# Patient Record
Sex: Male | Born: 1945 | Race: White | Hispanic: No | Marital: Married | Smoking: Former smoker
Health system: Southern US, Community
[De-identification: ages and names within clinical notes are randomized; demographics above are authoritative.]

## PROBLEM LIST (undated history)

## (undated) DIAGNOSIS — I219 Acute myocardial infarction, unspecified: Secondary | ICD-10-CM

## (undated) DIAGNOSIS — I5022 Chronic systolic (congestive) heart failure: Secondary | ICD-10-CM

## (undated) DIAGNOSIS — C8290 Follicular lymphoma, unspecified, unspecified site: Secondary | ICD-10-CM

## (undated) DIAGNOSIS — I2510 Atherosclerotic heart disease of native coronary artery without angina pectoris: Secondary | ICD-10-CM

## (undated) DIAGNOSIS — E785 Hyperlipidemia, unspecified: Secondary | ICD-10-CM

## (undated) DIAGNOSIS — I10 Essential (primary) hypertension: Secondary | ICD-10-CM

## (undated) DIAGNOSIS — I255 Ischemic cardiomyopathy: Secondary | ICD-10-CM

## (undated) HISTORY — PX: HERNIA REPAIR: SHX51

---
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
## 1990-12-07 DIAGNOSIS — I219 Acute myocardial infarction, unspecified: Secondary | ICD-10-CM

## 1990-12-07 HISTORY — DX: Acute myocardial infarction, unspecified: I21.9

## 2009-05-07 ENCOUNTER — Ambulatory Visit: Payer: Self-pay | Admitting: Oncology

## 2009-05-15 ENCOUNTER — Ambulatory Visit: Payer: Self-pay | Admitting: Oncology

## 2009-06-06 ENCOUNTER — Ambulatory Visit: Payer: Self-pay | Admitting: Oncology

## 2009-07-07 ENCOUNTER — Ambulatory Visit: Payer: Self-pay | Admitting: Oncology

## 2009-08-07 ENCOUNTER — Ambulatory Visit: Payer: Self-pay | Admitting: Oncology

## 2009-09-06 ENCOUNTER — Ambulatory Visit: Payer: Self-pay | Admitting: Oncology

## 2009-10-07 ENCOUNTER — Ambulatory Visit: Payer: Self-pay | Admitting: Oncology

## 2009-11-06 ENCOUNTER — Ambulatory Visit: Payer: Self-pay | Admitting: Oncology

## 2009-12-07 ENCOUNTER — Ambulatory Visit: Payer: Self-pay | Admitting: Oncology

## 2009-12-25 ENCOUNTER — Ambulatory Visit: Payer: Self-pay | Admitting: Oncology

## 2010-01-07 ENCOUNTER — Ambulatory Visit: Payer: Self-pay | Admitting: Oncology

## 2010-02-04 ENCOUNTER — Ambulatory Visit: Payer: Self-pay | Admitting: Oncology

## 2010-02-19 ENCOUNTER — Ambulatory Visit: Payer: Self-pay | Admitting: Oncology

## 2010-03-07 ENCOUNTER — Ambulatory Visit: Payer: Self-pay | Admitting: Oncology

## 2010-04-06 ENCOUNTER — Ambulatory Visit: Payer: Self-pay | Admitting: Oncology

## 2010-04-16 ENCOUNTER — Ambulatory Visit: Payer: Self-pay | Admitting: Oncology

## 2010-05-07 ENCOUNTER — Ambulatory Visit: Payer: Self-pay | Admitting: Oncology

## 2010-06-06 ENCOUNTER — Ambulatory Visit: Payer: Self-pay | Admitting: Oncology

## 2010-06-11 ENCOUNTER — Ambulatory Visit: Payer: Self-pay | Admitting: Oncology

## 2010-07-07 ENCOUNTER — Ambulatory Visit: Payer: Self-pay | Admitting: Oncology

## 2010-08-07 ENCOUNTER — Ambulatory Visit: Payer: Self-pay | Admitting: Oncology

## 2010-09-30 ENCOUNTER — Ambulatory Visit: Payer: Self-pay | Admitting: Oncology

## 2010-10-07 ENCOUNTER — Ambulatory Visit: Payer: Self-pay | Admitting: Oncology

## 2010-11-25 ENCOUNTER — Ambulatory Visit: Payer: Self-pay | Admitting: Oncology

## 2010-12-07 ENCOUNTER — Ambulatory Visit: Payer: Self-pay | Admitting: Oncology

## 2011-02-03 ENCOUNTER — Ambulatory Visit: Payer: Self-pay | Admitting: Oncology

## 2011-02-05 ENCOUNTER — Ambulatory Visit: Payer: Self-pay | Admitting: Oncology

## 2011-03-31 ENCOUNTER — Ambulatory Visit: Payer: Self-pay | Admitting: Oncology

## 2011-04-07 ENCOUNTER — Ambulatory Visit: Payer: Self-pay | Admitting: Oncology

## 2011-05-26 ENCOUNTER — Ambulatory Visit: Payer: Self-pay | Admitting: Oncology

## 2011-06-07 ENCOUNTER — Ambulatory Visit: Payer: Self-pay | Admitting: Oncology

## 2011-07-21 ENCOUNTER — Ambulatory Visit: Payer: Self-pay | Admitting: Oncology

## 2011-08-08 ENCOUNTER — Ambulatory Visit: Payer: Self-pay | Admitting: Oncology

## 2011-09-21 ENCOUNTER — Ambulatory Visit: Payer: Self-pay | Admitting: Oncology

## 2011-10-08 ENCOUNTER — Ambulatory Visit: Payer: Self-pay | Admitting: Oncology

## 2011-10-26 ENCOUNTER — Ambulatory Visit: Payer: Self-pay | Admitting: Oncology

## 2011-11-07 ENCOUNTER — Ambulatory Visit: Payer: Self-pay | Admitting: Oncology

## 2012-02-01 ENCOUNTER — Ambulatory Visit: Payer: Self-pay | Admitting: Oncology

## 2012-02-01 LAB — CBC CANCER CENTER
Basophil #: 0.1 x10 3/mm (ref 0.0–0.1)
Eosinophil #: 0.4 x10 3/mm (ref 0.0–0.7)
HCT: 46.7 % (ref 40.0–52.0)
Lymphocyte %: 21 %
MCH: 31.3 pg (ref 26.0–34.0)
Monocyte #: 0.9 x10 3/mm — ABNORMAL HIGH (ref 0.0–0.7)
Monocyte %: 10.4 %
Neutrophil #: 5.5 x10 3/mm (ref 1.4–6.5)
Neutrophil %: 63 %
Platelet: 206 x10 3/mm (ref 150–440)
RBC: 5.09 10*6/uL (ref 4.40–5.90)
RDW: 13.9 % (ref 11.5–14.5)

## 2012-02-01 LAB — COMPREHENSIVE METABOLIC PANEL
Albumin: 3.6 g/dL (ref 3.4–5.0)
Alkaline Phosphatase: 120 U/L (ref 50–136)
Anion Gap: 8 (ref 7–16)
Calcium, Total: 8.7 mg/dL (ref 8.5–10.1)
Co2: 30 mmol/L (ref 21–32)
EGFR (African American): >60
Glucose: 120 mg/dL — ABNORMAL HIGH (ref 65–99)
Osmolality: 286 (ref 275–301)
SGOT(AST): 14 U/L — ABNORMAL LOW (ref 15–37)
SGPT (ALT): 19 U/L

## 2012-02-01 LAB — SEDIMENTATION RATE: Erythrocyte Sed Rate: 5 mm/hr (ref 0–20)

## 2012-02-05 ENCOUNTER — Ambulatory Visit: Payer: Self-pay | Admitting: Oncology

## 2012-06-06 ENCOUNTER — Ambulatory Visit: Payer: Self-pay | Admitting: Oncology

## 2012-06-08 ENCOUNTER — Ambulatory Visit: Payer: Self-pay | Admitting: Oncology

## 2012-06-08 LAB — CBC CANCER CENTER
Basophil %: 1.2 %
Eosinophil %: 3.5 %
HGB: 15.4 g/dL (ref 13.0–18.0)
Lymphocyte %: 26.2 %
MCH: 30.5 pg (ref 26.0–34.0)
MCHC: 32.7 g/dL (ref 32.0–36.0)
Monocyte #: 1.1 x10 3/mm — ABNORMAL HIGH (ref 0.2–1.0)
Neutrophil %: 57.9 %
Platelet: 202 x10 3/mm (ref 150–440)
RDW: 14.1 % (ref 11.5–14.5)
WBC: 9.7 x10 3/mm (ref 3.8–10.6)

## 2012-06-08 LAB — COMPREHENSIVE METABOLIC PANEL
Albumin: 3.8 g/dL (ref 3.4–5.0)
Alkaline Phosphatase: 121 U/L (ref 50–136)
Anion Gap: 9 (ref 7–16)
Calcium, Total: 9.2 mg/dL (ref 8.5–10.1)
Glucose: 93 mg/dL (ref 65–99)
SGOT(AST): 14 U/L — ABNORMAL LOW (ref 15–37)
SGPT (ALT): 19 U/L
Sodium: 140 mmol/L (ref 136–145)
Total Protein: 7.1 g/dL (ref 6.4–8.2)

## 2012-06-08 LAB — LACTATE DEHYDROGENASE: LDH: 154 U/L (ref 87–241)

## 2012-07-07 ENCOUNTER — Ambulatory Visit: Payer: Self-pay | Admitting: Oncology

## 2012-10-10 ENCOUNTER — Ambulatory Visit: Payer: Self-pay | Admitting: Oncology

## 2012-10-10 LAB — COMPREHENSIVE METABOLIC PANEL
Alkaline Phosphatase: 125 U/L (ref 50–136)
BUN: 14 mg/dL (ref 7–18)
Bilirubin,Total: 0.5 mg/dL (ref 0.2–1.0)
Co2: 27 mmol/L (ref 21–32)
Creatinine: 1.2 mg/dL (ref 0.60–1.30)
EGFR (African American): >60
EGFR (Non-African Amer.): >60
Glucose: 96 mg/dL (ref 65–99)
Osmolality: 282 (ref 275–301)
Potassium: 4.4 mmol/L (ref 3.5–5.1)
SGPT (ALT): 18 U/L (ref 12–78)
Sodium: 141 mmol/L (ref 136–145)

## 2012-10-10 LAB — CBC CANCER CENTER
Basophil #: 0.1 x10 3/mm (ref 0.0–0.1)
Eosinophil #: 0.3 x10 3/mm (ref 0.0–0.7)
Lymphocyte #: 2.2 x10 3/mm (ref 1.0–3.6)
MCH: 31.1 pg (ref 26.0–34.0)
MCHC: 33.2 g/dL (ref 32.0–36.0)
MCV: 94 fL (ref 80–100)
Monocyte #: 1.2 x10 3/mm — ABNORMAL HIGH (ref 0.2–1.0)
Monocyte %: 13 %
Neutrophil #: 5.1 x10 3/mm (ref 1.4–6.5)
Neutrophil %: 57.4 %
Platelet: 194 x10 3/mm (ref 150–440)
RBC: 5.08 10*6/uL (ref 4.40–5.90)
RDW: 13.6 % (ref 11.5–14.5)
WBC: 9 x10 3/mm (ref 3.8–10.6)

## 2012-10-10 LAB — SEDIMENTATION RATE: Erythrocyte Sed Rate: 4 mm/hr (ref 0–20)

## 2012-11-06 ENCOUNTER — Ambulatory Visit: Payer: Self-pay | Admitting: Oncology

## 2013-04-03 ENCOUNTER — Ambulatory Visit: Payer: Self-pay | Admitting: Oncology

## 2013-04-06 ENCOUNTER — Ambulatory Visit: Payer: Self-pay | Admitting: Oncology

## 2013-04-10 LAB — CBC CANCER CENTER
Eosinophil #: 0.3 x10 3/mm (ref 0.0–0.7)
HCT: 46.8 % (ref 40.0–52.0)
HGB: 15.6 g/dL (ref 13.0–18.0)
MCHC: 33.3 g/dL (ref 32.0–36.0)
MCV: 90 fL (ref 80–100)
Monocyte #: 0.8 x10 3/mm (ref 0.2–1.0)
Monocyte %: 8.4 %
Neutrophil #: 5.7 x10 3/mm (ref 1.4–6.5)
Neutrophil %: 63.9 %
RBC: 5.19 10*6/uL (ref 4.40–5.90)
WBC: 8.9 x10 3/mm (ref 3.8–10.6)

## 2013-04-10 LAB — COMPREHENSIVE METABOLIC PANEL
Albumin: 3.8 g/dL (ref 3.4–5.0)
Alkaline Phosphatase: 111 U/L (ref 50–136)
Anion Gap: 12 (ref 7–16)
BUN: 13 mg/dL (ref 7–18)
Bilirubin,Total: 0.5 mg/dL (ref 0.2–1.0)
Calcium, Total: 9 mg/dL (ref 8.5–10.1)
Chloride: 102 mmol/L (ref 98–107)
Creatinine: 1.11 mg/dL (ref 0.60–1.30)
Glucose: 89 mg/dL (ref 65–99)
Potassium: 3.9 mmol/L (ref 3.5–5.1)
SGOT(AST): 11 U/L — ABNORMAL LOW (ref 15–37)
SGPT (ALT): 15 U/L (ref 12–78)
Total Protein: 6.7 g/dL (ref 6.4–8.2)

## 2013-04-10 LAB — SEDIMENTATION RATE: Erythrocyte Sed Rate: 4 mm/hr (ref 0–20)

## 2013-05-07 ENCOUNTER — Ambulatory Visit: Payer: Self-pay | Admitting: Oncology

## 2013-08-14 ENCOUNTER — Ambulatory Visit: Payer: Self-pay | Admitting: Oncology

## 2013-08-14 LAB — CBC CANCER CENTER
Basophil %: 1.1 %
Eosinophil #: 0.4 x10 3/mm (ref 0.0–0.7)
Eosinophil %: 4.3 %
HCT: 42.1 % (ref 40.0–52.0)
HGB: 14.5 g/dL (ref 13.0–18.0)
Lymphocyte #: 2.4 x10 3/mm (ref 1.0–3.6)
MCV: 91 fL (ref 80–100)
Monocyte #: 0.8 x10 3/mm (ref 0.2–1.0)
Monocyte %: 9.2 %
Neutrophil #: 5.3 x10 3/mm (ref 1.4–6.5)
Neutrophil %: 59.1 %
Platelet: 185 x10 3/mm (ref 150–440)
RDW: 14.2 % (ref 11.5–14.5)
WBC: 8.9 x10 3/mm (ref 3.8–10.6)

## 2013-08-14 LAB — COMPREHENSIVE METABOLIC PANEL
Albumin: 3.8 g/dL (ref 3.4–5.0)
BUN: 14 mg/dL (ref 7–18)
Chloride: 103 mmol/L (ref 98–107)
Glucose: 90 mg/dL (ref 65–99)
Potassium: 3.4 mmol/L — ABNORMAL LOW (ref 3.5–5.1)
SGOT(AST): 12 U/L — ABNORMAL LOW (ref 15–37)
SGPT (ALT): 14 U/L (ref 12–78)
Sodium: 141 mmol/L (ref 136–145)

## 2013-09-06 ENCOUNTER — Ambulatory Visit: Payer: Self-pay | Admitting: Oncology

## 2014-02-12 ENCOUNTER — Ambulatory Visit: Payer: Self-pay | Admitting: Oncology

## 2014-02-16 ENCOUNTER — Ambulatory Visit: Payer: Self-pay | Admitting: Oncology

## 2014-02-19 LAB — COMPREHENSIVE METABOLIC PANEL
ALK PHOS: 96 U/L
Albumin: 3.7 g/dL (ref 3.4–5.0)
Anion Gap: 3 — ABNORMAL LOW (ref 7–16)
BILIRUBIN TOTAL: 0.4 mg/dL (ref 0.2–1.0)
BUN: 12 mg/dL (ref 7–18)
CO2: 29 mmol/L (ref 21–32)
Calcium, Total: 8.9 mg/dL (ref 8.5–10.1)
Chloride: 105 mmol/L (ref 98–107)
Creatinine: 1.19 mg/dL (ref 0.60–1.30)
EGFR (African American): >60
Glucose: 94 mg/dL (ref 65–99)
OSMOLALITY: 273 (ref 275–301)
Potassium: 4.1 mmol/L (ref 3.5–5.1)
SGOT(AST): 11 U/L — ABNORMAL LOW (ref 15–37)
SGPT (ALT): 14 U/L (ref 12–78)
Sodium: 137 mmol/L (ref 136–145)
Total Protein: 6.9 g/dL (ref 6.4–8.2)

## 2014-02-19 LAB — CBC CANCER CENTER
BASOS ABS: 0.1 x10 3/mm (ref 0.0–0.1)
BASOS PCT: 1.1 %
EOS ABS: 0.4 x10 3/mm (ref 0.0–0.7)
Eosinophil %: 4.3 %
HCT: 44.3 % (ref 40.0–52.0)
HGB: 14.7 g/dL (ref 13.0–18.0)
LYMPHS PCT: 24.4 %
Lymphocyte #: 2.5 x10 3/mm (ref 1.0–3.6)
MCH: 29.4 pg (ref 26.0–34.0)
MCHC: 33.2 g/dL (ref 32.0–36.0)
MCV: 89 fL (ref 80–100)
Monocyte #: 1 x10 3/mm (ref 0.2–1.0)
Monocyte %: 9.3 %
NEUTROS ABS: 6.3 x10 3/mm (ref 1.4–6.5)
NEUTROS PCT: 60.9 %
Platelet: 201 x10 3/mm (ref 150–440)
RBC: 4.99 10*6/uL (ref 4.40–5.90)
RDW: 14.4 % (ref 11.5–14.5)
WBC: 10.3 x10 3/mm (ref 3.8–10.6)

## 2014-02-19 LAB — LACTATE DEHYDROGENASE: LDH: 165 U/L (ref 85–241)

## 2014-03-07 ENCOUNTER — Ambulatory Visit: Payer: Self-pay | Admitting: Oncology

## 2014-07-02 ENCOUNTER — Ambulatory Visit: Payer: Self-pay | Admitting: Oncology

## 2014-07-02 LAB — CBC CANCER CENTER
Basophil #: 0.2 x10 3/mm — ABNORMAL HIGH (ref 0.0–0.1)
Basophil %: 1.5 %
Eosinophil #: 0.3 x10 3/mm (ref 0.0–0.7)
Eosinophil %: 2.6 %
HCT: 41.5 % (ref 40.0–52.0)
HGB: 13.8 g/dL (ref 13.0–18.0)
LYMPHS ABS: 2.4 x10 3/mm (ref 1.0–3.6)
Lymphocyte %: 20.9 %
MCH: 28.8 pg (ref 26.0–34.0)
MCHC: 33.1 g/dL (ref 32.0–36.0)
MCV: 87 fL (ref 80–100)
Monocyte #: 1.2 x10 3/mm — ABNORMAL HIGH (ref 0.2–1.0)
Monocyte %: 10.2 %
Neutrophil #: 7.5 x10 3/mm — ABNORMAL HIGH (ref 1.4–6.5)
Neutrophil %: 64.8 %
Platelet: 219 x10 3/mm (ref 150–440)
RBC: 4.78 10*6/uL (ref 4.40–5.90)
RDW: 14.7 % — ABNORMAL HIGH (ref 11.5–14.5)
WBC: 11.6 x10 3/mm — ABNORMAL HIGH (ref 3.8–10.6)

## 2014-07-02 LAB — COMPREHENSIVE METABOLIC PANEL
ALBUMIN: 3.3 g/dL — AB (ref 3.4–5.0)
ALK PHOS: 99 U/L
ANION GAP: 11 (ref 7–16)
BILIRUBIN TOTAL: 0.5 mg/dL (ref 0.2–1.0)
BUN: 14 mg/dL (ref 7–18)
CO2: 27 mmol/L (ref 21–32)
Calcium, Total: 8.8 mg/dL (ref 8.5–10.1)
Chloride: 102 mmol/L (ref 98–107)
Creatinine: 1.08 mg/dL (ref 0.60–1.30)
EGFR (Non-African Amer.): >60
GLUCOSE: 94 mg/dL (ref 65–99)
Osmolality: 280 (ref 275–301)
Potassium: 3.7 mmol/L (ref 3.5–5.1)
SGOT(AST): 9 U/L — ABNORMAL LOW (ref 15–37)
SGPT (ALT): 14 U/L
Sodium: 140 mmol/L (ref 136–145)
Total Protein: 6.6 g/dL (ref 6.4–8.2)

## 2014-07-02 LAB — LACTATE DEHYDROGENASE: LDH: 126 U/L (ref 85–241)

## 2014-07-07 ENCOUNTER — Ambulatory Visit: Payer: Self-pay | Admitting: Oncology

## 2014-09-21 ENCOUNTER — Inpatient Hospital Stay: Payer: Self-pay | Admitting: Internal Medicine

## 2014-09-21 LAB — COMPREHENSIVE METABOLIC PANEL
AST: 19 U/L (ref 15–37)
Albumin: 3.2 g/dL — ABNORMAL LOW (ref 3.4–5.0)
Alkaline Phosphatase: 101 U/L
Anion Gap: 15 (ref 7–16)
BUN: 14 mg/dL (ref 7–18)
Bilirubin,Total: 0.5 mg/dL (ref 0.2–1.0)
Calcium, Total: 7.9 mg/dL — ABNORMAL LOW (ref 8.5–10.1)
Chloride: 107 mmol/L (ref 98–107)
Co2: 19 mmol/L — ABNORMAL LOW (ref 21–32)
Creatinine: 1.31 mg/dL — ABNORMAL HIGH (ref 0.60–1.30)
EGFR (Non-African Amer.): 58 — ABNORMAL LOW
GLUCOSE: 268 mg/dL — AB (ref 65–99)
Osmolality: 291 (ref 275–301)
Potassium: 3.1 mmol/L — ABNORMAL LOW (ref 3.5–5.1)
SGPT (ALT): 14 U/L
Sodium: 141 mmol/L (ref 136–145)
Total Protein: 6.7 g/dL (ref 6.4–8.2)

## 2014-09-21 LAB — TROPONIN I: Troponin-I: 0.02 ng/mL

## 2014-09-21 LAB — CBC
HCT: 43.7 % (ref 40.0–52.0)
HGB: 13.9 g/dL (ref 13.0–18.0)
MCH: 27.5 pg (ref 26.0–34.0)
MCHC: 31.8 g/dL — ABNORMAL LOW (ref 32.0–36.0)
MCV: 87 fL (ref 80–100)
PLATELETS: 294 10*3/uL (ref 150–440)
RBC: 5.04 10*6/uL (ref 4.40–5.90)
RDW: 16.3 % — AB (ref 11.5–14.5)
WBC: 18.2 10*3/uL — ABNORMAL HIGH (ref 3.8–10.6)

## 2014-09-21 LAB — CK TOTAL AND CKMB (NOT AT ARMC)
CK, Total: 61 U/L
CK-MB: 2.1 ng/mL (ref 0.5–3.6)

## 2014-09-22 LAB — URINALYSIS, COMPLETE
Bacteria: NONE SEEN
Bilirubin,UR: NEGATIVE
Blood: NEGATIVE
Glucose,UR: 50 mg/dL (ref 0–75)
Hyaline Cast: 8
KETONE: NEGATIVE
Leukocyte Esterase: NEGATIVE
Nitrite: NEGATIVE
PH: 5 (ref 4.5–8.0)
RBC,UR: 1 /HPF (ref 0–5)
Specific Gravity: 1.017 (ref 1.003–1.030)
Squamous Epithelial: NONE SEEN
WBC UR: 3 /HPF (ref 0–5)

## 2014-09-22 LAB — CBC WITH DIFFERENTIAL/PLATELET
BASOS PCT: 0.4 %
Basophil #: 0.1 10*3/uL (ref 0.0–0.1)
EOS PCT: 0.1 %
Eosinophil #: 0 10*3/uL (ref 0.0–0.7)
HCT: 43.6 % (ref 40.0–52.0)
HGB: 14 g/dL (ref 13.0–18.0)
LYMPHS PCT: 4.2 %
Lymphocyte #: 0.6 10*3/uL — ABNORMAL LOW (ref 1.0–3.6)
MCH: 27.4 pg (ref 26.0–34.0)
MCHC: 32 g/dL (ref 32.0–36.0)
MCV: 86 fL (ref 80–100)
MONO ABS: 0.3 x10 3/mm (ref 0.2–1.0)
MONOS PCT: 1.9 %
NEUTROS ABS: 14.1 10*3/uL — AB (ref 1.4–6.5)
NEUTROS PCT: 93.4 %
Platelet: 206 10*3/uL (ref 150–440)
RBC: 5.09 10*6/uL (ref 4.40–5.90)
RDW: 16.3 % — ABNORMAL HIGH (ref 11.5–14.5)
WBC: 15.1 10*3/uL — AB (ref 3.8–10.6)

## 2014-09-22 LAB — HEPARIN LEVEL (UNFRACTIONATED): Anti-Xa(Unfractionated): <0.1 IU/mL — ABNORMAL LOW (ref 0.30–0.70)

## 2014-09-22 LAB — PRO B NATRIURETIC PEPTIDE: B-TYPE NATIURETIC PEPTID: 5392 pg/mL — AB (ref 0–125)

## 2014-09-22 LAB — BASIC METABOLIC PANEL
ANION GAP: 12 (ref 7–16)
BUN: 15 mg/dL (ref 7–18)
CREATININE: 1.08 mg/dL (ref 0.60–1.30)
Calcium, Total: 8.2 mg/dL — ABNORMAL LOW (ref 8.5–10.1)
Chloride: 110 mmol/L — ABNORMAL HIGH (ref 98–107)
Co2: 21 mmol/L (ref 21–32)
EGFR (African American): >60
GLUCOSE: 130 mg/dL — AB (ref 65–99)
Osmolality: 288 (ref 275–301)
Potassium: 3.4 mmol/L — ABNORMAL LOW (ref 3.5–5.1)
Sodium: 143 mmol/L (ref 136–145)

## 2014-09-22 LAB — CK-MB
CK-MB: 2.4 ng/mL (ref 0.5–3.6)
CK-MB: 4.9 ng/mL — ABNORMAL HIGH (ref 0.5–3.6)
CK-MB: 8.7 ng/mL — ABNORMAL HIGH (ref 0.5–3.6)
CK-MB: 7.7 ng/mL — ABNORMAL HIGH (ref 0.5–3.6)
CK-MB: 7.1 ng/mL — AB (ref 0.5–3.6)

## 2014-09-22 LAB — TROPONIN I
TROPONIN-I: 0.43 ng/mL — AB
Troponin-I: 0.12 ng/mL — ABNORMAL HIGH

## 2014-09-22 LAB — PROTIME-INR
INR: 1.2
Prothrombin Time: 14.7 secs (ref 11.5–14.7)

## 2014-09-22 LAB — APTT: Activated PTT: 34.6 secs (ref 23.6–35.9)

## 2014-09-22 LAB — MAGNESIUM: MAGNESIUM: 2 mg/dL

## 2014-09-23 LAB — BASIC METABOLIC PANEL
Anion Gap: 9 (ref 7–16)
BUN: 21 mg/dL — AB (ref 7–18)
CHLORIDE: 106 mmol/L (ref 98–107)
CREATININE: 1.36 mg/dL — AB (ref 0.60–1.30)
Calcium, Total: 8.8 mg/dL (ref 8.5–10.1)
Co2: 27 mmol/L (ref 21–32)
EGFR (African American): >60
GFR CALC NON AF AMER: 56 — AB
Glucose: 92 mg/dL (ref 65–99)
Osmolality: 286 (ref 275–301)
POTASSIUM: 4.6 mmol/L (ref 3.5–5.1)
Sodium: 142 mmol/L (ref 136–145)

## 2014-09-23 LAB — CBC WITH DIFFERENTIAL/PLATELET
BASOS ABS: 0.2 10*3/uL — AB (ref 0.0–0.1)
Basophil %: 0.8 %
EOS PCT: 0.1 %
Eosinophil #: 0 10*3/uL (ref 0.0–0.7)
HCT: 43 % (ref 40.0–52.0)
HGB: 13.4 g/dL (ref 13.0–18.0)
LYMPHS ABS: 3.4 10*3/uL (ref 1.0–3.6)
Lymphocyte %: 16.7 %
MCH: 26.8 pg (ref 26.0–34.0)
MCHC: 31.1 g/dL — ABNORMAL LOW (ref 32.0–36.0)
MCV: 86 fL (ref 80–100)
MONO ABS: 1.7 x10 3/mm — AB (ref 0.2–1.0)
MONOS PCT: 8.3 %
NEUTROS PCT: 74.1 %
Neutrophil #: 14.9 10*3/uL — ABNORMAL HIGH (ref 1.4–6.5)
Platelet: 239 10*3/uL (ref 150–440)
RBC: 4.99 10*6/uL (ref 4.40–5.90)
RDW: 16.2 % — ABNORMAL HIGH (ref 11.5–14.5)
WBC: 20.2 10*3/uL — AB (ref 3.8–10.6)

## 2014-09-26 LAB — CULTURE, BLOOD (SINGLE)

## 2014-10-15 ENCOUNTER — Observation Stay: Payer: Self-pay | Admitting: Specialist

## 2014-10-15 LAB — CK TOTAL AND CKMB (NOT AT ARMC)
CK, TOTAL: 25 U/L — AB
CK-MB: 0.6 ng/mL (ref 0.5–3.6)

## 2014-10-15 LAB — COMPREHENSIVE METABOLIC PANEL
ALBUMIN: 3.3 g/dL — AB (ref 3.4–5.0)
ALK PHOS: 88 U/L
ANION GAP: 6 — AB (ref 7–16)
BILIRUBIN TOTAL: 0.4 mg/dL (ref 0.2–1.0)
BUN: 20 mg/dL — ABNORMAL HIGH (ref 7–18)
CALCIUM: 9 mg/dL (ref 8.5–10.1)
CHLORIDE: 104 mmol/L (ref 98–107)
CREATININE: 1.34 mg/dL — AB (ref 0.60–1.30)
Co2: 27 mmol/L (ref 21–32)
EGFR (African American): >60
EGFR (Non-African Amer.): 57 — ABNORMAL LOW
Glucose: 90 mg/dL (ref 65–99)
Osmolality: 276 (ref 275–301)
Potassium: 5.5 mmol/L — ABNORMAL HIGH (ref 3.5–5.1)
SGOT(AST): 8 U/L — ABNORMAL LOW (ref 15–37)
SGPT (ALT): 19 U/L
Sodium: 137 mmol/L (ref 136–145)
Total Protein: 6.4 g/dL (ref 6.4–8.2)

## 2014-10-15 LAB — CBC
HCT: 41.1 % (ref 40.0–52.0)
HGB: 13.6 g/dL (ref 13.0–18.0)
MCH: 28.3 pg (ref 26.0–34.0)
MCHC: 33.1 g/dL (ref 32.0–36.0)
MCV: 86 fL (ref 80–100)
PLATELETS: 204 10*3/uL (ref 150–440)
RBC: 4.8 10*6/uL (ref 4.40–5.90)
RDW: 16.1 % — ABNORMAL HIGH (ref 11.5–14.5)
WBC: 11.8 10*3/uL — ABNORMAL HIGH (ref 3.8–10.6)

## 2014-10-15 LAB — APTT: Activated PTT: 34.2 secs (ref 23.6–35.9)

## 2014-10-15 LAB — TROPONIN I: Troponin-I: <0.02 ng/mL

## 2014-10-15 LAB — PRO B NATRIURETIC PEPTIDE: B-Type Natriuretic Peptide: 702 pg/mL — ABNORMAL HIGH (ref 0–125)

## 2014-10-15 LAB — PROTIME-INR
INR: 1.1
Prothrombin Time: 13.8 secs (ref 11.5–14.7)

## 2014-10-16 ENCOUNTER — Inpatient Hospital Stay (HOSPITAL_COMMUNITY)
Admission: AD | Admit: 2014-10-16 | Discharge: 2014-10-24 | DRG: 236 | Disposition: A | Discharge status: Home / Self Care | Payer: Medicare HMO | Source: Other Acute Inpatient Hospital | Attending: Cardiothoracic Surgery | Admitting: Cardiothoracic Surgery

## 2014-10-16 ENCOUNTER — Encounter (HOSPITAL_COMMUNITY): Payer: Medicare HMO

## 2014-10-16 ENCOUNTER — Inpatient Hospital Stay (HOSPITAL_COMMUNITY): Payer: Medicare HMO

## 2014-10-16 ENCOUNTER — Other Ambulatory Visit: Payer: Medicare HMO | Admitting: *Deleted

## 2014-10-16 ENCOUNTER — Encounter (HOSPITAL_COMMUNITY): Payer: Self-pay | Admitting: Anesthesiology

## 2014-10-16 DIAGNOSIS — Z87891 Personal history of nicotine dependence: Secondary | ICD-10-CM

## 2014-10-16 DIAGNOSIS — I10 Essential (primary) hypertension: Secondary | ICD-10-CM | POA: Diagnosis present

## 2014-10-16 DIAGNOSIS — Z79899 Other long term (current) drug therapy: Secondary | ICD-10-CM

## 2014-10-16 DIAGNOSIS — I714 Abdominal aortic aneurysm, without rupture: Secondary | ICD-10-CM | POA: Diagnosis present

## 2014-10-16 DIAGNOSIS — R001 Bradycardia, unspecified: Secondary | ICD-10-CM | POA: Diagnosis present

## 2014-10-16 DIAGNOSIS — Z7982 Long term (current) use of aspirin: Secondary | ICD-10-CM

## 2014-10-16 DIAGNOSIS — E785 Hyperlipidemia, unspecified: Secondary | ICD-10-CM | POA: Diagnosis present

## 2014-10-16 DIAGNOSIS — I5022 Chronic systolic (congestive) heart failure: Secondary | ICD-10-CM | POA: Diagnosis present

## 2014-10-16 DIAGNOSIS — Z951 Presence of aortocoronary bypass graft: Secondary | ICD-10-CM

## 2014-10-16 DIAGNOSIS — K5900 Constipation, unspecified: Secondary | ICD-10-CM | POA: Diagnosis not present

## 2014-10-16 DIAGNOSIS — Z8249 Family history of ischemic heart disease and other diseases of the circulatory system: Secondary | ICD-10-CM | POA: Diagnosis not present

## 2014-10-16 DIAGNOSIS — R0989 Other specified symptoms and signs involving the circulatory and respiratory systems: Secondary | ICD-10-CM | POA: Diagnosis present

## 2014-10-16 DIAGNOSIS — I2510 Atherosclerotic heart disease of native coronary artery without angina pectoris: Secondary | ICD-10-CM | POA: Diagnosis present

## 2014-10-16 DIAGNOSIS — I6529 Occlusion and stenosis of unspecified carotid artery: Secondary | ICD-10-CM | POA: Diagnosis not present

## 2014-10-16 DIAGNOSIS — I517 Cardiomegaly: Secondary | ICD-10-CM

## 2014-10-16 DIAGNOSIS — J449 Chronic obstructive pulmonary disease, unspecified: Secondary | ICD-10-CM | POA: Diagnosis present

## 2014-10-16 DIAGNOSIS — R55 Syncope and collapse: Secondary | ICD-10-CM

## 2014-10-16 DIAGNOSIS — R0601 Orthopnea: Secondary | ICD-10-CM | POA: Diagnosis present

## 2014-10-16 DIAGNOSIS — I2582 Chronic total occlusion of coronary artery: Secondary | ICD-10-CM | POA: Diagnosis present

## 2014-10-16 DIAGNOSIS — I252 Old myocardial infarction: Secondary | ICD-10-CM | POA: Diagnosis not present

## 2014-10-16 DIAGNOSIS — J9811 Atelectasis: Secondary | ICD-10-CM

## 2014-10-16 DIAGNOSIS — I255 Ischemic cardiomyopathy: Secondary | ICD-10-CM | POA: Diagnosis present

## 2014-10-16 DIAGNOSIS — E119 Type 2 diabetes mellitus without complications: Secondary | ICD-10-CM | POA: Diagnosis present

## 2014-10-16 DIAGNOSIS — D696 Thrombocytopenia, unspecified: Secondary | ICD-10-CM | POA: Diagnosis not present

## 2014-10-16 DIAGNOSIS — I739 Peripheral vascular disease, unspecified: Secondary | ICD-10-CM | POA: Diagnosis present

## 2014-10-16 DIAGNOSIS — D62 Acute posthemorrhagic anemia: Secondary | ICD-10-CM | POA: Diagnosis not present

## 2014-10-16 DIAGNOSIS — I25111 Atherosclerotic heart disease of native coronary artery with angina pectoris with documented spasm: Secondary | ICD-10-CM

## 2014-10-16 HISTORY — DX: Essential (primary) hypertension: I10

## 2014-10-16 HISTORY — DX: Chronic systolic (congestive) heart failure: I50.22

## 2014-10-16 HISTORY — DX: Hyperlipidemia, unspecified: E78.5

## 2014-10-16 HISTORY — DX: Acute myocardial infarction, unspecified: I21.9

## 2014-10-16 HISTORY — DX: Ischemic cardiomyopathy: I25.5

## 2014-10-16 HISTORY — DX: Atherosclerotic heart disease of native coronary artery without angina pectoris: I25.10

## 2014-10-16 HISTORY — DX: Follicular lymphoma, unspecified, unspecified site: C82.90

## 2014-10-16 LAB — CBC WITH DIFFERENTIAL/PLATELET
Basophil #: 0.1 10*3/uL (ref 0.0–0.1)
Basophil %: 0.8 %
EOS PCT: 3.5 %
Eosinophil #: 0.4 10*3/uL (ref 0.0–0.7)
HCT: 36.4 % — AB (ref 40.0–52.0)
HGB: 12.1 g/dL — ABNORMAL LOW (ref 13.0–18.0)
Lymphocyte #: 2.7 10*3/uL (ref 1.0–3.6)
Lymphocyte %: 25.4 %
MCH: 28.4 pg (ref 26.0–34.0)
MCHC: 33.2 g/dL (ref 32.0–36.0)
MCV: 85 fL (ref 80–100)
Monocyte #: 1.1 x10 3/mm — ABNORMAL HIGH (ref 0.2–1.0)
Monocyte %: 10.2 %
NEUTROS ABS: 6.3 10*3/uL (ref 1.4–6.5)
Neutrophil %: 60.1 %
PLATELETS: 186 10*3/uL (ref 150–440)
RBC: 4.26 10*6/uL — ABNORMAL LOW (ref 4.40–5.90)
RDW: 15.9 % — ABNORMAL HIGH (ref 11.5–14.5)
WBC: 10.4 10*3/uL (ref 3.8–10.6)

## 2014-10-16 LAB — CBC
HCT: 40.7 % (ref 39.0–52.0)
Hemoglobin: 13.6 g/dL (ref 13.0–17.0)
MCH: 28.5 pg (ref 26.0–34.0)
MCHC: 33.4 g/dL (ref 30.0–36.0)
MCV: 85.1 fL (ref 78.0–100.0)
Platelets: 197 10*3/uL (ref 150–400)
RBC: 4.78 MIL/uL (ref 4.22–5.81)
RDW: 15.1 % (ref 11.5–15.5)
WBC: 9.1 10*3/uL (ref 4.0–10.5)

## 2014-10-16 LAB — BASIC METABOLIC PANEL
ANION GAP: 8 (ref 7–16)
BUN: 22 mg/dL — AB (ref 7–18)
CHLORIDE: 105 mmol/L (ref 98–107)
Calcium, Total: 8.4 mg/dL — ABNORMAL LOW (ref 8.5–10.1)
Co2: 26 mmol/L (ref 21–32)
Creatinine: 1.32 mg/dL — ABNORMAL HIGH (ref 0.60–1.30)
GFR CALC NON AF AMER: 58 — AB
Glucose: 87 mg/dL (ref 65–99)
OSMOLALITY: 280 (ref 275–301)
Potassium: 4.4 mmol/L (ref 3.5–5.1)
SODIUM: 139 mmol/L (ref 136–145)

## 2014-10-16 LAB — COMPREHENSIVE METABOLIC PANEL
ALT: 11 U/L (ref 0–53)
AST: 13 U/L (ref 0–37)
Albumin: 3.6 g/dL (ref 3.5–5.2)
Alkaline Phosphatase: 100 U/L (ref 39–117)
Anion gap: 17 — ABNORMAL HIGH (ref 5–15)
BUN: 22 mg/dL (ref 6–23)
CO2: 24 mEq/L (ref 19–32)
Calcium: 9.3 mg/dL (ref 8.4–10.5)
Chloride: 99 mEq/L (ref 96–112)
Creatinine, Ser: 1.19 mg/dL (ref 0.50–1.35)
GFR calc Af Amer: 71 mL/min — ABNORMAL LOW (ref >90)
GFR calc non Af Amer: 61 mL/min — ABNORMAL LOW (ref >90)
Glucose, Bld: 142 mg/dL — ABNORMAL HIGH (ref 70–99)
Potassium: 4.4 mEq/L (ref 3.7–5.3)
Sodium: 140 mEq/L (ref 137–147)
Total Bilirubin: 0.5 mg/dL (ref 0.3–1.2)
Total Protein: 7 g/dL (ref 6.0–8.3)

## 2014-10-16 LAB — SURGICAL PCR SCREEN
MRSA, PCR: NEGATIVE
Staphylococcus aureus: NEGATIVE

## 2014-10-16 LAB — TSH: TSH: 2.71 u[IU]/mL (ref 0.350–4.500)

## 2014-10-16 LAB — APTT: aPTT: 35 seconds (ref 24–37)

## 2014-10-16 LAB — MAGNESIUM: Magnesium: 2.2 mg/dL (ref 1.5–2.5)

## 2014-10-16 LAB — PROTIME-INR
INR: 1.09 (ref 0.00–1.49)
Prothrombin Time: 14.3 seconds (ref 11.6–15.2)

## 2014-10-16 MED ORDER — DOCUSATE SODIUM 100 MG PO CAPS
100.0000 mg | ORAL_CAPSULE | Freq: Two times a day (BID) | ORAL | Status: DC
  Administered 2014-10-16 – 2014-10-18 (×4): 100 mg via ORAL
  Filled 2014-10-16 (×7): qty 1

## 2014-10-16 MED ORDER — MAGNESIUM CITRATE PO SOLN
1.0000 | Freq: Once | ORAL | Status: AC | PRN
  Filled 2014-10-16: qty 296

## 2014-10-16 MED ORDER — BUDESONIDE-FORMOTEROL FUMARATE 160-4.5 MCG/ACT IN AERO
2.0000 | INHALATION_SPRAY | Freq: Two times a day (BID) | RESPIRATORY_TRACT | Status: DC
  Administered 2014-10-16 – 2014-10-18 (×3): 2 via RESPIRATORY_TRACT
  Filled 2014-10-16: qty 6

## 2014-10-16 MED ORDER — SPIRONOLACTONE 25 MG PO TABS
25.0000 mg | ORAL_TABLET | Freq: Every day | ORAL | Status: DC
  Administered 2014-10-17 – 2014-10-18 (×2): 25 mg via ORAL
  Filled 2014-10-16 (×4): qty 1

## 2014-10-16 MED ORDER — SODIUM CHLORIDE 0.9 % IJ SOLN
3.0000 mL | Freq: Two times a day (BID) | INTRAMUSCULAR | Status: DC
  Administered 2014-10-16 – 2014-10-18 (×3): 3 mL via INTRAVENOUS

## 2014-10-16 MED ORDER — ATORVASTATIN CALCIUM 80 MG PO TABS
80.0000 mg | ORAL_TABLET | Freq: Every day | ORAL | Status: DC
Start: 2014-10-16 — End: 2014-10-21
  Administered 2014-10-17 – 2014-10-20 (×3): 80 mg via ORAL
  Filled 2014-10-16 (×6): qty 1

## 2014-10-16 MED ORDER — ACETAMINOPHEN 650 MG RE SUPP: 650.0000 mg | Freq: Four times a day (QID) | RECTAL | Status: DC | PRN

## 2014-10-16 MED ORDER — HYDROCODONE-ACETAMINOPHEN 5-325 MG PO TABS: 1.0000 | ORAL_TABLET | ORAL | Status: DC | PRN

## 2014-10-16 MED ORDER — ADULT MULTIVITAMIN W/MINERALS CH
1.0000 | ORAL_TABLET | Freq: Every day | ORAL | Status: DC
  Administered 2014-10-17 – 2014-10-24 (×7): 1 via ORAL
  Filled 2014-10-16 (×9): qty 1

## 2014-10-16 MED ORDER — SODIUM CHLORIDE 0.9 % IJ SOLN
3.0000 mL | Freq: Two times a day (BID) | INTRAMUSCULAR | Status: DC
  Administered 2014-10-17 – 2014-10-18 (×4): 3 mL via INTRAVENOUS

## 2014-10-16 MED ORDER — ALUM & MAG HYDROXIDE-SIMETH 200-200-20 MG/5ML PO SUSP: 30.0000 mL | Freq: Four times a day (QID) | ORAL | Status: DC | PRN

## 2014-10-16 MED ORDER — GUAIFENESIN-DM 100-10 MG/5ML PO SYRP: 5.0000 mL | ORAL_SOLUTION | ORAL | Status: DC | PRN

## 2014-10-16 MED ORDER — ACETAMINOPHEN 325 MG PO TABS: 650.0000 mg | ORAL_TABLET | Freq: Four times a day (QID) | ORAL | Status: DC | PRN

## 2014-10-16 MED ORDER — ASPIRIN EC 81 MG PO TBEC
81.0000 mg | DELAYED_RELEASE_TABLET | Freq: Every day | ORAL | Status: DC
  Administered 2014-10-17 – 2014-10-18 (×2): 81 mg via ORAL
  Filled 2014-10-16 (×4): qty 1

## 2014-10-16 MED ORDER — ATORVASTATIN CALCIUM 40 MG PO TABS
40.0000 mg | ORAL_TABLET | Freq: Every day | ORAL | Status: DC
  Filled 2014-10-16: qty 1

## 2014-10-16 MED ORDER — SODIUM CHLORIDE 0.9 % IV SOLN
250.0000 mL | INTRAVENOUS | Status: DC | PRN
  Administered 2014-10-19: 12:00:00 via INTRAVENOUS

## 2014-10-16 MED ORDER — ZOLPIDEM TARTRATE 5 MG PO TABS: 5.0000 mg | ORAL_TABLET | Freq: Every evening | ORAL | Status: DC | PRN

## 2014-10-16 MED ORDER — LISINOPRIL 5 MG PO TABS
5.0000 mg | ORAL_TABLET | Freq: Every day | ORAL | Status: DC
  Administered 2014-10-17 – 2014-10-18 (×2): 5 mg via ORAL
  Filled 2014-10-16 (×3): qty 1

## 2014-10-16 MED ORDER — ENOXAPARIN SODIUM 40 MG/0.4ML ~~LOC~~ SOLN
40.0000 mg | SUBCUTANEOUS | Status: DC
  Administered 2014-10-17 – 2014-10-18 (×2): 40 mg via SUBCUTANEOUS
  Filled 2014-10-16 (×4): qty 0.4

## 2014-10-16 MED ORDER — IOHEXOL 300 MG/ML  SOLN
80.0000 mL | Freq: Once | INTRAMUSCULAR | Status: AC | PRN
  Administered 2014-10-16: 80 mL via INTRAVENOUS

## 2014-10-16 MED ORDER — CARVEDILOL 6.25 MG PO TABS
6.2500 mg | ORAL_TABLET | Freq: Two times a day (BID) | ORAL | Status: DC
  Filled 2014-10-16 (×4): qty 1

## 2014-10-16 MED ORDER — BISACODYL 5 MG PO TBEC: 5.0000 mg | DELAYED_RELEASE_TABLET | Freq: Every day | ORAL | Status: DC | PRN

## 2014-10-16 MED ORDER — SODIUM CHLORIDE 0.9 % IJ SOLN: 3.0000 mL | INTRAMUSCULAR | Status: DC | PRN

## 2014-10-16 NOTE — Consult Note (Signed)
[name] Medical Record #[id number] Date of Birth: 24-Jun-1946  Referring: No ref. provider found Primary Care: Craig Common, MD  Chief Complaint:   No chief complaint on file.    Patient examined, coronary angiogram and outside medical records reviewed. 68 year old Caucasian male with ischemic cardiomyopathy and recent presyncopal episode  History of Present Illness:    The patient's cardiac history began in 1992 when he was treated for MI at [facility] records available but patient states he was given a "clot buster" injection at the time of catheterization.He subsequently received outpatient follow-up for few years from [facility] and was on aspirin lisinopril atenolol and a lipid agent.He then transitioned his care to internal medicine at [place].    The patient is an active smoker-50-pack-year history- with chronic heart failure managed medically recently was hospitalized 3 weeks ago with orthopnea PND and was treated for acute on chronic heart failure. Cardiac enzymes were negative and he had no chest pain. He subsequently underwent cardiology evaluation at [facility] including cardiac CT, myocardial perfusion test and was scheduled for cardiac catheterization. While leaving the cardiology office he had a presyncopal episode with generalized loss of strength, loss of speech and no recall of the event. He was bradycardic at the time from taking Coreg and Entresto. He was admitted to the hospital and underwent cardiac catheterization yesterday. This demonstrated 80% left main stenosis, 90-95 percent LAD diagonal stenosis with graftable vessels and chronic occlusion of the RCA and circumflex with poor targets for surgical revascularization. LVEDP is not available. Ejection fraction 25%, global dysfunction.  Echocardiogram was performed but results are not  available and the study is pending here. Patient is currently clinically stable and was transferred for evaluation of surgical coronary revascularization-ischemic cardiomyopathy with EF 25% and suboptimal coronary targets for grafting Current Activity/ Functional Status: Patient still works part-time driving auto parts for an [employer] Patient lives with his wife Patient stopped smoking 3 weeks ago after he was hospitalized for heart failure but has a 50-pack-year history. Patient has never been a heavy drinker   Zubrod Score: At the time of surgery this patient's most appropriate activity status/level should be described as: []     0    Normal activity, no symptoms []     1    Restricted in physical strenuous activity but ambulatory, able to do out light work [x]     2    Ambulatory and capable of self care, unable to do work activities, up and about                 more than 50%  Of the time                            []     3    Only limited self care, in bed greater than 50% of waking hours []     4    Completely disabled, no self care, confined to bed or chair []     5    Moribund  Past medical history  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  History  Smoking status  . Not on file  Smokeless tobacco  . Not on file    History  Alcohol Use: Not on file    History   Social History  . Marital Status: Married    Spouse Name: N/A    Number of Children: N/A  . Years of Education: N/A   Occupational History  . Not on file.   Social History Main Topics  . Smoking status: Not on file  . Smokeless tobacco: Not on file  . Alcohol Use: Not on file  . Drug Use: Not on file  . Sexual Activity: Not on file    Other Topics Concern  . Not on file   Social History Narrative  . No narrative on file  heavy smoking history minimal alcohol history  Allergies not on fileno known drug allergies  Current Facility-Administered Medications  Medication Dose Route Frequency Provider Last Rate Last Dose  . 0.9 %  sodium chloride infusion  250 mL Intravenous PRN Ivin Poot, MD      . acetaminophen (TYLENOL) tablet 650 mg  650 mg Oral Q6H PRN Ivin Poot, MD       Or  . acetaminophen (TYLENOL) suppository 650 mg  650 mg Rectal Q6H PRN Ivin Poot, MD      . alum & mag hydroxide-simeth (MAALOX/MYLANTA) 200-200-20 MG/5ML suspension 30 mL  30 mL Oral Q6H PRN Ivin Poot, MD      . aspirin EC tablet 81 mg  81 mg Oral Daily Ivin Poot, MD      . atorvastatin (LIPITOR) tablet 40 mg  40 mg Oral q1800 Ivin Poot, MD      . bisacodyl (DULCOLAX) EC tablet 5 mg  5 mg Oral Daily PRN Ivin Poot, MD      . budesonide-formoterol [facility]) 160-4.5 MCG/ACT inhaler 2 puff  2 puff Inhalation BID Ivin Poot, MD      . carvedilol (COREG) tablet 6.25 mg  6.25 mg Oral BID WC Ivin Poot, MD      . docusate sodium (COLACE) capsule 100 mg  100 mg Oral BID Ivin Poot, MD      . enoxaparin (LOVENOX) injection 40 mg  40 mg Subcutaneous Q24H Ivin Poot, MD      . guaiFENesin-dextromethorphan [facility]) 100-10 MG/5ML syrup 5 mL  5 mL Oral Q4H PRN Ivin Poot, MD      . HYDROcodone-acetaminophen (NORCO/VICODIN) 5-325 MG per tablet 1-2 tablet  1-2 tablet Oral Q4H PRN Ivin Poot, MD      . Derrill Memo ON 10/17/2014] lisinopril (PRINIVIL,ZESTRIL) tablet 5 mg  5 mg Oral Daily Ivin Poot, MD      . magnesium citrate solution 1 Bottle  1 Bottle Oral Once PRN Ivin Poot, MD      . multivitamin with minerals tablet 1 tablet  1 tablet Oral Daily Ivin Poot, MD      . sodium chloride 0.9 % injection 3 mL  3 mL Intravenous Q12H Ivin Poot, MD      . sodium chloride  0.9 % injection 3 mL  3 mL Intravenous Q12H Ivin Poot, MD      . sodium chloride 0.9 % injection 3 mL  3 mL Intravenous PRN Ivin Poot, MD      .  spironolactone (ALDACTONE) tablet 25 mg  25 mg Oral Daily Ivin Poot, MD      . zolpidem [facility]) tablet 5 mg  5 mg Oral QHS PRN Ivin Poot, MD        No prescriptions prior to admission    No family history on file.mother had CABG, sibling had cerebral aneurysm, father died from hepatic cirrhosis   Review of Systems:   No history of thoracic trauma, rib fracture or pneumothorax Positive history of right calf claudication without rest pain Negative history of DVT or varicose veins The patient is right-hand dominant No previous major surgical procedures or general anesthesia Patient was hospitalized for pneumonia 10 days at [facility] approximately 10 years ago-the patient was not on a ventilator Patient was treated with chemotherapy for the follicular lymphoma-no radiation    Cardiac Review of Systems: Y or N  Chest Pain [    ]  Resting SOB [  noyes ] Exertional SOB  [  yes]  Orthopnea [ yes ]   Pedal Edema [no   ]    Palpitations [no  ] Syncope  [no  ]   Presyncope  yes[   ]  General Review of Systems: [Y] = yes [  ]=no Constitional: recent weight change [  ]; anorexia [  ]; fatigue [place].Blacker ]; nausea [  ]; night sweats [  ]; fever [  ]; or chills [  ]                                                               Dental: poor dentition[no  ]; Last Dentist visit: full dental plates  Eye : blurred vision [  ]; diplopia [   ]; vision changes [  ];  Amaurosis fugax[  ]; Resp: cough [  ];  wheezing[  ];  hemoptysis[  ]; shortness of breath[  ]; paroxysmal nocturnal dyspnea[  ]; dyspnea on exertion[  ]; or orthopnea[  ];  GI:  gallstones[  ], vomiting[  ];  dysphagia[  ]; melena[  ];  hematochezia [  ]; heartburn[  ];   Hx of  Colonoscopy[  ]; GU: kidney stones [  ]; hematuria[  ];   dysuria [  ];  nocturia[  ];  history of      obstruction [  ]; urinary frequency [  ]             Skin: rash, swelling[  ];, hair loss[  ];  peripheral edema[  ];  or itching[  ]; Musculosketetal: myalgias[  ];  joint swelling[  ];  joint erythema[  ];  joint pain[  ];  back pain[  ];  Heme/Lymph: bruising[  ];  bleeding[  ];  anemia[  ];  Neuro: TIA[  ];  headaches[  ];  stroke[  ];  vertigo[  ];  seizures[  ];   paresthesias[  ];  difficulty walking[  ];  Psych:depression[  ]; anxiety[  ];  Endocrine: diabetesno[  ];  thyroid dysfunction[  ];  Immunizations: Flu [  ]; Pneumococcal[  ];  Other:  Physical Exam: BP 108/62 mmHg  Pulse 55  Temp(Src) 97.8 F (36.6 C) (Oral)  Resp 18  SpO2 98%  Physical exam Patient is normal-sized BSA  2.0 alert and comfortable HEENT normocephalic pupils equal full upper and lower dental plates Neck without JVD  or mass, positive left carotid bruit Thorax without tenderness or deformity, breath sounds distant but clear Cardiac with regular rhythm soft right upper sternal systolic murmur Abdomen nondistended without mass, organomegaly, no pulsatile mass Extremities without cyanosis, mild clubbing present Vascular-palpable radial and femoral pulses, nonpalpable pedal pulses bilaterally Neurologic-alert and oriented, no focal motor deficit  Diagnostic Studies & Laboratory data:   Coronary angiograms reviewed demonstrating EF 25% with high-grade left main stenosis high-grade LAD-diagonal stenosis and chronic occlusion of circumflex and right coronary which are both atretic   Recent Radiology Findings:   PET-CT scan of abdomen and pelvis performed earlier this year shows no evidence recurrent lymphoma. The aortic arch has calcification. No pulmonary masses. There is a left renal cyst. Abdominal aorta measures 3 cm    Recent Lab Findings: No results found for: WBC, HGB, HCT, PLT, GLUCOSE, CHOL, TRIG, HDL, LDLDIRECT, LDLCALC, ALT, AST, NA, K, CL, CREATININE, BUN, CO2, TSH, INR, GLUF,  HGBA1C    Assessment / Plan:     1 ischemic cardiomyopathy with severe three-vessel CAD, EF 25%, LAD system with graftable targets but circumflex and RCA systems with poor targets  2  Recent syncopal episode with left carotid bruit, also documented heart rate less than 40-head CT scan pending, carotid Dopplers pending  3 COPD with previous hospitalization for pneumonia not requiring ventilator-PFTs pending  4  Peripheral ascular disease with absent pedal pulses and claudication symptoms of right leg-pre-CABG Dopplers pending  We'll proceed with pre-CABG evaluation. Will need echocardiogram to assess LV function and possibly right heart catheterization pending cardiology input-LVEDP not in available records. .     @ME1 @ 10/16/2014 2:06 PM

## 2014-10-16 NOTE — Consult Note (Signed)
Advanced Heart Failure Team History and Physical Note   Primary Physician: Dr. Luan Pulling  Primary Cardiologist: Dr. Humphrey Rolls   Reason for Admission: CAD   HPI:    Craig Page is a 68 yo male with a history of ICM (MI 1992 treated with thrombolytics), HTN, HLD, PAD, 3V CAD and newly diagnosed HF.   Admitted to [facility] about 3 weeks ago after having SOB that was acute. Denied any CP. ECHO showed EF 25-30%. He was diuresed and then sent home. Had follow up with stress testing that showed ischemia and underwent cardiac CT with significant CAD. 1 week later he had a syncopal episode with dizziness and underwent LHC showing 3V CAD.   LHC [facility]: EF 25%, R dominant, Lmain: 90%, LCx: 90% prox and 90% mid, RCA: completely occluded.  Denies CP, orthopnea or SOB. Complaints of leg pain with walking R>>L. (claudication at < 0.25 miles)   Review of Systems: [y] = yes, [ ]  = no   General: Weight gain [ ] ; Weight loss [ N]; Anorexia [ ] ; Fatigue Aqua.Slicker ]; Fever [ ] ; Chills [ ] ; Weakness [ ]   Cardiac: Chest pain/pressure [ ] ; Resting SOB [ N]; Exertional SOB [ N]; Orthopnea [ N]; Pedal Edema [ N]; Palpitations [ ] ; Syncope [ ] ; Presyncope [ ] ; Paroxysmal nocturnal dyspnea[ ]   Pulmonary: Cough Jazmín.Cullens ]; Wheezing[ ] ; Hemoptysis[ ] ; Sputum [ ] ; Snoring [ ]   GI: Vomiting[ ] ; Dysphagia[ ] ; Melena[ ] ; Hematochezia [ ] ; Heartburn[ ] ; Abdominal pain [ ] ; Constipation [ ] ; Diarrhea [ ] ; BRBPR [ ]   GU: Hematuria[ ] ; Dysuria [ ] ; Nocturia[ ]   Vascular: Pain in legs with walking [ Y]; Pain in feet with lying flat [ ] ; Non-healing sores [ ] ; Stroke [ ] ; TIA [ ] ; Slurred speech [ ] ;  Neuro: Headaches[ ] ; Vertigo[ ] ; Seizures[ ] ; Paresthesias[ ] ;Blurred vision [ ] ; Diplopia [ ] ; Vision changes [ ]   Ortho/Skin: Arthritis [ ] ; Joint pain [ Y]; Muscle pain [ ] ; Joint swelling [ ] ; Back Pain [ ] ; Rash [ ]   Psych: Depression[ N]; Anxiety[ ]   Heme: Bleeding problems [ ] ; Clotting disorders [ ] ; Anemia [ ]   Endocrine: Diabetes [ ] ; Thyroid  dysfunction[ ]   Home Medications Prior to Admission medications   Not on File    Past Medical History: Past Medical History  Diagnosis Date  . CAD (coronary artery disease)     3v  . Ischemic cardiomyopathy   . Chronic systolic heart failure   . HTN (hypertension)   . HLD (hyperlipidemia)     Past Surgical History: No past surgical history on file.  Family History: Family History  Problem Relation Age of Onset  . Heart failure Mother     deceased  . Cirrhosis Father     deceased    Social History: History   Social History  . Marital Status: Married    Spouse Name: N/A    Number of Children: N/A  . Years of Education: N/A   Social History Main Topics  . Smoking status: Former Smoker -- 1.00 packs/day for 50 years    Types: Cigarettes  . Smokeless tobacco: Former Systems developer    Quit date: 09/24/2014  . Alcohol Use: No  . Drug Use: No  . Sexual Activity: Not on file   Other Topics Concern  . Not on file   Social History Narrative  . No narrative on file    Allergies:  Allergies not on file  Objective:  Vital Signs:   Temp:  [97.8 F (36.6 C)] 97.8 F (36.6 C) (11/10 1211) Pulse Rate:  [55] 55 (11/10 1211) Resp:  [18] 18 (11/10 1211) BP: (108)/(62) 108/62 mmHg (11/10 1211) SpO2:  [98 %] 98 % (11/10 1211)   There were no vitals filed for this visit.  Physical Exam: General:  Well appearing. No resp difficulty, sitting up in chair HEENT: normal Neck: supple. JVP flat. Carotids 2+ bilat; + bilateral bruits. No lymphadenopathy or thryomegaly appreciated. Cor: PMI nondisplaced. Distant HS. Loletha Grayer. Regular.  2/6 SEM RSB Lungs: clear long exp phase Abdomen: soft, nontender, nondistended. No hepatosplenomegaly. No bruits or masses. Good bowel sounds. Extremities: no cyanosis, clubbing, rash, edema DPs nonpalpable bilaterally. No sores.  Neuro: alert & orientedx3, cranial nerves grossly intact. moves all 4 extremities w/o difficulty. Affect  pleasant  Telemetry: SB 50s  Labs: Basic Metabolic Panel: No results for input(s): NA, K, CL, CO2, GLUCOSE, BUN, CREATININE, CALCIUM, MG, PHOS in the last 168 hours.  Liver Function Tests: No results for input(s): AST, ALT, ALKPHOS, BILITOT, PROT, ALBUMIN in the last 168 hours. No results for input(s): LIPASE, AMYLASE in the last 168 hours. No results for input(s): AMMONIA in the last 168 hours.  CBC: No results for input(s): WBC, NEUTROABS, HGB, HCT, MCV, PLT in the last 168 hours.  Cardiac Enzymes: No results for input(s): CKTOTAL, CKMB, CKMBINDEX, TROPONINI in the last 168 hours.  BNP: BNP (last 3 results) No results for input(s): PROBNP in the last 8760 hours.  CBG: No results for input(s): GLUCAP in the last 168 hours.  Coagulation Studies: No results for input(s): LABPROT, INR in the last 72 hours.  Other results: EKG: pending  Imaging: No results found.      Assessment:   1) 3V CAD 2) Chronic systolic HF - EF 16-38% 3) Tobacco abuse - quit 4) HTN 5) PAD with intermittent claudication 6) Bilateral carotid bruits  Plan/Discussion:    Craig Page is a pleasant 68 yo male who we were asked to consult on for HF. He recently was admitted to [facility] with SOB and was found to have HF with an EF 25-30%. He was set up for stress testing on the OP side which showed ischemia and underwent a cardiac CT showing significant CAD. LHC showed 3V CAD and Dr. Darcey Nora was asked to assess for CABG.  Will get abdominal US, carotid dopplers and ABIs with history of tobacco abuse.  May need to set up [place] to assess hemodynamics.  Thanks for consult.    Length of Stay: 0 [name] NP-C 10/16/2014, 2:21 PM  Advanced Heart Failure Team Pager [phone] (M-F; 7a - 4p)  Please contact [facility] for night-coverage after hours (4p -7a ) and weekends on [email or web address]   Patient seen and examined with Junie Bame, NP. We discussed all aspects of the encounter. I agree with  the assessment and plan as stated above.   Craig Page has diffuse vascular disease. On cath has severe 3v CAD including 80% LM disease with EF 25-30%. He is currently stable from HF standpoint. Volume status looks good. Ideally would be best served by CABG. Will need;  -Echo -Ab u/s to exclude AAA -Pre-op carotids and ABIs -PFTs -Currently I don't think he will need RHC but that may change. Will d/w Dr. Prescott Gum.  -Continue HF meds -We will follow  Benay Spice 2:33 PM

## 2014-10-16 NOTE — Progress Notes (Signed)
  Echocardiogram 2D Echocardiogram has been performed.  Darlina Sicilian M 10/16/2014, 4:32 PM

## 2014-10-16 NOTE — Plan of Care (Signed)
Problem: Consults Goal: Diabetes Guidelines if Diabetic/Glucose > 140 If diabetic or lab glucose is > 140 mg/dl - Initiate Diabetes/Hyperglycemia Guidelines & Document Interventions  Outcome: Not Applicable Date Met:  10/16/14     

## 2014-10-17 ENCOUNTER — Encounter (HOSPITAL_COMMUNITY): Payer: Self-pay | Admitting: *Deleted

## 2014-10-17 ENCOUNTER — Inpatient Hospital Stay (HOSPITAL_COMMUNITY): Payer: Medicare HMO

## 2014-10-17 DIAGNOSIS — R001 Bradycardia, unspecified: Secondary | ICD-10-CM

## 2014-10-17 DIAGNOSIS — Z01810 Encounter for preprocedural cardiovascular examination: Secondary | ICD-10-CM

## 2014-10-17 LAB — PLATELET INHIBITION P2Y12: Platelet Function  P2Y12: 271 [PRU] (ref 194–418)

## 2014-10-17 LAB — PULMONARY FUNCTION TEST
DL/VA % pred: 48 %
DL/VA: 2.26 ml/min/mmHg/L
DLCO cor % pred: 39 %
DLCO cor: 13.19 ml/min/mmHg
DLCO unc % pred: 38 %
DLCO unc: 12.8 ml/min/mmHg
FEF 25-75 Post: 2.82 L/sec
FEF 25-75 Pre: 3.01 L/sec
FEF2575-%Change-Post: -6 %
FEF2575-%Pred-Post: 104 %
FEF2575-%Pred-Pre: 111 %
FEV1-%Change-Post: 0 %
FEV1-%Pred-Post: 98 %
FEV1-%Pred-Pre: 99 %
FEV1-Post: 3.44 L
FEV1-Pre: 3.47 L
FEV1FVC-%Change-Post: 2 %
FEV1FVC-%Pred-Pre: 101 %
FEV6-%Change-Post: -2 %
FEV6-%Pred-Post: 99 %
FEV6-%Pred-Pre: 102 %
FEV6-Post: 4.44 L
FEV6-Pre: 4.54 L
FEV6FVC-%Change-Post: 0 %
FEV6FVC-%Pred-Post: 105 %
FEV6FVC-%Pred-Pre: 104 %
FVC-%Change-Post: -3 %
FVC-%Pred-Post: 94 %
FVC-%Pred-Pre: 98 %
FVC-Post: 4.44 L
FVC-Pre: 4.6 L
Post FEV1/FVC ratio: 78 %
Post FEV6/FVC ratio: 100 %
Pre FEV1/FVC ratio: 75 %
Pre FEV6/FVC Ratio: 100 %
RV % pred: 107 %
RV: 2.65 L
TLC % pred: 97 %
TLC: 7.02 L

## 2014-10-17 LAB — BLOOD GAS, ARTERIAL
Acid-base deficit: 1 mmol/L (ref 0.0–2.0)
Bicarbonate: 22.4 mEq/L (ref 20.0–24.0)
Drawn by: 35135
FIO2: 0.21 %
O2 Saturation: 96.2 %
Patient temperature: 98.2
TCO2: 23.4 mmol/L (ref 0–100)
pCO2 arterial: 31.8 mmHg — ABNORMAL LOW (ref 35.0–45.0)
pH, Arterial: 7.461 — ABNORMAL HIGH (ref 7.350–7.450)
pO2, Arterial: 81.5 mmHg (ref 80.0–100.0)

## 2014-10-17 MED ORDER — CARVEDILOL 3.125 MG PO TABS
3.1250 mg | ORAL_TABLET | Freq: Two times a day (BID) | ORAL | Status: DC
  Administered 2014-10-18: 3.125 mg via ORAL
  Filled 2014-10-17 (×4): qty 1

## 2014-10-17 MED ORDER — ALBUTEROL SULFATE (2.5 MG/3ML) 0.083% IN NEBU
2.5000 mg | INHALATION_SOLUTION | Freq: Once | RESPIRATORY_TRACT | Status: AC
  Administered 2014-10-17: 2.5 mg via RESPIRATORY_TRACT

## 2014-10-17 MED ORDER — IOHEXOL 300 MG/ML  SOLN
75.0000 mL | Freq: Once | INTRAMUSCULAR | Status: AC | PRN
  Administered 2014-10-17: 75 mL via INTRAVENOUS

## 2014-10-17 NOTE — Progress Notes (Signed)
Advanced Heart Failure Rounding Note   Subjective:    No complaints. No SOB or CP.   Ab u/s with small AAA. Pre-CABG dopplers and PFTs pending    Objective:   Weight Range:  Vital Signs:   Temp:  [97.8 F (36.6 C)-98.2 F (36.8 C)] 97.9 F (36.6 C) (11/11 0405) Pulse Rate:  [47-56] 47 (11/11 0405) Resp:  [18-19] 19 (11/11 0405) BP: (107-113)/(46-62) 107/61 mmHg (11/11 0405) SpO2:  [97 %-100 %] 97 % (11/11 0405) Weight:  [78.971 kg (174 lb 1.6 oz)] 78.971 kg (174 lb 1.6 oz) (11/11 0405) Last BM Date: 10/15/14  Weight change: Filed Weights   10/17/14 0405  Weight: 78.971 kg (174 lb 1.6 oz)    Intake/Output:  No intake or output data in the 24 hours ending 10/17/14 0727   Physical Exam:   Physical Exam: General: Well appearing. No resp difficulty, sitting up in chair HEENT: normal Neck: supple. JVP flat. Carotids 2+ bilat; + bilateral bruits. No lymphadenopathy or thryomegaly appreciated. Cor: PMI nondisplaced. Distant HS. Loletha Grayer. Regular. 2/6 SEM RSB Lungs: clear long exp phase Abdomen: soft, nontender, nondistended. No hepatosplenomegaly. No bruits or masses. Good bowel sounds. Extremities: no cyanosis, clubbing, rash, edema DPs nonpalpable bilaterally. No sores.  Neuro: alert & orientedx3, cranial nerves grossly intact. moves all 4 extremities w/o difficulty. Affect pleasant  Telemetry: SB 40-50s  Labs: Basic Metabolic Panel:  Recent Labs Lab 10/16/14 1425  NA 140  K 4.4  CL 99  CO2 24  GLUCOSE 142*  BUN 22  CREATININE 1.19  CALCIUM 9.3  MG 2.2    Liver Function Tests:  Recent Labs Lab 10/16/14 1425  AST 13  ALT 11  ALKPHOS 100  BILITOT 0.5  PROT 7.0  ALBUMIN 3.6   No results for input(s): LIPASE, AMYLASE in the last 168 hours. No results for input(s): AMMONIA in the last 168 hours.  CBC:  Recent Labs Lab 10/16/14 1425  WBC 9.1  HGB 13.6  HCT 40.7  MCV 85.1  PLT 197    Cardiac Enzymes: No results for input(s): CKTOTAL,  CKMB, CKMBINDEX, TROPONINI in the last 168 hours.  BNP: BNP (last 3 results) No results for input(s): PROBNP in the last 8760 hours.   Other results:    Imaging: Ct Head W Wo Contrast  10/16/2014   CLINICAL DATA:  Near syncopal episode 10/15/2014. Personal history of lymphoma undergoing active treatment.  EXAM: CT HEAD WITHOUT AND WITH CONTRAST  TECHNIQUE: Contiguous axial images were obtained from the base of the skull through the vertex without and with intravenous contrast  CONTRAST:  25mL OMNIPAQUE IOHEXOL 300 MG/ML  SOLN  COMPARISON:  None.  FINDINGS: The brain shows generalized atrophy. There is an old right frontal cortical and subcortical infarction. There chronic small-vessel changes of the deep white matter. No sign of mass lesion, hemorrhage, hydrocephalus or extra-axial collection. After contrast administration, no abnormal enhancement occurs. Sinuses, middle ears and mastoids are clear. No calvarial abnormality.  IMPRESSION: No acute finding. No evidence of lymphoma involvement. Brain atrophy. Old right frontal infarction.   Electronically Signed   By: Nelson Chimes M.D.   On: 10/16/2014 18:57   US Aorta  10/16/2014   CLINICAL DATA:  Abdominal bruit.  EXAM: ULTRASOUND OF ABDOMINAL AORTA  TECHNIQUE: Ultrasound examination of the abdominal aorta was performed to evaluate for abdominal aortic aneurysm.  COMPARISON:  CT 02/12/2014 and 07/14/2010.  FINDINGS: Abdominal Aorta  Distal abdominal aortic aneurysm identified which is not significantly changed in  AP diameter and slightly larger in transverse diameter compared to the recent CT scan from 02/12/2014. Mild to moderate atherosclerotic plaque is present.  Maximum AP  Diameter:  3.7 cm  Maximum TRV  Diameter: 4.2 cm  Mild prominence of the common iliac arteries measuring 2 cm on the right and 1.8 cm on the left in transverse dimension.  IMPRESSION: Mild to moderate atherosclerosis of the abdominal aorta with evidence of patient's known  distal abdominal aortic aneurysm measuring 3.7 x 4.2 cm in AP and transverse dimensions. Recommend follow-up ultrasound 1 year. This recommendation follows ACR consensus guidelines: White Paper of the ACR Incidental Findings Committee II on Vascular Findings. J Am Coll Radiol [phone].   Electronically Signed   By: Marin Olp M.D.   On: 10/16/2014 19:44      Medications:     Scheduled Medications: . aspirin EC  81 mg Oral Daily  . atorvastatin  80 mg Oral q1800  . budesonide-formoterol  2 puff Inhalation BID  . carvedilol  6.25 mg Oral BID WC  . docusate sodium  100 mg Oral BID  . enoxaparin (LOVENOX) injection  40 mg Subcutaneous Q24H  . lisinopril  5 mg Oral Daily  . multivitamin with minerals  1 tablet Oral Daily  . sodium chloride  3 mL Intravenous Q12H  . sodium chloride  3 mL Intravenous Q12H  . spironolactone  25 mg Oral Daily     Infusions:     PRN Medications:  sodium chloride, acetaminophen **OR** acetaminophen, alum & mag hydroxide-simeth, bisacodyl, guaiFENesin-dextromethorphan, HYDROcodone-acetaminophen, sodium chloride, zolpidem   Assessment:   1) 3V CAD 2) Chronic systolic HF - EF 25-05% 3) Tobacco abuse - quit 4) HTN 5) PAD with intermittent claudication    -small AAA on u/s 6) Bilateral carotid bruits  Plan/Discussion:     Doing well. Await remainder of pre-CABG testing. Suspect he will be ok to proceed to CABG. Decrease carvedilol due to bradycardia.    Length of Stay: 1   Glori Bickers MD 10/17/2014, 7:27 AM  Advanced Heart Failure Team Pager [phone] (M-F; [place])  Please contact [facility] for night-coverage after hours (4p -7a ) and weekends on [email or web address]

## 2014-10-17 NOTE — Progress Notes (Signed)
VASCULAR LAB PRELIMINARY  PRELIMINARY  PRELIMINARY  PRELIMINARY  Bilateral lower extremity venous duplex completed.    Preliminary report:  Bilateral:  No evidence of DVT, superficial thrombosis, or Baker's Cyst.   Fawne Hughley, RVS 10/17/2014, 4:53 PM

## 2014-10-17 NOTE — Progress Notes (Addendum)
      [address]       [place] [address]             [phone]          Procedure(s) (LRB): CORONARY ARTERY BYPASS GRAFTING (CABG) (N/A) INTRAOPERATIVE TRANSESOPHAGEAL ECHOCARDIOGRAM (N/A)  Subjective: Patient without complaints this am.  Objective: Vital signs in last 24 hours: Temp:  [97.8 F (36.6 C)-98.2 F (36.8 C)] 97.9 F (36.6 C) (11/11 0405) Pulse Rate:  [47-56] 47 (11/11 0405) Cardiac Rhythm:  [-] Sinus bradycardia (11/10 2000) Resp:  [18-19] 19 (11/11 0405) BP: (107-113)/(46-62) 107/61 mmHg (11/11 0405) SpO2:  [97 %-100 %] 97 % (11/11 0405) Weight:  [174 lb 1.6 oz (78.971 kg)] 174 lb 1.6 oz (78.971 kg) (11/11 0405)  Current Weight  10/17/14 174 lb 1.6 oz (78.971 kg)         Physical Exam:  Cardiovascular: Bradycardia. Bruit left carotid Pulmonary: Clear to auscultation bilaterally; no rales, wheezes, or rhonchi. Abdomen: Soft, non tender, bowel sounds present. Extremities: No lower extremity edema. Compression stockings in place   Lab Results: CBC: Recent Labs  10/16/14 1425  WBC 9.1  HGB 13.6  HCT 40.7  PLT 197   BMET:  Recent Labs  10/16/14 1425  NA 140  K 4.4  CL 99  CO2 24  GLUCOSE 142*  BUN 22  CREATININE 1.19  CALCIUM 9.3    PT/INR:  Lab Results  Component Value Date   INR 1.09 10/16/2014   ABG:  INR: Will add last result for INR, ABG once components are confirmed Will add last 4 CBG results once components are confirmed  Assessment/Plan:  1. CV - Ischemic cardiomyopathy.SB in the 40's this am. 2 D echo showed LVEF 30%, akinesis of the inferior and inferior lateral walls., calcification of AV leaflets, no MR or stenosis, and no TR or PR. Will need eventual CABG. On Coreg 6.25 bid. Will decrease to 3.125 bid with parameters. If continues with bradycardia, will stop BB. 2.  Pulmonary - History of COPD.Await PFTs. CXR appears to show no pneumothorax. 3. Left carotid bruit-await doppler carotid US 4.  PVD-await venous duplex study   ZIMMERMAN,DONIELLE MPA-C 10/17/2014,7:33 AM  No DVT on [place] Severe reduction  in diffusion capacity on PFTs- 38% predicted- will get CT chest No R ICA identified on duplex- will get VVS consult for probable total occlusion CABG tentatively for fri patient examined and medical record reviewed,agree with above note. VAN TRIGT III,Donnelle Olmeda 10/17/2014

## 2014-10-17 NOTE — Plan of Care (Signed)
Problem: Consults Goal: Cardiac Surgery Patient Education ( See Patient Education module for education specifics.) Outcome: Progressing  Problem: Phase I - Pre-Op Goal: Point person for discharge identified Outcome: Completed/Met Date Met:  10/17/14 Goal: Pain controlled with appropriate interventions Outcome: Completed/Met Date Met:  10/17/14 Goal: Pre-Op Consults completed as appropriate Outcome: Progressing Goal: Pre-Op Education completed Outcome: Progressing

## 2014-10-17 NOTE — Progress Notes (Signed)
VASCULAR LAB PRELIMINARY  PRELIMINARY  PRELIMINARY  PRELIMINARY  Pre-op Cardiac Surgery  Carotid Findings:  Right - ICA appears to be non identifiable. There is what appears to be the ECA and a branch which both respond appropriately to temporal tap and flow pattern. Left carotid indicates a 40% to 50% ICA stenosis by velocity. Partial increase may be due to contralateral non indentifiable ICA. Vertebral artery flow is antegrade bilaterally.  Upper Extremity Right Left  Brachial Pressures 119 Triphasic 116 Triphasic  Radial Waveforms Triphasic Triphasic  Ulnar Waveforms Triphasic Triphasic  Palmar Arch (Allen's Test) Normal Abnormal   Findings:  Doppler waveforms remained normal on the right with both radial and ulnar compressions. Left Doppler waveforms obliterated with radial compression and remained normal with ulnar compression.    Lower  Extremity Right Left  Dorsalis Pedis 58 Dampened Monophasic 73 Monophasic  Posterior Tibial 65 Dampened Monophasic 67 Monophasic  Ankle/Brachial Indices 0.55 0.61    Findings:  Doppler waveforms are abnormal bilaterally at rest. Right ABI indicates a moderate to severe reduction in arterial flow at rest. Left ABI indicates a moderate reduction in arterial flow at rest.   Craig Page, RVS 10/17/2014, 4:54 PM

## 2014-10-18 DIAGNOSIS — I6521 Occlusion and stenosis of right carotid artery: Secondary | ICD-10-CM

## 2014-10-18 DIAGNOSIS — I6523 Occlusion and stenosis of bilateral carotid arteries: Secondary | ICD-10-CM

## 2014-10-18 LAB — PREPARE RBC (CROSSMATCH)

## 2014-10-18 LAB — ABO/RH: ABO/RH(D): O POS

## 2014-10-18 MED ORDER — INSULIN REGULAR HUMAN 100 UNIT/ML IJ SOLN
INTRAMUSCULAR | Status: AC
  Administered 2014-10-19: .9 [IU]/h via INTRAVENOUS
  Filled 2014-10-18: qty 2.5

## 2014-10-18 MED ORDER — CHLORHEXIDINE GLUCONATE 4 % EX LIQD
60.0000 mL | Freq: Once | CUTANEOUS | Status: AC
  Administered 2014-10-19: 4 via TOPICAL
  Filled 2014-10-18: qty 60

## 2014-10-18 MED ORDER — DOPAMINE-DEXTROSE 3.2-5 MG/ML-% IV SOLN
0.0000 ug/kg/min | INTRAVENOUS | Status: AC
  Administered 2014-10-19: 3 ug/kg/min via INTRAVENOUS
  Filled 2014-10-18: qty 250

## 2014-10-18 MED ORDER — TEMAZEPAM 15 MG PO CAPS: 15.0000 mg | ORAL_CAPSULE | Freq: Once | ORAL | Status: AC | PRN

## 2014-10-18 MED ORDER — CHLORHEXIDINE GLUCONATE 4 % EX LIQD
60.0000 mL | Freq: Once | CUTANEOUS | Status: AC
  Administered 2014-10-18: 4 via TOPICAL
  Filled 2014-10-18 (×2): qty 60

## 2014-10-18 MED ORDER — VANCOMYCIN HCL 10 G IV SOLR
1250.0000 mg | INTRAVENOUS | Status: AC
  Administered 2014-10-19: 1250 mg via INTRAVENOUS
  Filled 2014-10-18 (×2): qty 1250

## 2014-10-18 MED ORDER — DEXTROSE 5 % IV SOLN
30.0000 ug/min | INTRAVENOUS | Status: DC
  Administered 2014-10-19: 60 ug/min via INTRAVENOUS
  Filled 2014-10-18: qty 2

## 2014-10-18 MED ORDER — CEFUROXIME SODIUM 750 MG IJ SOLR
750.0000 mg | INTRAMUSCULAR | Status: DC
  Filled 2014-10-18: qty 750

## 2014-10-18 MED ORDER — AMINOCAPROIC ACID 250 MG/ML IV SOLN
INTRAVENOUS | Status: AC
  Administered 2014-10-19: 70 mL/h via INTRAVENOUS
  Filled 2014-10-18: qty 40

## 2014-10-18 MED ORDER — DEXTROSE 5 % IV SOLN
1.5000 g | INTRAVENOUS | Status: AC
  Administered 2014-10-19: 750 g via INTRAVENOUS
  Administered 2014-10-19: 1.5 g via INTRAVENOUS
  Filled 2014-10-18: qty 1.5

## 2014-10-18 MED ORDER — MAGNESIUM SULFATE 50 % IJ SOLN
40.0000 meq | INTRAMUSCULAR | Status: DC
  Filled 2014-10-18: qty 10

## 2014-10-18 MED ORDER — DEXMEDETOMIDINE HCL IN NACL 400 MCG/100ML IV SOLN
0.1000 ug/kg/h | INTRAVENOUS | Status: AC
  Administered 2014-10-19: 0.2 ug/kg/h via INTRAVENOUS
  Filled 2014-10-18: qty 100

## 2014-10-18 MED ORDER — POTASSIUM CHLORIDE 2 MEQ/ML IV SOLN
80.0000 meq | INTRAVENOUS | Status: DC
  Filled 2014-10-18: qty 40

## 2014-10-18 MED ORDER — SODIUM CHLORIDE 0.9 % IV SOLN
INTRAVENOUS | Status: DC
  Filled 2014-10-18: qty 30

## 2014-10-18 MED ORDER — DIAZEPAM 5 MG PO TABS
5.0000 mg | ORAL_TABLET | Freq: Once | ORAL | Status: AC
  Administered 2014-10-19: 5 mg via ORAL
  Filled 2014-10-18: qty 1

## 2014-10-18 MED ORDER — EPINEPHRINE HCL 1 MG/ML IJ SOLN
0.0000 ug/min | INTRAVENOUS | Status: DC
  Filled 2014-10-18: qty 4

## 2014-10-18 MED ORDER — BISACODYL 5 MG PO TBEC
5.0000 mg | DELAYED_RELEASE_TABLET | Freq: Once | ORAL | Status: DC
  Filled 2014-10-18: qty 1

## 2014-10-18 MED ORDER — NITROGLYCERIN IN D5W 200-5 MCG/ML-% IV SOLN
2.0000 ug/min | INTRAVENOUS | Status: AC
  Administered 2014-10-19: 5 ug/min via INTRAVENOUS
  Filled 2014-10-18: qty 250

## 2014-10-18 MED ORDER — METOPROLOL TARTRATE 12.5 MG HALF TABLET
12.5000 mg | ORAL_TABLET | Freq: Once | ORAL | Status: DC
  Filled 2014-10-18: qty 1

## 2014-10-18 MED ORDER — PLASMA-LYTE 148 IV SOLN
INTRAVENOUS | Status: AC
  Administered 2014-10-19: 500 mL
  Filled 2014-10-18: qty 2.5

## 2014-10-18 NOTE — Care Management Note (Signed)
    Page 1 of 1   10/24/2014     2:35:57 PM CARE MANAGEMENT NOTE 10/24/2014  Patient:  ZENITH, LAMPHIER   Account Number:  [id number]  Date Initiated:  10/18/2014  Documentation initiated by:  AMERSON,JULIE  Subjective/Objective Assessment:   Pt adm on 10/16/14 with CAD for CABG on 10/19/14.  PTA, pt independent, lives with spouse.     Action/Plan:   Will follow post op for dc needs as pt progresses.   Anticipated DC Date:  10/24/2014   Anticipated DC Plan:  [place]  CM consult      Choice offered to / List presented to:     DME arranged  [facility]      DME agency  [facility]        Status of service:  Completed, signed off Medicare Important Message given?  YES (If response is "NO", the following Medicare IM given date fields will be blank) Date Medicare IM given:  10/22/2014 Medicare IM given by:  Marvetta Gibbons Date Additional Medicare IM given:   Additional Medicare IM given by:    Discharge Disposition:  HOME/SELF CARE  Per UR Regulation:  Reviewed for med. necessity/level of care/duration of stay  If discussed at [place] of Stay Meetings, dates discussed:   10/23/2014    Comments:  10/24/14- 1330- Marvetta Gibbons RN, BSN [phone] Pt for d/c home today, rollator order in chart- as pt has [employer]- order faxed to [place]- pt would like DME delivered to home- [employer] aware of request-  [facility] to follow pt at discharge.

## 2014-10-18 NOTE — Progress Notes (Signed)
      [address]       [place] [address]             [phone]          Procedure(s) (LRB): CORONARY ARTERY BYPASS GRAFTING (CABG) (N/A) INTRAOPERATIVE TRANSESOPHAGEAL ECHOCARDIOGRAM (N/A)  Subjective: Patient eating breakfast this am. Has NOT had chest pain overnight.  Objective: Vital signs in last 24 hours: Temp:  [97.3 F (36.3 C)-98 F (36.7 C)] 97.6 F (36.4 C) (11/12 0501) Pulse Rate:  [49-56] 52 (11/12 0501) Cardiac Rhythm:  [-] Sinus bradycardia (11/12 0501) Resp:  [18] 18 (11/12 0501) BP: (103-113)/(53-58) 103/58 mmHg (11/12 0501) SpO2:  [98 %-100 %] 98 % (11/12 0501) Weight:  [177 lb 9.6 oz (80.559 kg)] 177 lb 9.6 oz (80.559 kg) (11/12 0503)  Current Weight  10/18/14 177 lb 9.6 oz (80.559 kg)      11/11 0701 - 11/12 0700 In: 840 [P.O.:840] Out: -   Physical Exam:  Cardiovascular: Bradycardia. Bruit left carotid Pulmonary: Clear to auscultation bilaterally; no rales, wheezes, or rhonchi. Abdomen: Soft, non tender, bowel sounds present. Extremities: No lower extremity edema. Compression stockings in place   Lab Results: CBC:  Recent Labs  10/16/14 1425  WBC 9.1  HGB 13.6  HCT 40.7  PLT 197   BMET:   Recent Labs  10/16/14 1425  NA 140  K 4.4  CL 99  CO2 24  GLUCOSE 142*  BUN 22  CREATININE 1.19  CALCIUM 9.3    PT/INR:  Lab Results  Component Value Date   INR 1.09 10/16/2014   ABG:  INR: Will add last result for INR, ABG once components are confirmed Will add last 4 CBG results once components are confirmed  Assessment/Plan:  1. CV - Ischemic cardiomyopathy.SB in the 40's this am.  Will need eventual CABG. On Coreg 3.125 bid. Has not been given secondary to bradycardia. Will stop. LIkely  CABG in am. 2.  Pulmonary - History of COPD. PFTs showed DLCO predicted 38%. CT chest showed mild emphysematous disease and atherosclerotic coronary artery disease. 3. Left carotid bruit-await doppler carotid US.  Right internal carotid likely totally occluded. Likely obtain VVS consult. 4. PVD- Venous duplex study showed no DVT bilaterally   Marchetta Navratil MPA-C 10/18/2014,7:34 AM

## 2014-10-18 NOTE — Progress Notes (Signed)
Pre-op education completed with pt and family. They have been reading [facility] book. Pt agrees to sternal precautions, mobility post op and IS. He is inspiring 2500 mL on IS.  [id number] Yves Dill CES, ACSM 12:15 PM 10/18/2014

## 2014-10-18 NOTE — Progress Notes (Signed)
Advanced Heart Failure Rounding Note   Subjective:    No complaints. No SOB or CP. Carvedilol cut back yesterday to 3.125 bid due to HR in 40s. Was held this am.   Echo: 30% RV normal RVSP 33  Carotids: R total occlusion L moderate disease ABIs: 0.5-0.6 range bilaterally PFTs: FEV1 3.5L, FVC 4.6L DLCO 39%  Chest CT: mild COPD ABG: 7.46/32/82/96% (RA)   Objective:   Weight Range:  Vital Signs:   Temp:  [97.3 F (36.3 C)-98 F (36.7 C)] 97.6 F (36.4 C) (11/12 0501) Pulse Rate:  [49-56] 52 (11/12 0501) Resp:  [18] 18 (11/12 0501) BP: (103-113)/(53-58) 103/58 mmHg (11/12 0501) SpO2:  [98 %-100 %] 98 % (11/12 0501) Weight:  [177 lb 9.6 oz (80.559 kg)] 177 lb 9.6 oz (80.559 kg) (11/12 0503) Last BM Date: 10/17/14  Weight change: Filed Weights   10/17/14 0405 10/18/14 0503  Weight: 174 lb 1.6 oz (78.971 kg) 177 lb 9.6 oz (80.559 kg)    Intake/Output:   Intake/Output Summary (Last 24 hours) at 10/18/14 1002 Last data filed at 10/17/14 1700  Gross per 24 hour  Intake    480 ml  Output      0 ml  Net    480 ml      Physical Exam: General: Well appearing. No resp difficulty, sitting up in chair HEENT: normal Neck: supple. JVP flat. Carotids 2+ bilat; + bilateral bruits. No lymphadenopathy or thryomegaly appreciated. Cor: PMI nondisplaced. Distant HS. Loletha Grayer. Regular. 2/6 SEM RSB Lungs: clear long exp phase Abdomen: soft, nontender, nondistended. No hepatosplenomegaly. No bruits or masses. Good bowel sounds. Extremities: no cyanosis, clubbing, rash, edema DPs nonpalpable bilaterally. No sores.  Neuro: alert & orientedx3, cranial nerves grossly intact. moves all 4 extremities w/o difficulty. Affect pleasant  Telemetry: SB 40-50s  Labs: Basic Metabolic Panel:  Recent Labs Lab 10/16/14 1425  NA 140  K 4.4  CL 99  CO2 24  GLUCOSE 142*  BUN 22  CREATININE 1.19  CALCIUM 9.3  MG 2.2    Liver Function Tests:  Recent Labs Lab 10/16/14 1425  AST 13  ALT  11  ALKPHOS 100  BILITOT 0.5  PROT 7.0  ALBUMIN 3.6   No results for input(s): LIPASE, AMYLASE in the last 168 hours. No results for input(s): AMMONIA in the last 168 hours.  CBC:  Recent Labs Lab 10/16/14 1425  WBC 9.1  HGB 13.6  HCT 40.7  MCV 85.1  PLT 197    Cardiac Enzymes: No results for input(s): CKTOTAL, CKMB, CKMBINDEX, TROPONINI in the last 168 hours.  BNP: BNP (last 3 results) No results for input(s): PROBNP in the last 8760 hours.   Other results:    Imaging: X-ray Chest Pa And Lateral  10/17/2014   CLINICAL DATA:  Preop for CAD  EXAM: CHEST  2 VIEW  COMPARISON:  10/15/2014  FINDINGS: Cardiomediastinal silhouette is stable. No acute infiltrate or pleural effusion. No pulmonary edema. Mild degenerative changes thoracic spine.  IMPRESSION: No active cardiopulmonary disease. Degenerative changes thoracic spine.   Electronically Signed   By: Lahoma Crocker M.D.   On: 10/17/2014 08:05   Ct Head W Wo Contrast  10/16/2014   CLINICAL DATA:  Near syncopal episode 10/15/2014. Personal history of lymphoma undergoing active treatment.  EXAM: CT HEAD WITHOUT AND WITH CONTRAST  TECHNIQUE: Contiguous axial images were obtained from the base of the skull through the vertex without and with intravenous contrast  CONTRAST:  59mL OMNIPAQUE IOHEXOL 300 MG/ML  SOLN  COMPARISON:  None.  FINDINGS: The brain shows generalized atrophy. There is an old right frontal cortical and subcortical infarction. There chronic small-vessel changes of the deep white matter. No sign of mass lesion, hemorrhage, hydrocephalus or extra-axial collection. After contrast administration, no abnormal enhancement occurs. Sinuses, middle ears and mastoids are clear. No calvarial abnormality.  IMPRESSION: No acute finding. No evidence of lymphoma involvement. Brain atrophy. Old right frontal infarction.   Electronically Signed   By: Nelson Chimes M.D.   On: 10/16/2014 18:57   Ct Chest W Contrast  10/17/2014    CLINICAL DATA:  Preop CABG.  History of lymphoma.  EXAM: CT CHEST WITH CONTRAST  TECHNIQUE: Multidetector CT imaging of the chest was performed during intravenous contrast administration.  CONTRAST:  67mL OMNIPAQUE IOHEXOL 300 MG/ML  SOLN  COMPARISON:  02/12/2014 PET-CT as well as chest CT 08/21/2009.  FINDINGS: Lungs are adequately inflated with mild emphysematous disease. Small calcified granuloma over the right middle lobe. Heart is normal in size. Left main and 3 vessel atherosclerotic coronary artery disease. There is moderate calcified plaque over the thoracic aorta. Pulmonary arterial system is within normal. There is no evidence of hilar, mediastinal or axillary adenopathy.  Images through the upper abdomen demonstrates a for calcified splenic granulomas. Moderate calcified plaque over the visualized abdominal aorta with mild mural thrombus. Mild to moderate spondylosis throughout the thoracic spine.  IMPRESSION: No acute cardiopulmonary disease.  Mild emphysematous disease.  Left main and 3 vessel atherosclerotic coronary artery disease.   Electronically Signed   By: Marin Olp M.D.   On: 10/17/2014 21:54   US Aorta  10/16/2014   CLINICAL DATA:  Abdominal bruit.  EXAM: ULTRASOUND OF ABDOMINAL AORTA  TECHNIQUE: Ultrasound examination of the abdominal aorta was performed to evaluate for abdominal aortic aneurysm.  COMPARISON:  CT 02/12/2014 and 07/14/2010.  FINDINGS: Abdominal Aorta  Distal abdominal aortic aneurysm identified which is not significantly changed in AP diameter and slightly larger in transverse diameter compared to the recent CT scan from 02/12/2014. Mild to moderate atherosclerotic plaque is present.  Maximum AP  Diameter:  3.7 cm  Maximum TRV  Diameter: 4.2 cm  Mild prominence of the common iliac arteries measuring 2 cm on the right and 1.8 cm on the left in transverse dimension.  IMPRESSION: Mild to moderate atherosclerosis of the abdominal aorta with evidence of patient's known  distal abdominal aortic aneurysm measuring 3.7 x 4.2 cm in AP and transverse dimensions. Recommend follow-up ultrasound 1 year. This recommendation follows ACR consensus guidelines: White Paper of the ACR Incidental Findings Committee II on Vascular Findings. J Am Coll Radiol [phone].   Electronically Signed   By: Marin Olp M.D.   On: 10/16/2014 19:44     Medications:     Scheduled Medications: . aspirin EC  81 mg Oral Daily  . atorvastatin  80 mg Oral q1800  . budesonide-formoterol  2 puff Inhalation BID  . docusate sodium  100 mg Oral BID  . enoxaparin (LOVENOX) injection  40 mg Subcutaneous Q24H  . lisinopril  5 mg Oral Daily  . multivitamin with minerals  1 tablet Oral Daily  . sodium chloride  3 mL Intravenous Q12H  . sodium chloride  3 mL Intravenous Q12H  . spironolactone  25 mg Oral Daily    Infusions:    PRN Medications: sodium chloride, acetaminophen **OR** acetaminophen, alum & mag hydroxide-simeth, bisacodyl, guaiFENesin-dextromethorphan, HYDROcodone-acetaminophen, sodium chloride, zolpidem   Assessment:   1) 3V CAD 2)  Chronic systolic HF - EF 02-72% 3) Tobacco abuse - quit 4) HTN 5) PAD with intermittent claudication    -small AAA on u/s 6) Carotid artery stenosis    - R 100%. L moderate 7) Mild COPD with severely reduced DLCO  Plan/Discussion:    Stable from cardiac perspective though he does seem to have some sinus node dysfunction with prominent bradycardia. Pre-CABG testing shows severe, diffuse vascular diease and I discussed this with him. VVS to see today.   Spirometry better than I expected but DLCO quite low suggestive of pulmonary edema or PAH pattern. However echo without signs of PAH. Chest CT not overly remarkable. I think it is acceptable from our standpoint to proceed with CABG but I would be happy to do RHC this afternoon if Dr. Prescott Gum feels it would be helpful in further evaluating operative risk.   Length of Stay:  2  Glori Bickers MD 10/18/2014, 10:02 AM  Advanced Heart Failure Team Pager [phone] (M-F; [place])  Please contact [facility] for night-coverage after hours (4p -7a ) and weekends on [email or web address]

## 2014-10-18 NOTE — Progress Notes (Signed)
Nutrition Brief Note  Patient identified on the Malnutrition Screening Tool (MST) Report.  Wt Readings from Last 15 Encounters:  10/18/14 177 lb 9.6 oz (80.559 kg)    Body mass index is 24.78 kg/(m^2). Patient meets criteria for Normal based on current BMI.   Current diet order is Heart Healthy, patient is consuming approximately 100% of meals at this time. Labs and medications reviewed.   No nutrition interventions warranted at this time. If nutrition issues arise, please consult RD.   Arthur Holms, RD, LDN Pager #: [phone] After-Hours Pager #: [phone]

## 2014-10-18 NOTE — Consult Note (Signed)
[facility] [place]  Reason for Consult:  Right ICA occlusion Referring Physician:  TCTS MRN #:  [id number]  History of Present Illness: This is a 68 y.o. male who we've consulted for regarding right ICA occlusion on pre-operative evaluation for CABG scheduled for tomorrow 10/19/14. Left ICA stenosis 40-50%. Three weeks ago he was admitted to [facility] for acute shortness of breath. His ECHO showed EF 25-30%. One week later he had an presyncopal episode where he felt weak and "didn't want to talk." He underwent a left heart catheterization that same day that revealed significant 3V CAD.   He denies any amaurosis fugax or temporary monocular blindness. He denies any hemiplegia, receptive or expressive aphasia. He denies any facial drooping. He reports having an MI back in 1992 (no records available) and possible TIA symptoms at that time. He is unsure of this but reports that any symptoms he experienced had quickly resolved.   On ROS, he currently denies any chest pain, shortness of breath and orthopnea. He denies any swelling of his lower extremities. He does not intermittent claudication of his right leg that resolves with rest. He denies any rest pain or non-healing wounds of the lower extremity. He describes some intermittent numbness of his feet "like they fall asleep" that quickly resolves. Denies any weakness of the extremities. All other systems negative.  He has a past medical history of ischemic cardiomyopathy, hypertension medically managed with an ACEI, chronic systolic HF, and hyperlipidemia managed on a statin. He takes a daily aspirin. He is not diabetic. He is a former smoker, quitting one month ago.   Past Medical History  Diagnosis Date  . CAD (coronary artery disease)     3v  . Ischemic cardiomyopathy   . Chronic systolic heart failure   . HTN (hypertension)   . HLD (hyperlipidemia)   . Follicular lymphoma   . Myocardial infarction 1992    treated  with thrombolytics/notes 10/16/2014   Past Surgical History  Procedure Laterality Date  . Hernia repair    . Umbilical hernia repair  1964    No Known Allergies  Prior to Admission medications   Medication Sig Start Date End Date Taking? Authorizing Provider  aspirin EC 81 MG tablet Take 81 mg by mouth daily.   Yes Historical Provider, MD  atorvastatin (LIPITOR) 40 MG tablet Take 40 mg by mouth daily. 09/23/14  Yes Historical Provider, MD  carvedilol (COREG) 12.5 MG tablet Take 12.5 mg by mouth 2 (two) times daily. 10/16/14  Yes Historical Provider, MD  ENTRESTO 24-26 MG TABS Take 1 tablet by mouth 2 (two) times daily.  10/03/14  Yes Historical Provider, MD  furosemide (LASIX) 40 MG tablet Take 20 mg by mouth daily. 09/23/14  Yes Historical Provider, MD  lisinopril (PRINIVIL,ZESTRIL) 10 MG tablet Take 10 mg by mouth daily. 09/23/14  Yes Historical Provider, MD  potassium chloride SA (K-DUR,KLOR-CON) 20 MEQ tablet Take 20 mEq by mouth daily. 09/23/14  Yes Historical Provider, MD  spironolactone (ALDACTONE) 25 MG tablet Take 25 mg by mouth daily. 09/23/14  Yes Historical Provider, MD    History   Social History  . Marital Status: Married    Spouse Name: N/A    Number of Children: N/A  . Years of Education: N/A   Occupational History  . Not on file.   Social History Main Topics  . Smoking status: Former Smoker -- 1.00 packs/day for 50 years    Types: Cigarettes    Quit date:  09/24/2014  . Smokeless tobacco: Never Used  . Alcohol Use: No  . Drug Use: No  . Sexual Activity: Not Currently   Other Topics Concern  . Not on file   Social History Narrative    Family History  Problem Relation Age of Onset  . Heart failure Mother     deceased  . Cirrhosis Father     deceased    Physical Examination  Filed Vitals:   10/18/14 0501  BP: 103/58  Pulse: 52  Temp: 97.6 F (36.4 C)  Resp: 18   Body mass index is 24.78 kg/(m^2).  General:  WDWN in NAD Gait: Not  observed HENT: WNL, normocephalic Eyes: Pupils equal Pulmonary: normal non-labored breathing, without rales, rhonchi,  wheezing Cardiac: regular, distant heart sounds, with soft RSB systolic murmur, no rubs or gallops; with left carotid bruit Abdomen: soft, NT/ND, no masses, no organomegaly, no AAA Skin: without rashes, without ulcers  Vascular Exam/Pulses:  Right Left  Radial 2+ (normal) 2+ (normal)  Femoral 2+ (normal) 2+ (normal)  Popliteal Unable to palpate Unable to palpate  DP Unable to palpate Unable to palpate  PT Unable to palpate Unable to palpate   Extremities: without ischemic changes, without Gangrene , without cellulitis; without open wounds;  Musculoskeletal: no muscle wasting or atrophy  Neurologic: A&O X 3; Appropriate Affect ; SENSATION: normal; MOTOR FUNCTION:  moving all extremities equally. Speech is fluent/normal Psychiatric:  Judgment intact, Mood & affect appropriate for pt's clinical situation Lymph: no palpable cervical LAD  CBC    Component Value Date/Time   WBC 9.1 10/16/2014 1425   RBC 4.78 10/16/2014 1425   HGB 13.6 10/16/2014 1425   HCT 40.7 10/16/2014 1425   PLT 197 10/16/2014 1425   MCV 85.1 10/16/2014 1425   MCH 28.5 10/16/2014 1425   MCHC 33.4 10/16/2014 1425   RDW 15.1 10/16/2014 1425    BMET    Component Value Date/Time   NA 140 10/16/2014 1425   K 4.4 10/16/2014 1425   CL 99 10/16/2014 1425   CO2 24 10/16/2014 1425   GLUCOSE 142* 10/16/2014 1425   BUN 22 10/16/2014 1425   CREATININE 1.19 10/16/2014 1425   CALCIUM 9.3 10/16/2014 1425   GFRNONAA 61* 10/16/2014 1425   GFRAA 71* 10/16/2014 1425    COAGS: Lab Results  Component Value Date   INR 1.09 10/16/2014     Non-Invasive Vascular Imaging:    Pre-op Cardiac Surgery (10/18/14)  Carotid Findings: Right - ICA appears to be non identifiable. There is what appears to be the ECA and a branch which both respond appropriately to temporal tap and flow pattern. Left carotid  indicates a 40% to 50% ICA stenosis by velocity. Partial increase may be due to contralateral non indentifiable ICA. Vertebral artery flow is antegrade bilaterally.  ASSESSMENT: This is a 68 y.o. male with asymptomatic right ICA occlusion and left ICA stenosis 40-50%.   Other active problems: CAD, systolic HF, ischemic cardiomyopathy, hypertension, hyperlipidemia.   PLAN: With total ICA occlusion, the patient is not a candidate for any carotid interventions. He has not exhibited any stroke or TIA symptoms related to his low grade left ICA stenosis. Will recommend routine surveillance with carotid duplex. No indications for concomitant carotid endarterectomy and CABG. Okay to proceed with surgery tomorrow.  Dr. Bridgett Larsson to evaluate patient.    Virgina Jock, PA-C [facility] Office: [phone] Pager: [phone]   Addendum  I have independently interviewed and examined the patient, and I  agree with the physician assistant's findings.  In general, carotid occlusion are not intervened upon due to risk of reperfusion bleeding after such except in the case of recent acute occlusions.  There is no way to time this occlusion, so I would not recommend any thrombectomy/R CEA.  The patient is asx at this point, so there is no indication for CABG/L CEA. The patient can follow up in the office with routine carotid surveillance in 6 months.    Adele Barthel, MD [facility] [place] Office: [phone] Pager: [phone]  10/18/2014, 9:14 PM

## 2014-10-19 ENCOUNTER — Inpatient Hospital Stay (HOSPITAL_COMMUNITY): Payer: Medicare HMO | Admitting: Anesthesiology

## 2014-10-19 ENCOUNTER — Inpatient Hospital Stay (HOSPITAL_COMMUNITY): Payer: Medicare HMO

## 2014-10-19 ENCOUNTER — Encounter (HOSPITAL_COMMUNITY)
Admission: AD | Disposition: A | Discharge status: Home / Self Care | Payer: Medicare HMO | Source: Other Acute Inpatient Hospital | Attending: Cardiothoracic Surgery

## 2014-10-19 DIAGNOSIS — I2510 Atherosclerotic heart disease of native coronary artery without angina pectoris: Secondary | ICD-10-CM | POA: Diagnosis present

## 2014-10-19 HISTORY — PX: INTRAOPERATIVE TRANSESOPHAGEAL ECHOCARDIOGRAM: SHX5062

## 2014-10-19 HISTORY — PX: CORONARY ARTERY BYPASS GRAFT: SHX141

## 2014-10-19 LAB — POCT I-STAT, CHEM 8
BUN: 12 mg/dL (ref 6–23)
BUN: 10 mg/dL (ref 6–23)
BUN: 11 mg/dL (ref 6–23)
BUN: 11 mg/dL (ref 6–23)
BUN: 10 mg/dL (ref 6–23)
BUN: 11 mg/dL (ref 6–23)
Calcium, Ion: 1.21 mmol/L (ref 1.13–1.30)
Calcium, Ion: 1 mmol/L — ABNORMAL LOW (ref 1.13–1.30)
Calcium, Ion: 1.17 mmol/L (ref 1.13–1.30)
Calcium, Ion: 1.02 mmol/L — ABNORMAL LOW (ref 1.13–1.30)
Calcium, Ion: 1.29 mmol/L (ref 1.13–1.30)
Calcium, Ion: 1.33 mmol/L — ABNORMAL HIGH (ref 1.13–1.30)
Chloride: 106 mEq/L (ref 96–112)
Chloride: 102 mEq/L (ref 96–112)
Chloride: 107 mEq/L (ref 96–112)
Chloride: 101 mEq/L (ref 96–112)
Chloride: 101 mEq/L (ref 96–112)
Chloride: 109 mEq/L (ref 96–112)
Creatinine, Ser: 0.8 mg/dL (ref 0.50–1.35)
Creatinine, Ser: 0.7 mg/dL (ref 0.50–1.35)
Creatinine, Ser: 0.8 mg/dL (ref 0.50–1.35)
Creatinine, Ser: 0.8 mg/dL (ref 0.50–1.35)
Creatinine, Ser: 0.8 mg/dL (ref 0.50–1.35)
Creatinine, Ser: 1 mg/dL (ref 0.50–1.35)
Glucose, Bld: 91 mg/dL (ref 70–99)
Glucose, Bld: 105 mg/dL — ABNORMAL HIGH (ref 70–99)
Glucose, Bld: 94 mg/dL (ref 70–99)
Glucose, Bld: 140 mg/dL — ABNORMAL HIGH (ref 70–99)
Glucose, Bld: 178 mg/dL — ABNORMAL HIGH (ref 70–99)
Glucose, Bld: 123 mg/dL — ABNORMAL HIGH (ref 70–99)
HCT: 32 % — ABNORMAL LOW (ref 39.0–52.0)
HCT: 24 % — ABNORMAL LOW (ref 39.0–52.0)
HCT: 31 % — ABNORMAL LOW (ref 39.0–52.0)
HCT: 23 % — ABNORMAL LOW (ref 39.0–52.0)
HCT: 26 % — ABNORMAL LOW (ref 39.0–52.0)
HCT: 22 % — ABNORMAL LOW (ref 39.0–52.0)
Hemoglobin: 10.9 g/dL — ABNORMAL LOW (ref 13.0–17.0)
Hemoglobin: 10.5 g/dL — ABNORMAL LOW (ref 13.0–17.0)
Hemoglobin: 8.2 g/dL — ABNORMAL LOW (ref 13.0–17.0)
Hemoglobin: 7.8 g/dL — ABNORMAL LOW (ref 13.0–17.0)
Hemoglobin: 8.8 g/dL — ABNORMAL LOW (ref 13.0–17.0)
Hemoglobin: 7.5 g/dL — ABNORMAL LOW (ref 13.0–17.0)
Potassium: 3.9 mEq/L (ref 3.7–5.3)
Potassium: 4 mEq/L (ref 3.7–5.3)
Potassium: 4.1 mEq/L (ref 3.7–5.3)
Potassium: 4.5 mEq/L (ref 3.7–5.3)
Potassium: 4 mEq/L (ref 3.7–5.3)
Potassium: 4.6 mEq/L (ref 3.7–5.3)
Sodium: 139 mEq/L (ref 137–147)
Sodium: 137 mEq/L (ref 137–147)
Sodium: 137 mEq/L (ref 137–147)
Sodium: 130 mEq/L — ABNORMAL LOW (ref 137–147)
Sodium: 135 mEq/L — ABNORMAL LOW (ref 137–147)
Sodium: 136 mEq/L — ABNORMAL LOW (ref 137–147)
TCO2: 24 mmol/L (ref 0–100)
TCO2: 22 mmol/L (ref 0–100)
TCO2: 23 mmol/L (ref 0–100)
TCO2: 21 mmol/L (ref 0–100)
TCO2: 18 mmol/L (ref 0–100)
TCO2: 21 mmol/L (ref 0–100)

## 2014-10-19 LAB — CBC
HCT: 35.1 % — ABNORMAL LOW (ref 39.0–52.0)
HCT: 28 % — ABNORMAL LOW (ref 39.0–52.0)
HCT: 24.3 % — ABNORMAL LOW (ref 39.0–52.0)
Hemoglobin: 11.6 g/dL — ABNORMAL LOW (ref 13.0–17.0)
Hemoglobin: 9.6 g/dL — ABNORMAL LOW (ref 13.0–17.0)
Hemoglobin: 8.4 g/dL — ABNORMAL LOW (ref 13.0–17.0)
MCH: 27.9 pg (ref 26.0–34.0)
MCH: 28.6 pg (ref 26.0–34.0)
MCH: 28.6 pg (ref 26.0–34.0)
MCHC: 33 g/dL (ref 30.0–36.0)
MCHC: 34.3 g/dL (ref 30.0–36.0)
MCHC: 34.6 g/dL (ref 30.0–36.0)
MCV: 84.4 fL (ref 78.0–100.0)
MCV: 83.3 fL (ref 78.0–100.0)
MCV: 82.7 fL (ref 78.0–100.0)
PLATELETS: 143 10*3/uL — AB (ref 150–400)
Platelets: 181 10*3/uL (ref 150–400)
Platelets: 107 10*3/uL — ABNORMAL LOW (ref 150–400)
RBC: 4.16 MIL/uL — ABNORMAL LOW (ref 4.22–5.81)
RBC: 3.36 MIL/uL — ABNORMAL LOW (ref 4.22–5.81)
RBC: 2.94 MIL/uL — ABNORMAL LOW (ref 4.22–5.81)
RDW: 15.2 % (ref 11.5–15.5)
RDW: 15 % (ref 11.5–15.5)
RDW: 15 % (ref 11.5–15.5)
WBC: 9 10*3/uL (ref 4.0–10.5)
WBC: 19.5 10*3/uL — AB (ref 4.0–10.5)
WBC: 13.9 10*3/uL — ABNORMAL HIGH (ref 4.0–10.5)

## 2014-10-19 LAB — POCT I-STAT 3, ART BLOOD GAS (G3+)
Acid-Base Excess: 1 mmol/L (ref 0.0–2.0)
Acid-base deficit: 6 mmol/L — ABNORMAL HIGH (ref 0.0–2.0)
Acid-base deficit: 5 mmol/L — ABNORMAL HIGH (ref 0.0–2.0)
Acid-base deficit: 2 mmol/L (ref 0.0–2.0)
Acid-base deficit: 3 mmol/L — ABNORMAL HIGH (ref 0.0–2.0)
Bicarbonate: 25.2 mEq/L — ABNORMAL HIGH (ref 20.0–24.0)
Bicarbonate: 19.8 mEq/L — ABNORMAL LOW (ref 20.0–24.0)
Bicarbonate: 21.6 mEq/L (ref 20.0–24.0)
Bicarbonate: 23.1 mEq/L (ref 20.0–24.0)
Bicarbonate: 22.2 mEq/L (ref 20.0–24.0)
O2 Saturation: 100 %
O2 Saturation: 96 %
O2 Saturation: 94 %
O2 Saturation: 99 %
O2 Saturation: 96 %
Patient temperature: 35.8
Patient temperature: 36.1
Patient temperature: 36.3
TCO2: 26 mmol/L (ref 0–100)
TCO2: 21 mmol/L (ref 0–100)
TCO2: 23 mmol/L (ref 0–100)
TCO2: 24 mmol/L (ref 0–100)
TCO2: 23 mmol/L (ref 0–100)
pCO2 arterial: 39.6 mmHg (ref 35.0–45.0)
pCO2 arterial: 38 mmHg (ref 35.0–45.0)
pCO2 arterial: 43 mmHg (ref 35.0–45.0)
pCO2 arterial: 38.2 mmHg (ref 35.0–45.0)
pCO2 arterial: 36.7 mmHg (ref 35.0–45.0)
pH, Arterial: 7.412 (ref 7.350–7.450)
pH, Arterial: 7.324 — ABNORMAL LOW (ref 7.350–7.450)
pH, Arterial: 7.304 — ABNORMAL LOW (ref 7.350–7.450)
pH, Arterial: 7.387 (ref 7.350–7.450)
pH, Arterial: 7.387 (ref 7.350–7.450)
pO2, Arterial: 396 mmHg — ABNORMAL HIGH (ref 80.0–100.0)
pO2, Arterial: 87 mmHg (ref 80.0–100.0)
pO2, Arterial: 72 mmHg — ABNORMAL LOW (ref 80.0–100.0)
pO2, Arterial: 113 mmHg — ABNORMAL HIGH (ref 80.0–100.0)
pO2, Arterial: 80 mmHg (ref 80.0–100.0)

## 2014-10-19 LAB — URINALYSIS, ROUTINE W REFLEX MICROSCOPIC
Bilirubin Urine: NEGATIVE
Glucose, UA: NEGATIVE mg/dL
Hgb urine dipstick: NEGATIVE
Ketones, ur: NEGATIVE mg/dL
Leukocytes, UA: NEGATIVE
Nitrite: NEGATIVE
Protein, ur: NEGATIVE mg/dL
Specific Gravity, Urine: 1.011 (ref 1.005–1.030)
Urobilinogen, UA: 0.2 mg/dL (ref 0.0–1.0)
pH: 6.5 (ref 5.0–8.0)

## 2014-10-19 LAB — GLUCOSE, CAPILLARY
Glucose-Capillary: 121 mg/dL — ABNORMAL HIGH (ref 70–99)
Glucose-Capillary: 99 mg/dL (ref 70–99)
Glucose-Capillary: 95 mg/dL (ref 70–99)
Glucose-Capillary: 114 mg/dL — ABNORMAL HIGH (ref 70–99)
Glucose-Capillary: 123 mg/dL — ABNORMAL HIGH (ref 70–99)
Glucose-Capillary: 134 mg/dL — ABNORMAL HIGH (ref 70–99)
Glucose-Capillary: 170 mg/dL — ABNORMAL HIGH (ref 70–99)
Glucose-Capillary: 140 mg/dL — ABNORMAL HIGH (ref 70–99)

## 2014-10-19 LAB — PROTIME-INR
INR: 1.52 — ABNORMAL HIGH (ref 0.00–1.49)
PROTHROMBIN TIME: 18.4 s — AB (ref 11.6–15.2)

## 2014-10-19 LAB — POCT I-STAT 4, (NA,K, GLUC, HGB,HCT)
Glucose, Bld: 146 mg/dL — ABNORMAL HIGH (ref 70–99)
HCT: 29 % — ABNORMAL LOW (ref 39.0–52.0)
Hemoglobin: 9.9 g/dL — ABNORMAL LOW (ref 13.0–17.0)
Potassium: 3.7 mEq/L (ref 3.7–5.3)
Sodium: 137 mEq/L (ref 137–147)

## 2014-10-19 LAB — HEMOGLOBIN A1C
Hgb A1c MFr Bld: 5.7 % — ABNORMAL HIGH (ref <5.7)
Mean Plasma Glucose: 117 mg/dL — ABNORMAL HIGH (ref <117)

## 2014-10-19 LAB — HEMOGLOBIN AND HEMATOCRIT, BLOOD
HCT: 25.3 % — ABNORMAL LOW (ref 39.0–52.0)
Hemoglobin: 8.7 g/dL — ABNORMAL LOW (ref 13.0–17.0)

## 2014-10-19 LAB — MAGNESIUM: Magnesium: 3.1 mg/dL — ABNORMAL HIGH (ref 1.5–2.5)

## 2014-10-19 LAB — BASIC METABOLIC PANEL
Anion gap: 14 (ref 5–15)
BUN: 16 mg/dL (ref 6–23)
CO2: 23 mEq/L (ref 19–32)
Calcium: 8.7 mg/dL (ref 8.4–10.5)
Chloride: 106 mEq/L (ref 96–112)
Creatinine, Ser: 1.05 mg/dL (ref 0.50–1.35)
GFR calc Af Amer: 83 mL/min — ABNORMAL LOW (ref >90)
GFR calc non Af Amer: 71 mL/min — ABNORMAL LOW (ref >90)
Glucose, Bld: 85 mg/dL (ref 70–99)
Potassium: 4.6 mEq/L (ref 3.7–5.3)
Sodium: 143 mEq/L (ref 137–147)

## 2014-10-19 LAB — CREATININE, SERUM
Creatinine, Ser: 0.99 mg/dL (ref 0.50–1.35)
GFR calc Af Amer: >90 mL/min (ref >90)
GFR calc non Af Amer: 83 mL/min — ABNORMAL LOW (ref >90)

## 2014-10-19 LAB — PLATELET COUNT: Platelets: 115 10*3/uL — ABNORMAL LOW (ref 150–400)

## 2014-10-19 LAB — APTT: aPTT: 41 seconds — ABNORMAL HIGH (ref 24–37)

## 2014-10-19 SURGERY — CORONARY ARTERY BYPASS GRAFTING (CABG)
Anesthesia: General | Site: Groin

## 2014-10-19 MED ORDER — INSULIN REGULAR BOLUS VIA INFUSION
0.0000 [IU] | Freq: Three times a day (TID) | INTRAVENOUS | Status: DC
  Filled 2014-10-19: qty 10

## 2014-10-19 MED ORDER — DOCUSATE SODIUM 100 MG PO CAPS
200.0000 mg | ORAL_CAPSULE | Freq: Every day | ORAL | Status: DC
  Administered 2014-10-20 – 2014-10-23 (×4): 200 mg via ORAL
  Filled 2014-10-19 (×5): qty 2

## 2014-10-19 MED ORDER — DEXMEDETOMIDINE HCL IN NACL 200 MCG/50ML IV SOLN
0.0000 ug/kg/h | INTRAVENOUS | Status: DC
  Administered 2014-10-19: 0.6 ug/kg/h via INTRAVENOUS
  Filled 2014-10-19: qty 50

## 2014-10-19 MED ORDER — FENTANYL CITRATE 0.05 MG/ML IJ SOLN
INTRAMUSCULAR | Status: AC
  Filled 2014-10-19: qty 5

## 2014-10-19 MED ORDER — AMIODARONE HCL IN DEXTROSE 360-4.14 MG/200ML-% IV SOLN
60.0000 mg/h | INTRAVENOUS | Status: DC
  Administered 2014-10-19: 33.33 mL/h via INTRAVENOUS
  Filled 2014-10-19: qty 200

## 2014-10-19 MED ORDER — METOPROLOL TARTRATE 25 MG/10 ML ORAL SUSPENSION
12.5000 mg | Freq: Two times a day (BID) | ORAL | Status: DC
  Filled 2014-10-19 (×3): qty 5

## 2014-10-19 MED ORDER — ACETAMINOPHEN 160 MG/5ML PO SOLN
1000.0000 mg | Freq: Four times a day (QID) | ORAL | Status: DC
Start: 2014-10-20 — End: 2014-10-20

## 2014-10-19 MED ORDER — SODIUM CHLORIDE 0.9 % IJ SOLN: 3.0000 mL | INTRAMUSCULAR | Status: DC | PRN

## 2014-10-19 MED ORDER — POTASSIUM CHLORIDE 10 MEQ/50ML IV SOLN
10.0000 meq | Freq: Once | INTRAVENOUS | Status: AC
  Administered 2014-10-19: 10 meq via INTRAVENOUS

## 2014-10-19 MED ORDER — MIDAZOLAM HCL 5 MG/5ML IJ SOLN
INTRAMUSCULAR | Status: DC | PRN
  Administered 2014-10-19 (×3): 2 mg via INTRAVENOUS
  Administered 2014-10-19: 4 mg via INTRAVENOUS
  Administered 2014-10-19: 2 mg via INTRAVENOUS

## 2014-10-19 MED ORDER — CALCIUM CHLORIDE 10 % IV SOLN
1.0000 g | Freq: Once | INTRAVENOUS | Status: AC
  Administered 2014-10-19: 1 g via INTRAVENOUS
  Filled 2014-10-19: qty 10

## 2014-10-19 MED ORDER — MIDAZOLAM HCL 2 MG/2ML IJ SOLN: 2.0000 mg | INTRAMUSCULAR | Status: DC | PRN

## 2014-10-19 MED ORDER — VANCOMYCIN HCL IN DEXTROSE 1-5 GM/200ML-% IV SOLN
1000.0000 mg | Freq: Once | INTRAVENOUS | Status: AC
  Administered 2014-10-19: 1000 mg via INTRAVENOUS
  Filled 2014-10-19: qty 200

## 2014-10-19 MED ORDER — PROPOFOL 10 MG/ML IV BOLUS
INTRAVENOUS | Status: AC
  Filled 2014-10-19: qty 20

## 2014-10-19 MED ORDER — METOCLOPRAMIDE HCL 5 MG/ML IJ SOLN
10.0000 mg | Freq: Four times a day (QID) | INTRAMUSCULAR | Status: DC | PRN
  Filled 2014-10-19: qty 2

## 2014-10-19 MED ORDER — VECURONIUM BROMIDE 10 MG IV SOLR
INTRAVENOUS | Status: AC
  Filled 2014-10-19: qty 10

## 2014-10-19 MED ORDER — ACETAMINOPHEN 500 MG PO TABS
1000.0000 mg | ORAL_TABLET | Freq: Four times a day (QID) | ORAL | Status: DC
  Administered 2014-10-20 – 2014-10-23 (×15): 1000 mg via ORAL
  Filled 2014-10-19 (×21): qty 2

## 2014-10-19 MED ORDER — ALBUMIN HUMAN 5 % IV SOLN
INTRAVENOUS | Status: DC | PRN
  Administered 2014-10-19 (×2): via INTRAVENOUS

## 2014-10-19 MED ORDER — ROCURONIUM BROMIDE 50 MG/5ML IV SOLN
INTRAVENOUS | Status: AC
  Filled 2014-10-19: qty 1

## 2014-10-19 MED ORDER — NITROGLYCERIN IN D5W 200-5 MCG/ML-% IV SOLN
0.0000 ug/min | INTRAVENOUS | Status: DC
Start: 2014-10-19 — End: 2014-10-20
  Administered 2014-10-19: 1 ug/min via INTRAVENOUS

## 2014-10-19 MED ORDER — TRAMADOL HCL 50 MG PO TABS
50.0000 mg | ORAL_TABLET | ORAL | Status: DC | PRN
  Administered 2014-10-20: 100 mg via ORAL
  Administered 2014-10-21 (×2): 50 mg via ORAL
  Filled 2014-10-19: qty 2
  Filled 2014-10-19 (×2): qty 1

## 2014-10-19 MED ORDER — SUCCINYLCHOLINE CHLORIDE 20 MG/ML IJ SOLN
INTRAMUSCULAR | Status: AC
  Filled 2014-10-19: qty 1

## 2014-10-19 MED ORDER — BISACODYL 5 MG PO TBEC
10.0000 mg | DELAYED_RELEASE_TABLET | Freq: Every day | ORAL | Status: DC
  Administered 2014-10-20 – 2014-10-23 (×4): 10 mg via ORAL
  Filled 2014-10-19 (×5): qty 2

## 2014-10-19 MED ORDER — PROTAMINE SULFATE 10 MG/ML IV SOLN
INTRAVENOUS | Status: DC | PRN
  Administered 2014-10-19 (×2): 20 mg via INTRAVENOUS
  Administered 2014-10-19: 50 mg via INTRAVENOUS
  Administered 2014-10-19: 90 mg via INTRAVENOUS
  Administered 2014-10-19: 20 mg via INTRAVENOUS
  Administered 2014-10-19: 25 mg via INTRAVENOUS

## 2014-10-19 MED ORDER — MILRINONE IN DEXTROSE 20 MG/100ML IV SOLN
0.1250 ug/kg/min | INTRAVENOUS | Status: AC
  Administered 2014-10-19: .3 ug/kg/min via INTRAVENOUS
  Filled 2014-10-19: qty 100

## 2014-10-19 MED ORDER — ROCURONIUM BROMIDE 50 MG/5ML IV SOLN
INTRAVENOUS | Status: AC
  Filled 2014-10-19: qty 2

## 2014-10-19 MED ORDER — SODIUM CHLORIDE 0.9 % IV SOLN
INTRAVENOUS | Status: DC
  Administered 2014-10-19: 13:00:00 via INTRAVENOUS

## 2014-10-19 MED ORDER — MILRINONE IN DEXTROSE 20 MG/100ML IV SOLN
0.3000 ug/kg/min | INTRAVENOUS | Status: DC
  Administered 2014-10-19 – 2014-10-20 (×3): 0.3 ug/kg/min via INTRAVENOUS
  Filled 2014-10-19 (×2): qty 100

## 2014-10-19 MED ORDER — SODIUM CHLORIDE 0.9 % IV SOLN
20.0000 ug | Freq: Once | INTRAVENOUS | Status: DC
  Filled 2014-10-19: qty 5

## 2014-10-19 MED ORDER — PROPOFOL 10 MG/ML IV BOLUS
INTRAVENOUS | Status: DC | PRN
  Administered 2014-10-19: 100 mg via INTRAVENOUS

## 2014-10-19 MED ORDER — POTASSIUM CHLORIDE 10 MEQ/50ML IV SOLN
10.0000 meq | INTRAVENOUS | Status: AC
  Administered 2014-10-19 (×3): 10 meq via INTRAVENOUS

## 2014-10-19 MED ORDER — FENTANYL CITRATE 0.05 MG/ML IJ SOLN
INTRAMUSCULAR | Status: AC
Start: 2014-10-19 — End: 2014-10-19
  Filled 2014-10-19: qty 5

## 2014-10-19 MED ORDER — EPHEDRINE SULFATE 50 MG/ML IJ SOLN
INTRAMUSCULAR | Status: AC
Start: 2014-10-19 — End: 2014-10-19
  Filled 2014-10-19: qty 1

## 2014-10-19 MED ORDER — MIDAZOLAM HCL 2 MG/2ML IJ SOLN
INTRAMUSCULAR | Status: AC
  Filled 2014-10-19: qty 2

## 2014-10-19 MED ORDER — AMIODARONE HCL IN DEXTROSE 360-4.14 MG/200ML-% IV SOLN
INTRAVENOUS | Status: DC | PRN
  Administered 2014-10-19: 30 mg/h via INTRAVENOUS

## 2014-10-19 MED ORDER — EPHEDRINE SULFATE 50 MG/ML IJ SOLN
INTRAMUSCULAR | Status: AC
  Filled 2014-10-19: qty 1

## 2014-10-19 MED ORDER — OXYCODONE HCL 5 MG PO TABS
5.0000 mg | ORAL_TABLET | ORAL | Status: DC | PRN
  Administered 2014-10-20: 10 mg via ORAL
  Administered 2014-10-20: 5 mg via ORAL
  Administered 2014-10-21: 10 mg via ORAL
  Administered 2014-10-22: 5 mg via ORAL
  Filled 2014-10-19 (×2): qty 2
  Filled 2014-10-19: qty 1
  Filled 2014-10-19: qty 2

## 2014-10-19 MED ORDER — ARTIFICIAL TEARS OP OINT
TOPICAL_OINTMENT | OPHTHALMIC | Status: DC | PRN
  Administered 2014-10-19: 1 via OPHTHALMIC

## 2014-10-19 MED ORDER — MORPHINE SULFATE 2 MG/ML IJ SOLN: 1.0000 mg | INTRAMUSCULAR | Status: DC | PRN

## 2014-10-19 MED ORDER — ONDANSETRON HCL 4 MG/2ML IJ SOLN
INTRAMUSCULAR | Status: AC
  Filled 2014-10-19: qty 2

## 2014-10-19 MED ORDER — SODIUM CHLORIDE 0.9 % IJ SOLN
INTRAMUSCULAR | Status: AC
  Filled 2014-10-19: qty 10

## 2014-10-19 MED ORDER — LACTATED RINGERS IV SOLN
INTRAVENOUS | Status: DC | PRN
  Administered 2014-10-19: 07:00:00 via INTRAVENOUS

## 2014-10-19 MED ORDER — ACETAMINOPHEN 160 MG/5ML PO SOLN: 650.0000 mg | Freq: Once | ORAL | Status: AC

## 2014-10-19 MED ORDER — LEVALBUTEROL HCL 1.25 MG/0.5ML IN NEBU
1.2500 mg | INHALATION_SOLUTION | Freq: Four times a day (QID) | RESPIRATORY_TRACT | Status: DC
Start: 2014-10-19 — End: 2014-10-24
  Administered 2014-10-19 – 2014-10-24 (×16): 1.25 mg via RESPIRATORY_TRACT
  Filled 2014-10-19 (×24): qty 0.5

## 2014-10-19 MED ORDER — SODIUM CHLORIDE 0.9 % IV SOLN
20.0000 ug | INTRAVENOUS | Status: DC | PRN
  Administered 2014-10-19: 20 ug via INTRAVENOUS

## 2014-10-19 MED ORDER — ACETAMINOPHEN 650 MG RE SUPP
650.0000 mg | Freq: Once | RECTAL | Status: AC
  Administered 2014-10-19: 650 mg via RECTAL

## 2014-10-19 MED ORDER — AMIODARONE HCL IN DEXTROSE 360-4.14 MG/200ML-% IV SOLN
30.0000 mg/h | INTRAVENOUS | Status: DC
Start: 2014-10-19 — End: 2014-10-21
  Administered 2014-10-19 – 2014-10-21 (×6): 30 mg/h via INTRAVENOUS
  Filled 2014-10-19 (×8): qty 200

## 2014-10-19 MED ORDER — DOPAMINE-DEXTROSE 3.2-5 MG/ML-% IV SOLN
0.0000 ug/kg/min | INTRAVENOUS | Status: DC
  Administered 2014-10-19: 5 ug/kg/min via INTRAVENOUS

## 2014-10-19 MED ORDER — LIDOCAINE HCL (CARDIAC) 20 MG/ML IV SOLN
INTRAVENOUS | Status: AC
  Filled 2014-10-19: qty 5

## 2014-10-19 MED ORDER — CHLORHEXIDINE GLUCONATE 0.12 % MT SOLN
15.0000 mL | Freq: Two times a day (BID) | OROMUCOSAL | Status: DC
  Administered 2014-10-19 – 2014-10-21 (×3): 15 mL via OROMUCOSAL
  Filled 2014-10-19 (×3): qty 15

## 2014-10-19 MED ORDER — CETYLPYRIDINIUM CHLORIDE 0.05 % MT LIQD
7.0000 mL | Freq: Four times a day (QID) | OROMUCOSAL | Status: DC
  Administered 2014-10-19 – 2014-10-21 (×6): 7 mL via OROMUCOSAL

## 2014-10-19 MED ORDER — ASPIRIN EC 325 MG PO TBEC
325.0000 mg | DELAYED_RELEASE_TABLET | Freq: Every day | ORAL | Status: DC
  Administered 2014-10-20 – 2014-10-21 (×2): 325 mg via ORAL
  Filled 2014-10-19 (×2): qty 1

## 2014-10-19 MED ORDER — HEMOSTATIC AGENTS (NO CHARGE) OPTIME
TOPICAL | Status: DC | PRN
  Administered 2014-10-19: 1 via TOPICAL

## 2014-10-19 MED ORDER — LACTATED RINGERS IV SOLN
INTRAVENOUS | Status: DC
  Administered 2014-10-19: 20 mL/h via INTRAVENOUS

## 2014-10-19 MED ORDER — FAMOTIDINE IN NACL 20-0.9 MG/50ML-% IV SOLN
20.0000 mg | Freq: Two times a day (BID) | INTRAVENOUS | Status: DC
Start: 2014-10-19 — End: 2014-10-19
  Administered 2014-10-19: 20 mg via INTRAVENOUS

## 2014-10-19 MED ORDER — LACTATED RINGERS IV SOLN: 500.0000 mL | Freq: Once | INTRAVENOUS | Status: AC | PRN

## 2014-10-19 MED ORDER — ONDANSETRON HCL 4 MG/2ML IJ SOLN: 4.0000 mg | Freq: Four times a day (QID) | INTRAMUSCULAR | Status: DC | PRN

## 2014-10-19 MED ORDER — MORPHINE SULFATE 2 MG/ML IJ SOLN: 2.0000 mg | INTRAMUSCULAR | Status: DC | PRN

## 2014-10-19 MED ORDER — ARTIFICIAL TEARS OP OINT
TOPICAL_OINTMENT | OPHTHALMIC | Status: AC
  Filled 2014-10-19: qty 3.5

## 2014-10-19 MED ORDER — BUDESONIDE-FORMOTEROL FUMARATE 160-4.5 MCG/ACT IN AERO
2.0000 | INHALATION_SPRAY | Freq: Two times a day (BID) | RESPIRATORY_TRACT | Status: DC
  Administered 2014-10-19 – 2014-10-23 (×9): 2 via RESPIRATORY_TRACT
  Filled 2014-10-19: qty 6

## 2014-10-19 MED ORDER — SODIUM CHLORIDE 0.9 % IJ SOLN
INTRAMUSCULAR | Status: AC
  Filled 2014-10-19: qty 20

## 2014-10-19 MED ORDER — PROTAMINE SULFATE 10 MG/ML IV SOLN
INTRAVENOUS | Status: AC
  Filled 2014-10-19: qty 25

## 2014-10-19 MED ORDER — ASPIRIN 81 MG PO CHEW: 324.0000 mg | CHEWABLE_TABLET | Freq: Every day | ORAL | Status: DC

## 2014-10-19 MED ORDER — PROTAMINE SULFATE 10 MG/ML IV SOLN
INTRAVENOUS | Status: AC
  Filled 2014-10-19: qty 5

## 2014-10-19 MED ORDER — PANTOPRAZOLE SODIUM 40 MG PO TBEC
40.0000 mg | DELAYED_RELEASE_TABLET | Freq: Every day | ORAL | Status: DC
  Administered 2014-10-21 – 2014-10-24 (×4): 40 mg via ORAL
  Filled 2014-10-19 (×4): qty 1

## 2014-10-19 MED ORDER — METOPROLOL TARTRATE 12.5 MG HALF TABLET
12.5000 mg | ORAL_TABLET | Freq: Two times a day (BID) | ORAL | Status: DC
  Administered 2014-10-20: 12.5 mg via ORAL
  Filled 2014-10-19 (×5): qty 1

## 2014-10-19 MED ORDER — SODIUM CHLORIDE 0.9 % IV SOLN
INTRAVENOUS | Status: DC
  Administered 2014-10-19 (×2): via INTRAVENOUS
  Filled 2014-10-19 (×2): qty 2.5

## 2014-10-19 MED ORDER — ROCURONIUM BROMIDE 100 MG/10ML IV SOLN
INTRAVENOUS | Status: DC | PRN
  Administered 2014-10-19: 100 mg via INTRAVENOUS

## 2014-10-19 MED ORDER — HEPARIN SODIUM (PORCINE) 1000 UNIT/ML IJ SOLN
INTRAMUSCULAR | Status: DC | PRN
  Administered 2014-10-19: 18000 [IU] via INTRAVENOUS
  Administered 2014-10-19: 2000 [IU] via INTRAVENOUS
  Administered 2014-10-19: 4000 [IU] via INTRAVENOUS

## 2014-10-19 MED ORDER — PHENYLEPHRINE HCL 10 MG/ML IJ SOLN
0.0000 ug/min | INTRAVENOUS | Status: DC
  Administered 2014-10-19: 60 ug/min via INTRAVENOUS
  Administered 2014-10-19: 30 ug/min via INTRAVENOUS
  Filled 2014-10-19 (×2): qty 2

## 2014-10-19 MED ORDER — SODIUM BICARBONATE 8.4 % IV SOLN
50.0000 meq | Freq: Once | INTRAVENOUS | Status: AC
  Administered 2014-10-19: 50 meq via INTRAVENOUS
  Filled 2014-10-19: qty 50

## 2014-10-19 MED ORDER — MAGNESIUM SULFATE 4 GM/100ML IV SOLN
4.0000 g | Freq: Once | INTRAVENOUS | Status: AC
  Administered 2014-10-19: 4 g via INTRAVENOUS
  Filled 2014-10-19: qty 100

## 2014-10-19 MED ORDER — METOPROLOL TARTRATE 1 MG/ML IV SOLN
2.5000 mg | INTRAVENOUS | Status: DC | PRN
  Filled 2014-10-19: qty 5

## 2014-10-19 MED ORDER — BISACODYL 10 MG RE SUPP
10.0000 mg | Freq: Every day | RECTAL | Status: DC
Start: 2014-10-20 — End: 2014-10-24

## 2014-10-19 MED ORDER — SODIUM CHLORIDE 0.9 % IJ SOLN
3.0000 mL | Freq: Two times a day (BID) | INTRAMUSCULAR | Status: DC
  Administered 2014-10-20 – 2014-10-24 (×5): 3 mL via INTRAVENOUS

## 2014-10-19 MED ORDER — VECURONIUM BROMIDE 10 MG IV SOLR
INTRAVENOUS | Status: DC | PRN
  Administered 2014-10-19 (×3): 5 mg via INTRAVENOUS

## 2014-10-19 MED ORDER — MIDAZOLAM HCL 10 MG/2ML IJ SOLN
INTRAMUSCULAR | Status: AC
  Filled 2014-10-19: qty 2

## 2014-10-19 MED ORDER — SODIUM CHLORIDE 0.9 % IJ SOLN
OROMUCOSAL | Status: DC | PRN
  Administered 2014-10-19 (×2): via TOPICAL

## 2014-10-19 MED ORDER — LEVALBUTEROL HCL 0.63 MG/3ML IN NEBU: 0.6300 mg | INHALATION_SOLUTION | Freq: Three times a day (TID) | RESPIRATORY_TRACT | Status: DC

## 2014-10-19 MED ORDER — ALBUMIN HUMAN 5 % IV SOLN
12.5000 g | Freq: Once | INTRAVENOUS | Status: AC
  Administered 2014-10-19: 12.5 g via INTRAVENOUS

## 2014-10-19 MED ORDER — SODIUM CHLORIDE 0.9 % IV SOLN: 250.0000 mL | INTRAVENOUS | Status: DC

## 2014-10-19 MED ORDER — FENTANYL CITRATE 0.05 MG/ML IJ SOLN
INTRAMUSCULAR | Status: DC | PRN
  Administered 2014-10-19: 250 ug via INTRAVENOUS
  Administered 2014-10-19: 150 ug via INTRAVENOUS
  Administered 2014-10-19: 50 ug via INTRAVENOUS
  Administered 2014-10-19: 200 ug via INTRAVENOUS
  Administered 2014-10-19 (×2): 100 ug via INTRAVENOUS
  Administered 2014-10-19 (×2): 250 ug via INTRAVENOUS

## 2014-10-19 MED ORDER — STERILE WATER FOR INJECTION IJ SOLN
INTRAMUSCULAR | Status: AC
  Filled 2014-10-19: qty 10

## 2014-10-19 MED ORDER — 0.9 % SODIUM CHLORIDE (POUR BTL) OPTIME
TOPICAL | Status: DC | PRN
  Administered 2014-10-19 (×2): 1000 mL

## 2014-10-19 MED ORDER — STERILE WATER FOR INJECTION IJ SOLN
INTRAMUSCULAR | Status: AC
Start: 2014-10-19 — End: 2014-10-19
  Filled 2014-10-19: qty 10

## 2014-10-19 MED ORDER — ALBUMIN HUMAN 5 % IV SOLN
250.0000 mL | INTRAVENOUS | Status: AC | PRN
  Administered 2014-10-19: 250 mL via INTRAVENOUS
  Filled 2014-10-19: qty 250

## 2014-10-19 MED ORDER — DEXTROSE 5 % IV SOLN
1.5000 g | Freq: Two times a day (BID) | INTRAVENOUS | Status: AC
  Administered 2014-10-19 – 2014-10-21 (×4): 1.5 g via INTRAVENOUS
  Filled 2014-10-19 (×4): qty 1.5

## 2014-10-19 MED ORDER — SODIUM CHLORIDE 0.45 % IV SOLN
INTRAVENOUS | Status: DC
  Administered 2014-10-19: 13:00:00 via INTRAVENOUS

## 2014-10-19 MED ORDER — SODIUM CHLORIDE 0.9 % IV SOLN
10.0000 mg | INTRAVENOUS | Status: DC | PRN
  Administered 2014-10-19: 20 ug/min via INTRAVENOUS

## 2014-10-19 SURGICAL SUPPLY — 110 items
ADAPTER CARDIO PERF ANTE/RETRO (ADAPTER) ×5 IMPLANT
ATTRACTOMAT 16X20 MAGNETIC DRP (DRAPES) ×5 IMPLANT
BAG DECANTER FOR FLEXI CONT (MISCELLANEOUS) ×5 IMPLANT
BANDAGE ELASTIC 4 VELCRO ST LF (GAUZE/BANDAGES/DRESSINGS) ×5 IMPLANT
BANDAGE ELASTIC 6 VELCRO ST LF (GAUZE/BANDAGES/DRESSINGS) ×5 IMPLANT
BASKET HEART  (ORDER IN 25'S) (MISCELLANEOUS) ×1
BASKET HEART (ORDER IN 25'S) (MISCELLANEOUS) ×1
BASKET HEART (ORDER IN 25S) (MISCELLANEOUS) ×3 IMPLANT
BLADE STERNUM SYSTEM 6 (BLADE) ×5 IMPLANT
BLADE SURG 11 STRL SS (BLADE) ×5 IMPLANT
BLADE SURG 12 STRL SS (BLADE) ×5 IMPLANT
BLADE SURG ROTATE 9660 (MISCELLANEOUS) IMPLANT
BNDG GAUZE ELAST 4 BULKY (GAUZE/BANDAGES/DRESSINGS) ×5 IMPLANT
CANISTER SUCTION 2500CC (MISCELLANEOUS) ×5 IMPLANT
CANNULA GUNDRY RCSP 15FR (MISCELLANEOUS) ×5 IMPLANT
CATH CPB KIT VANTRIGT (MISCELLANEOUS) ×5 IMPLANT
CATH ROBINSON RED A/P 18FR (CATHETERS) ×15 IMPLANT
CATH THORACIC 36FR RT ANG (CATHETERS) ×5 IMPLANT
CLIP TI WIDE RED SMALL 24 (CLIP) ×10 IMPLANT
COUNTER NEEDLE 20 DBL MAG RED (NEEDLE) ×5 IMPLANT
COVER SURGICAL LIGHT HANDLE (MISCELLANEOUS) ×5 IMPLANT
CRADLE DONUT ADULT HEAD (MISCELLANEOUS) ×5 IMPLANT
DERMABOND ADVANCED (GAUZE/BANDAGES/DRESSINGS) ×2
DERMABOND ADVANCED .7 DNX12 (GAUZE/BANDAGES/DRESSINGS) ×3 IMPLANT
DRAIN CHANNEL 32F RND 10.7 FF (WOUND CARE) ×5 IMPLANT
DRAPE CARDIOVASCULAR INCISE (DRAPES) ×2
DRAPE SLUSH/WARMER DISC (DRAPES) ×5 IMPLANT
DRAPE SRG 135X102X78XABS (DRAPES) ×3 IMPLANT
DRSG AQUACEL AG ADV 3.5X14 (GAUZE/BANDAGES/DRESSINGS) ×5 IMPLANT
ELECT BLADE 4.0 EZ CLEAN MEGAD (MISCELLANEOUS) ×5
ELECT BLADE 6.5 EXT (BLADE) ×5 IMPLANT
ELECT CAUTERY BLADE 6.4 (BLADE) ×5 IMPLANT
ELECT REM PT RETURN 9FT ADLT (ELECTROSURGICAL) ×10
ELECTRODE BLDE 4.0 EZ CLN MEGD (MISCELLANEOUS) ×3 IMPLANT
ELECTRODE REM PT RTRN 9FT ADLT (ELECTROSURGICAL) ×6 IMPLANT
GAUZE SPONGE 4X4 12PLY STRL (GAUZE/BANDAGES/DRESSINGS) ×10 IMPLANT
GLOVE BIO SURGEON STRL SZ7.5 (GLOVE) ×15 IMPLANT
GLOVE BIOGEL M 6.5 STRL (GLOVE) ×20 IMPLANT
GLOVE BIOGEL M STER SZ 6 (GLOVE) ×15 IMPLANT
GLOVE BIOGEL PI IND STRL 6 (GLOVE) ×3 IMPLANT
GLOVE BIOGEL PI IND STRL 6.5 (GLOVE) ×6 IMPLANT
GLOVE BIOGEL PI IND STRL 7.0 (GLOVE) ×12 IMPLANT
GLOVE BIOGEL PI INDICATOR 6 (GLOVE) ×2
GLOVE BIOGEL PI INDICATOR 6.5 (GLOVE) ×4
GLOVE BIOGEL PI INDICATOR 7.0 (GLOVE) ×8
GOWN STRL REUS W/ TWL LRG LVL3 (GOWN DISPOSABLE) ×24 IMPLANT
GOWN STRL REUS W/TWL LRG LVL3 (GOWN DISPOSABLE) ×16
HEMOSTAT POWDER SURGIFOAM 1G (HEMOSTASIS) ×15 IMPLANT
HEMOSTAT SURGICEL 2X14 (HEMOSTASIS) ×5 IMPLANT
INSERT FOGARTY XLG (MISCELLANEOUS) IMPLANT
KIT BASIN OR (CUSTOM PROCEDURE TRAY) ×5 IMPLANT
KIT ROOM TURNOVER OR (KITS) ×5 IMPLANT
KIT SUCTION CATH 14FR (SUCTIONS) ×5 IMPLANT
KIT VASOVIEW W/TROCAR VH 2000 (KITS) ×5 IMPLANT
LEAD PACING MYOCARDI (MISCELLANEOUS) ×5 IMPLANT
MARKER GRAFT CORONARY BYPASS (MISCELLANEOUS) ×15 IMPLANT
MARKER SKIN DUAL TIP RULER LAB (MISCELLANEOUS) ×5 IMPLANT
NS IRRIG 1000ML POUR BTL (IV SOLUTION) ×30 IMPLANT
PACK OPEN HEART (CUSTOM PROCEDURE TRAY) ×5 IMPLANT
PAD ARMBOARD 7.5X6 YLW CONV (MISCELLANEOUS) ×10 IMPLANT
PAD ELECT DEFIB RADIOL ZOLL (MISCELLANEOUS) ×5 IMPLANT
PENCIL BUTTON HOLSTER BLD 10FT (ELECTRODE) ×5 IMPLANT
PUNCH AORTIC ROTATE  4.5MM 8IN (MISCELLANEOUS) ×5 IMPLANT
PUNCH AORTIC ROTATE 4.0MM (MISCELLANEOUS) IMPLANT
PUNCH AORTIC ROTATE 4.5MM 8IN (MISCELLANEOUS) IMPLANT
PUNCH AORTIC ROTATE 5MM 8IN (MISCELLANEOUS) IMPLANT
SET CARDIOPLEGIA MPS 5001102 (MISCELLANEOUS) ×5 IMPLANT
SPONGE GAUZE 4X4 12PLY STER LF (GAUZE/BANDAGES/DRESSINGS) ×10 IMPLANT
SPONGE LAP 18X18 X RAY DECT (DISPOSABLE) ×10 IMPLANT
SPONGE LAP 4X18 X RAY DECT (DISPOSABLE) ×5 IMPLANT
STOPCOCK 4 WAY LG BORE MALE ST (IV SETS) ×5 IMPLANT
SURGIFLO W/THROMBIN 8M KIT (HEMOSTASIS) ×5 IMPLANT
SUT BONE WAX W31G (SUTURE) ×5 IMPLANT
SUT MNCRL AB 4-0 PS2 18 (SUTURE) ×5 IMPLANT
SUT PROLENE 3 0 SH DA (SUTURE) IMPLANT
SUT PROLENE 3 0 SH1 36 (SUTURE) IMPLANT
SUT PROLENE 4 0 RB 1 (SUTURE) ×2
SUT PROLENE 4 0 SH DA (SUTURE) ×10 IMPLANT
SUT PROLENE 4-0 RB1 .5 CRCL 36 (SUTURE) ×3 IMPLANT
SUT PROLENE 5 0 C 1 36 (SUTURE) IMPLANT
SUT PROLENE 6 0 C 1 30 (SUTURE) ×20 IMPLANT
SUT PROLENE 6 0 CC (SUTURE) ×15 IMPLANT
SUT PROLENE 8 0 BV175 6 (SUTURE) ×10 IMPLANT
SUT PROLENE BLUE 7 0 (SUTURE) ×10 IMPLANT
SUT SILK  1 MH (SUTURE)
SUT SILK 1 MH (SUTURE) IMPLANT
SUT SILK 2 0 SH CR/8 (SUTURE) IMPLANT
SUT SILK 2 0SH CR/8 30 (SUTURE) ×5 IMPLANT
SUT SILK 3 0 SH CR/8 (SUTURE) IMPLANT
SUT STEEL 6MS V (SUTURE) ×10 IMPLANT
SUT STEEL STERNAL CCS#1 18IN (SUTURE) ×5 IMPLANT
SUT STEEL SZ 6 DBL 3X14 BALL (SUTURE) ×5 IMPLANT
SUT VIC AB 1 CTX 36 (SUTURE) ×4
SUT VIC AB 1 CTX36XBRD ANBCTR (SUTURE) ×6 IMPLANT
SUT VIC AB 2-0 CT1 27 (SUTURE) ×2
SUT VIC AB 2-0 CT1 TAPERPNT 27 (SUTURE) ×3 IMPLANT
SUT VIC AB 2-0 CTX 27 (SUTURE) IMPLANT
SUT VIC AB 3-0 SH 27 (SUTURE) ×2
SUT VIC AB 3-0 SH 27X BRD (SUTURE) ×3 IMPLANT
SUT VIC AB 3-0 X1 27 (SUTURE) IMPLANT
SUTURE E-PAK OPEN HEART (SUTURE) ×5 IMPLANT
SYSTEM SAHARA CHEST DRAIN ATS (WOUND CARE) ×5 IMPLANT
TAPE CLOTH SURG 4X10 WHT LF (GAUZE/BANDAGES/DRESSINGS) ×10 IMPLANT
TOWEL OR 17X24 6PK STRL BLUE (TOWEL DISPOSABLE) ×10 IMPLANT
TOWEL OR 17X26 10 PK STRL BLUE (TOWEL DISPOSABLE) ×10 IMPLANT
TRAY FOLEY IC TEMP SENS 16FR (CATHETERS) ×5 IMPLANT
TUBING EXTENTION W/L.L. (IV SETS) ×10 IMPLANT
TUBING INSUFFLATION (TUBING) ×5 IMPLANT
UNDERPAD 30X30 INCONTINENT (UNDERPADS AND DIAPERS) ×5 IMPLANT
WATER STERILE IRR 1000ML POUR (IV SOLUTION) ×10 IMPLANT

## 2014-10-19 NOTE — Progress Notes (Signed)
[facility] BRIEF SICU PROGRESS NOTE  Day of Surgery  S/P Procedure(s) (LRB): CORONARY ARTERY BYPASS GRAFTING (CABG) (N/A) INTRAOPERATIVE TRANSESOPHAGEAL ECHOCARDIOGRAM (N/A) ARTERIAL LINE INSERTION (Left)   Extubated uneventfully AAI paced w/ stable hemodynamics on milrinone, neo and dopamine Chest tube output low UOP adequate Labs okay w/ Hgb 8.4  Plan: Continue routine early postop  Craig Page H 10/19/2014 8:25 PM

## 2014-10-19 NOTE — Procedures (Signed)
Extubation Procedure Note  Patient Details:   Name: Craig Page DOB: 01-31-1946 MRN: [id number]   Airway Documentation:     Evaluation  O2 sats: stable throughout Complications: No apparent complications Patient did tolerate procedure well. Bilateral Breath Sounds: Clear Suctioning: Oral, Airway Yes   Pt VC 0.70, NIF -30. Pt extubated to 2L [place]. No complications. Pt stable throughout. Pt able to speak name post extubation. RT will continue to monitor.   Jesse Sans 10/19/2014, 5:28 PM

## 2014-10-19 NOTE — Plan of Care (Signed)
Problem: Phase I Progression Outcomes Goal: Pain controlled with appropriate interventions Outcome: Not Applicable Date Met:  90/12/22 Goal: OOB as tolerated unless otherwise ordered Outcome: Completed/Met Date Met:  10/19/14

## 2014-10-19 NOTE — Brief Op Note (Signed)
10/16/2014 - 10/19/2014  11:26 AM  PATIENT:  Craig Page  68 y.o. male  PRE-OPERATIVE DIAGNOSIS:  1. 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)   PROCEDURE:  INTRAOPERATIVE TRANSESOPHAGEAL ECHOCARDIOGRAM, CORONARY ARTERY BYPASS GRAFTING (CABG)3 (LIMA to LAD, SVG to DIAGONAL, SVG to Om) with EVH from LEFT THIGH and LOWER LEG, LEFT FEMORAL ARTERIAL LINE INSERTION  SURGEON:  Surgeon(s) and Role:    * Ivin Poot, MD - Primary  PHYSICIAN ASSISTANT: Lars Pinks PA-C  ANESTHESIA:   general  EBL:  Total I/O In: -  Out: 250 [Urine:250]  DRAINS: Chest tubes placed in the mediastinal and pleural spaces   COUNTS CORRECT:  YES  DICTATION: .Dragon Dictation  PLAN OF CARE: Admit to inpatient   PATIENT DISPOSITION:  ICU - intubated and hemodynamically stable.   Delay start of Pharmacological VTE agent (>24hrs) due to surgical blood loss or risk of bleeding: yes  BASELINE WEIGHT: 79 kg

## 2014-10-19 NOTE — Progress Notes (Signed)
*  PRELIMINARY RESULTS* Echocardiogram Echocardiogram Transesophageal has been performed.  WILLIAMSON, WHITTED 10/19/2014, 12:23 PM

## 2014-10-19 NOTE — Anesthesia Preprocedure Evaluation (Addendum)
Anesthesia Evaluation  Patient identified by MRN, date of birth, ID band Patient awake    Reviewed: Allergy & Precautions, H&P , NPO status , Patient's Chart, lab work & pertinent test results  Airway Mallampati: II   Neck ROM: full    Dental  (+) Dental Advidsory Given, Edentulous Upper, Edentulous Lower   Pulmonary former smoker,          Cardiovascular hypertension, + CAD and + Past MI  EF 30%   Neuro/Psych    GI/Hepatic   Endo/Other    Renal/GU      Musculoskeletal   Abdominal   Peds  Hematology   Anesthesia Other Findings   Reproductive/Obstetrics                            Anesthesia Physical Anesthesia Plan  ASA: III  Anesthesia Plan: General   Post-op Pain Management:    Induction: Intravenous  Airway Management Planned: Oral ETT  Additional Equipment: Arterial line, CVP, PA Cath, TEE and Ultrasound Guidance Line Placement  Intra-op Plan:   Post-operative Plan:   Informed Consent: I have reviewed the patients History and Physical, chart, labs and discussed the procedure including the risks, benefits and alternatives for the proposed anesthesia with the patient or authorized representative who has indicated his/her understanding and acceptance.   Dental Advisory Given  Plan Discussed with: CRNA, Anesthesiologist and Surgeon  Anesthesia Plan Comments:        Anesthesia Quick Evaluation

## 2014-10-19 NOTE — OR Nursing (Signed)
SICU Notification 1st call [phone]

## 2014-10-19 NOTE — Transfer of Care (Signed)
Immediate Anesthesia Transfer of Care Note  Patient: Nasim Habeeb  Procedure(s) Performed: Procedure(s) with comments: CORONARY ARTERY BYPASS GRAFTING (CABG) (N/A) - Times 3 using left internal mammary artery and endoscopically harvested left saphenous vein INTRAOPERATIVE TRANSESOPHAGEAL ECHOCARDIOGRAM (N/A) ARTERIAL LINE INSERTION (Left) - Left femoral line   Patient Location: SICU  Anesthesia Type:General  Level of Consciousness: Patient remains intubated per anesthesia plan  Airway & Oxygen Therapy: Patient remains intubated per anesthesia plan and Patient placed on Ventilator (see vital sign flow sheet for setting)  Post-op Assessment: Report given to PACU RN and Post -op Vital signs reviewed and stable  Post vital signs: Reviewed and stable  Complications: No apparent anesthesia complications

## 2014-10-19 NOTE — Anesthesia Procedure Notes (Signed)
Procedure Name: Intubation Date/Time: 10/19/2014 7:41 AM Performed by: Neldon Newport Pre-anesthesia Checklist: Patient being monitored, Suction available, Emergency Drugs available, Patient identified and Timeout performed Patient Re-evaluated:Patient Re-evaluated prior to inductionOxygen Delivery Method: Circle system utilized Preoxygenation: Pre-oxygenation with 100% oxygen Intubation Type: IV induction Ventilation: Mask ventilation without difficulty Laryngoscope Size: Mac and 3 Grade View: Grade I Tube type: Oral Tube size: 8.0 mm Number of attempts: 1 Placement Confirmation: positive ETCO2,  ETT inserted through vocal cords under direct vision and breath sounds checked- equal and bilateral Secured at: 23 cm Tube secured with: Tape Dental Injury: Teeth and Oropharynx as per pre-operative assessment

## 2014-10-19 NOTE — Progress Notes (Signed)
The patient was examined and preop studies reviewed. There has been no change from the prior exam and the patient is ready for surgery.   Plan CABG on B [place] today

## 2014-10-20 ENCOUNTER — Inpatient Hospital Stay (HOSPITAL_COMMUNITY): Payer: Medicare HMO

## 2014-10-20 DIAGNOSIS — Z951 Presence of aortocoronary bypass graft: Secondary | ICD-10-CM

## 2014-10-20 LAB — CREATININE, SERUM
CREATININE: 0.92 mg/dL (ref 0.50–1.35)
GFR calc Af Amer: >90 mL/min (ref >90)
GFR calc non Af Amer: 85 mL/min — ABNORMAL LOW (ref >90)

## 2014-10-20 LAB — BASIC METABOLIC PANEL
Anion gap: 13 (ref 5–15)
BUN: 13 mg/dL (ref 6–23)
CHLORIDE: 106 meq/L (ref 96–112)
CO2: 22 meq/L (ref 19–32)
Calcium: 8.6 mg/dL (ref 8.4–10.5)
Creatinine, Ser: 1 mg/dL (ref 0.50–1.35)
GFR calc Af Amer: 88 mL/min — ABNORMAL LOW (ref >90)
GFR calc non Af Amer: 76 mL/min — ABNORMAL LOW (ref >90)
Glucose, Bld: 119 mg/dL — ABNORMAL HIGH (ref 70–99)
Potassium: 4.6 mEq/L (ref 3.7–5.3)
Sodium: 141 mEq/L (ref 137–147)

## 2014-10-20 LAB — POCT I-STAT, CHEM 8
BUN: 12 mg/dL (ref 6–23)
Calcium, Ion: 1.18 mmol/L (ref 1.13–1.30)
Chloride: 104 mEq/L (ref 96–112)
Creatinine, Ser: 0.9 mg/dL (ref 0.50–1.35)
Glucose, Bld: 112 mg/dL — ABNORMAL HIGH (ref 70–99)
HCT: 24 % — ABNORMAL LOW (ref 39.0–52.0)
Hemoglobin: 8.2 g/dL — ABNORMAL LOW (ref 13.0–17.0)
Potassium: 4.1 mEq/L (ref 3.7–5.3)
Sodium: 136 mEq/L — ABNORMAL LOW (ref 137–147)
TCO2: 20 mmol/L (ref 0–100)

## 2014-10-20 LAB — GLUCOSE, CAPILLARY
Glucose-Capillary: 106 mg/dL — ABNORMAL HIGH (ref 70–99)
Glucose-Capillary: 101 mg/dL — ABNORMAL HIGH (ref 70–99)
Glucose-Capillary: 107 mg/dL — ABNORMAL HIGH (ref 70–99)
Glucose-Capillary: 107 mg/dL — ABNORMAL HIGH (ref 70–99)
Glucose-Capillary: 108 mg/dL — ABNORMAL HIGH (ref 70–99)
Glucose-Capillary: 108 mg/dL — ABNORMAL HIGH (ref 70–99)
Glucose-Capillary: 113 mg/dL — ABNORMAL HIGH (ref 70–99)
Glucose-Capillary: 114 mg/dL — ABNORMAL HIGH (ref 70–99)
Glucose-Capillary: 115 mg/dL — ABNORMAL HIGH (ref 70–99)
Glucose-Capillary: 120 mg/dL — ABNORMAL HIGH (ref 70–99)
Glucose-Capillary: 122 mg/dL — ABNORMAL HIGH (ref 70–99)
Glucose-Capillary: 124 mg/dL — ABNORMAL HIGH (ref 70–99)
Glucose-Capillary: 125 mg/dL — ABNORMAL HIGH (ref 70–99)
Glucose-Capillary: 126 mg/dL — ABNORMAL HIGH (ref 70–99)
Glucose-Capillary: 88 mg/dL (ref 70–99)
Glucose-Capillary: 99 mg/dL (ref 70–99)

## 2014-10-20 LAB — CBC
HCT: 23.9 % — ABNORMAL LOW (ref 39.0–52.0)
HCT: 24.4 % — ABNORMAL LOW (ref 39.0–52.0)
Hemoglobin: 8.1 g/dL — ABNORMAL LOW (ref 13.0–17.0)
Hemoglobin: 8.2 g/dL — ABNORMAL LOW (ref 13.0–17.0)
MCH: 28.8 pg (ref 26.0–34.0)
MCH: 28.7 pg (ref 26.0–34.0)
MCHC: 33.9 g/dL (ref 30.0–36.0)
MCHC: 33.6 g/dL (ref 30.0–36.0)
MCV: 85.1 fL (ref 78.0–100.0)
MCV: 85.3 fL (ref 78.0–100.0)
PLATELETS: 97 10*3/uL — AB (ref 150–400)
Platelets: 105 10*3/uL — ABNORMAL LOW (ref 150–400)
RBC: 2.81 MIL/uL — AB (ref 4.22–5.81)
RBC: 2.86 MIL/uL — AB (ref 4.22–5.81)
RDW: 15.1 % (ref 11.5–15.5)
RDW: 15.5 % (ref 11.5–15.5)
WBC: 10.6 10*3/uL — AB (ref 4.0–10.5)
WBC: 11.1 10*3/uL — ABNORMAL HIGH (ref 4.0–10.5)

## 2014-10-20 LAB — MAGNESIUM
Magnesium: 2.7 mg/dL — ABNORMAL HIGH (ref 1.5–2.5)
Magnesium: 2.4 mg/dL (ref 1.5–2.5)

## 2014-10-20 MED ORDER — MILRINONE IN DEXTROSE 20 MG/100ML IV SOLN
0.0000 ug/kg/min | INTRAVENOUS | Status: DC
  Administered 2014-10-20: 0.1 ug/kg/min via INTRAVENOUS

## 2014-10-20 MED ORDER — FUROSEMIDE 10 MG/ML IJ SOLN
20.0000 mg | Freq: Four times a day (QID) | INTRAMUSCULAR | Status: AC
  Administered 2014-10-21 (×2): 20 mg via INTRAVENOUS
  Filled 2014-10-20: qty 2

## 2014-10-20 MED ORDER — SODIUM CHLORIDE 0.9 % IV SOLN: INTRAVENOUS | Status: DC | PRN

## 2014-10-20 MED ORDER — INSULIN ASPART 100 UNIT/ML ~~LOC~~ SOLN
0.0000 [IU] | SUBCUTANEOUS | Status: DC
  Administered 2014-10-20: 2 [IU] via SUBCUTANEOUS

## 2014-10-20 MED ORDER — DOPAMINE-DEXTROSE 3.2-5 MG/ML-% IV SOLN
0.0000 ug/kg/min | INTRAVENOUS | Status: DC
  Administered 2014-10-20: 3 ug/kg/min via INTRAVENOUS

## 2014-10-20 MED ORDER — INSULIN DETEMIR 100 UNIT/ML ~~LOC~~ SOLN
20.0000 [IU] | Freq: Two times a day (BID) | SUBCUTANEOUS | Status: DC
  Administered 2014-10-20 – 2014-10-21 (×3): 20 [IU] via SUBCUTANEOUS
  Filled 2014-10-20 (×4): qty 0.2

## 2014-10-20 MED ORDER — MORPHINE SULFATE 2 MG/ML IJ SOLN: 2.0000 mg | INTRAMUSCULAR | Status: DC | PRN

## 2014-10-20 MED ORDER — FUROSEMIDE 10 MG/ML IJ SOLN
20.0000 mg | Freq: Four times a day (QID) | INTRAMUSCULAR | Status: DC
  Administered 2014-10-20: 20 mg via INTRAVENOUS
  Filled 2014-10-20: qty 2

## 2014-10-20 NOTE — Progress Notes (Signed)
[address]       [place] [address]             [phone]        CARDIOTHORACIC SURGERY PROGRESS NOTE   R1 Day Post-Op Procedure(s) (LRB): CORONARY ARTERY BYPASS GRAFTING (CABG) (N/A) INTRAOPERATIVE TRANSESOPHAGEAL ECHOCARDIOGRAM (N/A) ARTERIAL LINE INSERTION (Left)  Subjective: Looks good.  Mild soreness in chest.  Objective: Vital signs: BP Readings from Last 1 Encounters:  10/20/14 104/51   Pulse Readings from Last 1 Encounters:  10/20/14 67   Resp Readings from Last 1 Encounters:  10/20/14 21   Temp Readings from Last 1 Encounters:  10/20/14 97.5 F (36.4 C)     Hemodynamics: PAP: (21-55)/(4-34) 29/9 mmHg CO:  [4.8 L/min-6.7 L/min] 6.7 L/min CI:  [2.4 L/min/m2-3.4 L/min/m2] 3.4 L/min/m2  Physical Exam:  Rhythm:   sinus  Breath sounds: clear  Heart sounds:  RRR  Incisions:  Dressing dry, intact  Abdomen:  Soft, non-distended, non-tender  Extremities:  Warm, well-perfused    Intake/Output from previous day: 11/13 0701 - 11/14 0700 In: 6995.1 [I.V.:5132.1; Blood:441; IV PRFFMBWGY:6599] Out: 3570 [Urine:2872; Blood:1050; Chest Tube:380] Intake/Output this shift: Total I/O In: 609.5 [P.O.:240; I.V.:319.5; IV Piggyback:50] Out: 275 [Urine:195; Chest Tube:80]  Lab Results:  CBC: Recent Labs  10/19/14 1858 10/19/14 1911 10/20/14 0430  WBC 13.9*  --  10.6*  HGB 8.4* 7.5* 8.1*  HCT 24.3* 22.0* 23.9*  PLT 107*  --  105*    BMET:  Recent Labs  10/19/14 0414  10/19/14 1911 10/20/14 0430  NA 143  < > 136* 141  K 4.6  < > 4.6 4.6  CL 106  < > 109 106  CO2 23  --   --  22  GLUCOSE 85  < > 123* 119*  BUN 16  < > 11 13  CREATININE 1.05  < > 1.00 1.00  CALCIUM 8.7  --   --  8.6  < > = values in this interval not displayed.   CBG (last 3)   Recent Labs  10/19/14 2054 10/19/14 2205 10/20/14 0043  GLUCAP 170* 140* 106*    ABG    Component Value Date/Time   PHART 7.387 10/19/2014 1907   PCO2ART 36.7  10/19/2014 1907   PO2ART 80.0 10/19/2014 1907   HCO3 22.2 10/19/2014 1907   TCO2 21 10/19/2014 1911   ACIDBASEDEF 3.0* 10/19/2014 1907   O2SAT 96.0 10/19/2014 1907    CXR: PORTABLE CHEST - 1 VIEW  COMPARISON: 10/19/2014  FINDINGS: Cardiac shadow is stable. Elevation of left hemidiaphragm is seen. Swan-Ganz catheter, mediastinal drain and left thoracostomy catheter remain. The endotracheal tube and nasogastric catheter have been removed. Mild left basilar atelectasis is noted. Interval clearing in the right lung base is noted.  IMPRESSION: Minimal left basilar atelectasis.  Tubes and lines as described.   Electronically Signed  By: Inez Catalina M.D.  On: 10/20/2014 08:21   Assessment/Plan: S/P Procedure(s) (LRB): CORONARY ARTERY BYPASS GRAFTING (CABG) (N/A) INTRAOPERATIVE TRANSESOPHAGEAL ECHOCARDIOGRAM (N/A) ARTERIAL LINE INSERTION (Left)  Overall doing well POD1 Maintaining NSR w/ stable hemodynamics on low dose milrinone and dopamine Chronic systolic heart failure Expected post op acute blood loss anemia, stable Expected post op atelectasis, L>R Expected post op volume excess, mild Type II diabetes mellitus, excellent glycemic control on insulin drip   Mobilize  D/C tubes and lines  Wean drips slowly  Diuresis  Add levemir insulin and wean drip   OWEN,CLARENCE H 10/20/2014 11:23  AM

## 2014-10-20 NOTE — Progress Notes (Signed)
[facility] BRIEF SICU PROGRESS NOTE  1 Day Post-Op  S/P Procedure(s) (LRB): CORONARY ARTERY BYPASS GRAFTING (CABG) (N/A) INTRAOPERATIVE TRANSESOPHAGEAL ECHOCARDIOGRAM (N/A) ARTERIAL LINE INSERTION (Left)   Stable day NSR w/ stable BP  UOP adequate  Plan: Continue current plan  Naksh Radi H 10/20/2014 8:13 PM

## 2014-10-20 NOTE — Anesthesia Postprocedure Evaluation (Signed)
  Anesthesia Post-op Note  Patient: Craig Page  Procedure(s) Performed: Procedure(s) with comments: CORONARY ARTERY BYPASS GRAFTING (CABG) (N/A) - Times 3 using left internal mammary artery and endoscopically harvested left saphenous vein INTRAOPERATIVE TRANSESOPHAGEAL ECHOCARDIOGRAM (N/A) ARTERIAL LINE INSERTION (Left) - Left femoral line   Patient Location: ICU  Anesthesia Type:General  Level of Consciousness: sedated  Airway and Oxygen Therapy: Patient remains intubated per anesthesia plan  Post-op Pain: none  Post-op Assessment: Post-op Vital signs reviewed, Patient's Cardiovascular Status Stable and Respiratory Function Stable  Post-op Vital Signs: Reviewed and stable  Last Vitals:  Filed Vitals:   10/20/14 0700  BP:   Pulse: 74  Temp: 36.5 C  Resp: 19    Complications: No apparent anesthesia complications

## 2014-10-21 ENCOUNTER — Inpatient Hospital Stay (HOSPITAL_COMMUNITY): Payer: Medicare HMO

## 2014-10-21 LAB — GLUCOSE, CAPILLARY
Glucose-Capillary: 105 mg/dL — ABNORMAL HIGH (ref 70–99)
Glucose-Capillary: 120 mg/dL — ABNORMAL HIGH (ref 70–99)
Glucose-Capillary: 131 mg/dL — ABNORMAL HIGH (ref 70–99)
Glucose-Capillary: 76 mg/dL (ref 70–99)
Glucose-Capillary: 85 mg/dL (ref 70–99)
Glucose-Capillary: 92 mg/dL (ref 70–99)
Glucose-Capillary: 94 mg/dL (ref 70–99)

## 2014-10-21 LAB — CBC
HCT: 26.7 % — ABNORMAL LOW (ref 39.0–52.0)
HEMOGLOBIN: 9.1 g/dL — AB (ref 13.0–17.0)
MCH: 28.9 pg (ref 26.0–34.0)
MCHC: 34.1 g/dL (ref 30.0–36.0)
MCV: 84.8 fL (ref 78.0–100.0)
Platelets: 108 10*3/uL — ABNORMAL LOW (ref 150–400)
RBC: 3.15 MIL/uL — AB (ref 4.22–5.81)
RDW: 15.8 % — ABNORMAL HIGH (ref 11.5–15.5)
WBC: 12.1 10*3/uL — ABNORMAL HIGH (ref 4.0–10.5)

## 2014-10-21 LAB — BASIC METABOLIC PANEL
ANION GAP: 16 — AB (ref 5–15)
BUN: 16 mg/dL (ref 6–23)
CHLORIDE: 96 meq/L (ref 96–112)
CO2: 22 meq/L (ref 19–32)
Calcium: 8.7 mg/dL (ref 8.4–10.5)
Creatinine, Ser: 0.9 mg/dL (ref 0.50–1.35)
GFR calc non Af Amer: 86 mL/min — ABNORMAL LOW (ref >90)
Glucose, Bld: 91 mg/dL (ref 70–99)
Potassium: 3.7 mEq/L (ref 3.7–5.3)
Sodium: 134 mEq/L — ABNORMAL LOW (ref 137–147)

## 2014-10-21 MED ORDER — INSULIN DETEMIR 100 UNIT/ML ~~LOC~~ SOLN
20.0000 [IU] | Freq: Every day | SUBCUTANEOUS | Status: DC
  Administered 2014-10-22: 20 [IU] via SUBCUTANEOUS
  Filled 2014-10-21 (×2): qty 0.2

## 2014-10-21 MED ORDER — POTASSIUM CHLORIDE CRYS ER 20 MEQ PO TBCR
20.0000 meq | EXTENDED_RELEASE_TABLET | Freq: Every day | ORAL | Status: DC
  Administered 2014-10-22 – 2014-10-23 (×2): 20 meq via ORAL
  Filled 2014-10-21 (×3): qty 1

## 2014-10-21 MED ORDER — SODIUM CHLORIDE 0.9 % IJ SOLN
3.0000 mL | INTRAMUSCULAR | Status: DC | PRN
  Administered 2014-10-24: 3 mL via INTRAVENOUS
  Filled 2014-10-21: qty 3

## 2014-10-21 MED ORDER — SODIUM CHLORIDE 0.9 % IV SOLN: 250.0000 mL | INTRAVENOUS | Status: DC | PRN

## 2014-10-21 MED ORDER — ATORVASTATIN CALCIUM 40 MG PO TABS
40.0000 mg | ORAL_TABLET | Freq: Every day | ORAL | Status: DC
  Administered 2014-10-21 – 2014-10-23 (×3): 40 mg via ORAL
  Filled 2014-10-21 (×5): qty 1

## 2014-10-21 MED ORDER — SODIUM CHLORIDE 0.9 % IJ SOLN
3.0000 mL | Freq: Two times a day (BID) | INTRAMUSCULAR | Status: DC
  Administered 2014-10-21 – 2014-10-24 (×4): 3 mL via INTRAVENOUS

## 2014-10-21 MED ORDER — MOVING RIGHT ALONG BOOK
Freq: Once | Status: AC
  Administered 2014-10-21: 13:00:00
  Filled 2014-10-21: qty 1

## 2014-10-21 MED ORDER — AMIODARONE HCL IN DEXTROSE 360-4.14 MG/200ML-% IV SOLN
INTRAVENOUS | Status: AC
  Administered 2014-10-21: 200 mL
  Filled 2014-10-21: qty 200

## 2014-10-21 MED ORDER — INSULIN ASPART 100 UNIT/ML ~~LOC~~ SOLN: 0.0000 [IU] | Freq: Three times a day (TID) | SUBCUTANEOUS | Status: DC

## 2014-10-21 MED ORDER — CARVEDILOL 6.25 MG PO TABS
6.2500 mg | ORAL_TABLET | Freq: Two times a day (BID) | ORAL | Status: DC
  Administered 2014-10-21 – 2014-10-22 (×3): 6.25 mg via ORAL
  Filled 2014-10-21 (×7): qty 1

## 2014-10-21 MED ORDER — FUROSEMIDE 40 MG PO TABS: 40.0000 mg | ORAL_TABLET | Freq: Every day | ORAL | Status: DC

## 2014-10-21 MED ORDER — ASPIRIN EC 325 MG PO TBEC
325.0000 mg | DELAYED_RELEASE_TABLET | Freq: Every day | ORAL | Status: DC
  Administered 2014-10-22 – 2014-10-24 (×3): 325 mg via ORAL
  Filled 2014-10-21 (×3): qty 1

## 2014-10-21 MED ORDER — LISINOPRIL 10 MG PO TABS
10.0000 mg | ORAL_TABLET | Freq: Every day | ORAL | Status: DC
  Administered 2014-10-22: 10 mg via ORAL
  Filled 2014-10-21 (×2): qty 1

## 2014-10-21 MED ORDER — POTASSIUM CHLORIDE 10 MEQ/50ML IV SOLN
10.0000 meq | INTRAVENOUS | Status: AC | PRN
  Administered 2014-10-21 (×3): 10 meq via INTRAVENOUS

## 2014-10-21 MED ORDER — POTASSIUM CHLORIDE CRYS ER 20 MEQ PO TBCR: 20.0000 meq | EXTENDED_RELEASE_TABLET | Freq: Every day | ORAL | Status: DC

## 2014-10-21 MED ORDER — CETYLPYRIDINIUM CHLORIDE 0.05 % MT LIQD
7.0000 mL | Freq: Two times a day (BID) | OROMUCOSAL | Status: DC
  Administered 2014-10-21 – 2014-10-23 (×4): 7 mL via OROMUCOSAL

## 2014-10-21 MED ORDER — AMIODARONE HCL 200 MG PO TABS
200.0000 mg | ORAL_TABLET | Freq: Two times a day (BID) | ORAL | Status: DC
  Administered 2014-10-21 – 2014-10-23 (×6): 200 mg via ORAL
  Filled 2014-10-21 (×8): qty 1

## 2014-10-21 MED ORDER — FUROSEMIDE 40 MG PO TABS
40.0000 mg | ORAL_TABLET | Freq: Every day | ORAL | Status: DC
  Administered 2014-10-22: 40 mg via ORAL
  Filled 2014-10-21 (×2): qty 1

## 2014-10-21 MED ORDER — ATORVASTATIN CALCIUM 40 MG PO TABS
40.0000 mg | ORAL_TABLET | Freq: Every day | ORAL | Status: DC
  Filled 2014-10-21: qty 1

## 2014-10-21 NOTE — Plan of Care (Signed)
Problem: Phase II - Intermediate Post-Op Goal: Maintain Hemodynamic Stability Outcome: Completed/Met Date Met:  10/21/14 Goal: Pain controlled with appropriate interventions Outcome: Completed/Met Date Met:  10/21/14 Goal: Advance Diet Outcome: Completed/Met Date Met:  10/21/14

## 2014-10-21 NOTE — Progress Notes (Addendum)
      [address]       [place] [address]             [phone]        CARDIOTHORACIC SURGERY PROGRESS NOTE   R2 Days Post-Op Procedure(s) (LRB): CORONARY ARTERY BYPASS GRAFTING (CABG) (N/A) INTRAOPERATIVE TRANSESOPHAGEAL ECHOCARDIOGRAM (N/A) ARTERIAL LINE INSERTION (Left)  Subjective: Doing well.  Mild soreness.  Ambulating well.  Objective: Vital signs: BP Readings from Last 1 Encounters:  10/21/14 112/62   Pulse Readings from Last 1 Encounters:  10/21/14 66   Resp Readings from Last 1 Encounters:  10/21/14 27   Temp Readings from Last 1 Encounters:  10/21/14 98 F (36.7 C) Oral    Hemodynamics:    Physical Exam:  Rhythm:   sinus  Breath sounds: clear  Heart sounds:  RRR  Incisions:  Clean and dry  Abdomen:  Soft, non-distended, non-tender  Extremities:  Warm, well-perfused    Intake/Output from previous day: 11/14 0701 - 11/15 0700 In: 1517.3 [P.O.:540; I.V.:877.3; IV Piggyback:100] Out: 1880 [Urine:1700; Chest Tube:180] Intake/Output this shift: Total I/O In: 201.8 [I.V.:101.8; IV Piggyback:100] Out: 1400 [Urine:1400]  Lab Results:  CBC: Recent Labs  10/20/14 1645 10/21/14 0820  WBC 11.1* 12.1*  HGB 8.2* 9.1*  HCT 24.4* 26.7*  PLT 97* 108*    BMET:  Recent Labs  10/20/14 0430 10/20/14 1641 10/20/14 1645 10/21/14 0820  NA 141 136*  --  134*  K 4.6 4.1  --  3.7  CL 106 104  --  96  CO2 22  --   --  22  GLUCOSE 119* 112*  --  91  BUN 13 12  --  16  CREATININE 1.00 0.90 0.92 0.90  CALCIUM 8.6  --   --  8.7     CBG (last 3)   Recent Labs  10/20/14 1941 10/21/14 0016 10/21/14 0404  GLUCAP 131* 105* 76    ABG    Component Value Date/Time   PHART 7.387 10/19/2014 1907   PCO2ART 36.7 10/19/2014 1907   PO2ART 80.0 10/19/2014 1907   HCO3 22.2 10/19/2014 1907   TCO2 20 10/20/2014 1641   ACIDBASEDEF 3.0* 10/19/2014 1907   O2SAT 96.0 10/19/2014 1907    CXR: PORTABLE CHEST - 1 VIEW  COMPARISON:  10/20/2014, 10/19/2014, and CT chest 10/17/2014  FINDINGS: Right IJ sheath terminates in the proximal superior vena cava. Interval removal of Swan-Ganz catheter, mediastinal drain, left-sided chest tube. Negative for pneumothorax. Status post median sternotomy for CABG. Stable cardiomegaly. Mild pulmonary vascular congestion is present. Areas of bibasilar and left mid lung atelectasis. Slight interstitial prominence bilaterally. No visible pleural effusion.  IMPRESSION: Cardiomegaly with mild pulmonary vascular congestion and probable mild interstitial edema.  Bibasilar and left mid lung atelectasis.   Electronically Signed  By: Curlene Dolphin M.D.  On: 10/21/2014 08:42   Assessment/Plan: S/P Procedure(s) (LRB): CORONARY ARTERY BYPASS GRAFTING (CABG) (N/A) INTRAOPERATIVE TRANSESOPHAGEAL ECHOCARDIOGRAM (N/A) ARTERIAL LINE INSERTION (Left)  Doing well POD2 Maintaining NSR w/ stable BP Chronic systolic heart failure Expected post op acute blood loss anemia, stable Expected post op atelectasis, improved Expected post op volume excess, mild Type II diabetes mellitus, excellent glycemic control    Mobilize  Diuresis  Restart Coreg at reduced dose  Restart low dose ACE-I tomorrow  Continue levemir insulin and change CBG's and SSI to ac/hs  Transfer 2W  Rexene Alberts 10/21/2014 12:43 PM

## 2014-10-22 ENCOUNTER — Encounter (HOSPITAL_COMMUNITY): Payer: Self-pay | Admitting: Cardiothoracic Surgery

## 2014-10-22 ENCOUNTER — Inpatient Hospital Stay (HOSPITAL_COMMUNITY): Payer: Medicare HMO

## 2014-10-22 LAB — TYPE AND SCREEN
ABO/RH(D): O POS
Antibody Screen: NEGATIVE
Unit division: 0
Unit division: 0

## 2014-10-22 LAB — BASIC METABOLIC PANEL
ANION GAP: 15 (ref 5–15)
BUN: 19 mg/dL (ref 6–23)
CALCIUM: 8.3 mg/dL — AB (ref 8.4–10.5)
CO2: 22 mEq/L (ref 19–32)
Chloride: 97 mEq/L (ref 96–112)
Creatinine, Ser: 0.93 mg/dL (ref 0.50–1.35)
GFR calc Af Amer: >90 mL/min (ref >90)
GFR, EST NON AFRICAN AMERICAN: 85 mL/min — AB (ref >90)
Glucose, Bld: 67 mg/dL — ABNORMAL LOW (ref 70–99)
POTASSIUM: 4.1 meq/L (ref 3.7–5.3)
SODIUM: 134 meq/L — AB (ref 137–147)

## 2014-10-22 LAB — CBC
HCT: 23.2 % — ABNORMAL LOW (ref 39.0–52.0)
Hemoglobin: 8 g/dL — ABNORMAL LOW (ref 13.0–17.0)
MCH: 29 pg (ref 26.0–34.0)
MCHC: 34.5 g/dL (ref 30.0–36.0)
MCV: 84.1 fL (ref 78.0–100.0)
Platelets: 115 10*3/uL — ABNORMAL LOW (ref 150–400)
RBC: 2.76 MIL/uL — ABNORMAL LOW (ref 4.22–5.81)
RDW: 15.8 % — ABNORMAL HIGH (ref 11.5–15.5)
WBC: 9.4 10*3/uL (ref 4.0–10.5)

## 2014-10-22 LAB — GLUCOSE, CAPILLARY
Glucose-Capillary: 94 mg/dL (ref 70–99)
Glucose-Capillary: 75 mg/dL (ref 70–99)
Glucose-Capillary: 76 mg/dL (ref 70–99)
Glucose-Capillary: 83 mg/dL (ref 70–99)

## 2014-10-22 MED ORDER — FE FUMARATE-B12-VIT C-FA-IFC PO CAPS
1.0000 | ORAL_CAPSULE | Freq: Three times a day (TID) | ORAL | Status: DC
  Administered 2014-10-22 – 2014-10-24 (×7): 1 via ORAL
  Filled 2014-10-22 (×10): qty 1

## 2014-10-22 MED ORDER — FUROSEMIDE 10 MG/ML IJ SOLN
40.0000 mg | Freq: Once | INTRAMUSCULAR | Status: AC
  Administered 2014-10-22: 40 mg via INTRAVENOUS
  Filled 2014-10-22: qty 4

## 2014-10-22 MED ORDER — MAGNESIUM HYDROXIDE 400 MG/5ML PO SUSP
30.0000 mL | Freq: Once | ORAL | Status: AC
  Administered 2014-10-22: 30 mL via ORAL
  Filled 2014-10-22: qty 30

## 2014-10-22 MED FILL — Potassium Chloride Inj 2 mEq/ML: INTRAVENOUS | Qty: 40 | Status: AC

## 2014-10-22 MED FILL — Sodium Bicarbonate IV Soln 8.4%: INTRAVENOUS | Qty: 50 | Status: AC

## 2014-10-22 MED FILL — Electrolyte-R (PH 7.4) Solution: INTRAVENOUS | Qty: 4000 | Status: AC

## 2014-10-22 MED FILL — Mannitol IV Soln 20%: INTRAVENOUS | Qty: 500 | Status: AC

## 2014-10-22 MED FILL — Heparin Sodium (Porcine) Inj 1000 Unit/ML: INTRAMUSCULAR | Qty: 10 | Status: AC

## 2014-10-22 MED FILL — Calcium Chloride Inj 10%: INTRAVENOUS | Qty: 10 | Status: AC

## 2014-10-22 MED FILL — Magnesium Sulfate Inj 50%: INTRAMUSCULAR | Qty: 10 | Status: AC

## 2014-10-22 MED FILL — Lidocaine HCl IV Inj 20 MG/ML: INTRAVENOUS | Qty: 5 | Status: AC

## 2014-10-22 MED FILL — Sodium Chloride IV Soln 0.9%: INTRAVENOUS | Qty: 2000 | Status: AC

## 2014-10-22 MED FILL — Heparin Sodium (Porcine) Inj 1000 Unit/ML: INTRAMUSCULAR | Qty: 30 | Status: AC

## 2014-10-22 NOTE — Progress Notes (Addendum)
[address]       [place] [address]             [phone]      3 Days Post-Op Procedure(s) (LRB): CORONARY ARTERY BYPASS GRAFTING (CABG) (N/A) INTRAOPERATIVE TRANSESOPHAGEAL ECHOCARDIOGRAM (N/A) ARTERIAL LINE INSERTION (Left) Subjective: Feeling well overall  Objective: Vital signs in last 24 hours: Temp:  [98 F (36.7 C)-98.9 F (37.2 C)] 98 F (36.7 C) (11/16 0155) Pulse Rate:  [61-82] 61 (11/16 1004) Cardiac Rhythm:  [-] Normal sinus rhythm (11/16 0801) Resp:  [18-22] 20 (11/16 0155) BP: (117-144)/(52-73) 128/52 mmHg (11/16 1004) SpO2:  [87 %-94 %] 93 % (11/16 0155) Weight:  [185 lb 3 oz (84 kg)] 185 lb 3 oz (84 kg) (11/16 6283)  Hemodynamic parameters for last 24 hours:    Intake/Output from previous day: 11/15 0701 - 11/16 0700 In: 721.9 [P.O.:360; I.V.:161.9; IV Piggyback:200] Out: 1770 [Urine:1770] Intake/Output this shift: Total I/O In: 120 [P.O.:120] Out: 250 [Urine:250]  General appearance: alert, cooperative and no distress Heart: regular rate and rhythm Lungs: clear to auscultation bilaterally Abdomen: benign Extremities: minor edema Wound: incis healing well  Lab Results:  Recent Labs  10/21/14 0820 10/22/14 0305  WBC 12.1* 9.4  HGB 9.1* 8.0*  HCT 26.7* 23.2*  PLT 108* 115*   BMET:  Recent Labs  10/21/14 0820 10/22/14 0305  NA 134* 134*  K 3.7 4.1  CL 96 97  CO2 22 22  GLUCOSE 91 67*  BUN 16 19  CREATININE 0.90 0.93  CALCIUM 8.7 8.3*    PT/INR:  Recent Labs  10/19/14 1325  LABPROT 18.4*  INR 1.52*   ABG    Component Value Date/Time   PHART 7.387 10/19/2014 1907   HCO3 22.2 10/19/2014 1907   TCO2 20 10/20/2014 1641   ACIDBASEDEF 3.0* 10/19/2014 1907   O2SAT 96.0 10/19/2014 1907   CBG (last 3)   Recent Labs  10/21/14 2118 10/22/14 0536 10/22/14 1132  GLUCAP 92 94 75    Meds Scheduled Meds: . acetaminophen  1,000 mg Oral 4 times per day  . amiodarone  200 mg Oral BID  .  antiseptic oral rinse  7 mL Mouth Rinse BID  . aspirin EC  325 mg Oral Daily  . atorvastatin  40 mg Oral QHS  . bisacodyl  10 mg Oral Daily   Or  . bisacodyl  10 mg Rectal Daily  . budesonide-formoterol  2 puff Inhalation BID  . carvedilol  6.25 mg Oral BID  . docusate sodium  200 mg Oral Daily  . ferrous [id number] C-folic acid  1 capsule Oral TID PC  . furosemide  40 mg Intravenous Once  . furosemide  40 mg Oral Daily  . insulin aspart  0-15 Units Subcutaneous TID WC  . insulin detemir  20 Units Subcutaneous Daily  . levalbuterol  1.25 mg Nebulization 4 times per day  . lisinopril  10 mg Oral Daily  . magnesium hydroxide  30 mL Oral Once  . multivitamin with minerals  1 tablet Oral Daily  . pantoprazole  40 mg Oral Daily  . potassium chloride SA  20 mEq Oral Daily  . sodium chloride  3 mL Intravenous Q12H  . sodium chloride  3 mL Intravenous Q12H   Continuous Infusions: . sodium chloride     PRN Meds:.sodium chloride, metoCLOPramide (REGLAN) injection, metoprolol, ondansetron (ZOFRAN) IV, oxyCODONE, sodium chloride, sodium chloride, traMADol  Xrays Dg Chest 2 View  10/22/2014  CLINICAL DATA:  Status post CABG now with significant chest soreness as well as weakness  EXAM: CHEST  2 VIEW  COMPARISON:  Portable chest x-ray of October 21, 2014  FINDINGS: The lungs are better inflated. The interstitial markings are slightly less conspicuous. There is persistent increased retrocardiac density on the left consistent with atelectasis or pneumonia. There are small bilateral pleural effusions. The cardiac silhouette remains enlarged. The pulmonary vascularity is more distinct today. There are 6 intact sternal wires present. The bony thorax unremarkable otherwise.  IMPRESSION: Left lower lobe atelectasis or pneumonia. There are small bilateral pleural effusions. Pulmonary interstitial edema has improved further since yesterday's study.   Electronically Signed   By: David  Martinique    On: 10/22/2014 08:48   Dg Chest Port 1 View  10/21/2014   CLINICAL DATA:  Atelectasis.  History of CABG 10/19/14  EXAM: PORTABLE CHEST - 1 VIEW  COMPARISON:  10/20/2014, 10/19/2014, and CT chest 10/17/2014  FINDINGS: Right IJ sheath terminates in the proximal superior vena cava. Interval removal of Swan-Ganz catheter, mediastinal drain, left-sided chest tube. Negative for pneumothorax. Status post median sternotomy for CABG. Stable cardiomegaly. Mild pulmonary vascular congestion is present. Areas of bibasilar and left mid lung atelectasis. Slight interstitial prominence bilaterally. No visible pleural effusion.  IMPRESSION: Cardiomegaly with mild pulmonary vascular congestion and probable mild interstitial edema.  Bibasilar and left mid lung atelectasis.   Electronically Signed   By: Curlene Dolphin M.D.   On: 10/21/2014 08:42    Assessment/Plan: S/P Procedure(s) (LRB): CORONARY ARTERY BYPASS GRAFTING (CABG) (N/A) INTRAOPERATIVE TRANSESOPHAGEAL ECHOCARDIOGRAM (N/A) ARTERIAL LINE INSERTION (Left)  1 good overall progress 2 exp acute blood loss anemia- cont to monitor closely 3 cont gentle fiuresis 4 push rehab/pulm toilet as able 5 sugars controlled, d/c detemir insulin soon- on no home DM meds- HGB A1C 5.7 6 BP pretty wellcontrolled, on ACEI 7 on statin 8 rhythm stable, occ PVC's , QTc in 480's  LOS: 6 days    GOLD,WAYNE E 10/22/2014  patient examined and medical record reviewed,agree with above note. VAN TRIGT III,Sinclair Alligood 10/22/2014

## 2014-10-22 NOTE — Progress Notes (Signed)
Medicare Important Message given? YES  (If response is "NO", the following Medicare IM given date fields will be blank)  Date Medicare IM given: 10/22/14 Medicare IM given by:  Dahlia Client [employer]

## 2014-10-22 NOTE — Progress Notes (Signed)
Advanced Heart Failure Rounding Note   Subjective:    POD # 3 CABG  Feels good. Sitting in chair. No SOB. Chest sore. No BM yet. Passing gas.   Echo: 30% RV normal RVSP 33  Carotids: R total occlusion L moderate disease ABIs: 0.5-0.6 range bilaterally PFTs: FEV1 3.5L, FVC 4.6L DLCO 39%  Chest CT: mild COPD ABG: 7.46/32/82/96% (RA)   Objective:   Weight Range:  Vital Signs:   Temp:  [98 F (36.7 C)-98.9 F (37.2 C)] 98 F (36.7 C) (11/16 0155) Pulse Rate:  [61-82] 61 (11/16 1004) Resp:  [18-22] 20 (11/16 0155) BP: (117-144)/(52-73) 128/52 mmHg (11/16 1004) SpO2:  [87 %-94 %] 93 % (11/16 0155) Weight:  [185 lb 3 oz (84 kg)] 185 lb 3 oz (84 kg) (11/16 0643) Last BM Date: 10/18/14  Weight change: Filed Weights   10/20/14 0500 10/21/14 0500 10/22/14 0643  Weight: 198 lb 6.6 oz (90 kg) 197 lb 15.6 oz (89.8 kg) 185 lb 3 oz (84 kg)    Intake/Output:   Intake/Output Summary (Last 24 hours) at 10/22/14 1240 Last data filed at 10/22/14 1136  Gross per 24 hour  Intake  563.4 ml  Output    580 ml  Net  -16.6 ml      Physical Exam: General: Well appearing. No resp difficulty, sitting up in chair HEENT: normal Neck: supple. JVP 9. Carotids 2+ bilat; + bilateral bruits. No lymphadenopathy or thryomegaly appreciated. Cor: Dressing in place. RRR 2/6 SEM RSB Lungs: clear long exp phase Abdomen: soft, nontender, + distended. Hypoactive  bowel sounds. Extremities: no cyanosis, clubbing, rash,  DPs nonpalpable bilaterally. No sores. 2+ edema Neuro: alert & orientedx3, cranial nerves grossly intact. moves all 4 extremities w/o difficulty. Affect pleasant   Labs: Basic Metabolic Panel:  Recent Labs Lab 10/16/14 1425 10/19/14 0414  10/19/14 1858 10/19/14 1911 10/20/14 0430 10/20/14 1641 10/20/14 1645 10/21/14 0820 10/22/14 0305  NA 140 143  < >  --  136* 141 136*  --  134* 134*  K 4.4 4.6  < >  --  4.6 4.6 4.1  --  3.7 4.1  CL 99 106  < >  --  109 106 104  --  96  97  CO2 24 23  --   --   --  22  --   --  22 22  GLUCOSE 142* 85  < >  --  123* 119* 112*  --  91 67*  BUN 22 16  < >  --  11 13 12   --  16 19  CREATININE 1.19 1.05  < > 0.99 1.00 1.00 0.90 0.92 0.90 0.93  CALCIUM 9.3 8.7  --   --   --  8.6  --   --  8.7 8.3*  MG 2.2  --   --  3.1*  --  2.7*  --  2.4  --   --   < > = values in this interval not displayed.  Liver Function Tests:  Recent Labs Lab 10/16/14 1425  AST 13  ALT 11  ALKPHOS 100  BILITOT 0.5  PROT 7.0  ALBUMIN 3.6   No results for input(s): LIPASE, AMYLASE in the last 168 hours. No results for input(s): AMMONIA in the last 168 hours.  CBC:  Recent Labs Lab 10/19/14 1858  10/20/14 0430 10/20/14 1641 10/20/14 1645 10/21/14 0820 10/22/14 0305  WBC 13.9*  --  10.6*  --  11.1* 12.1* 9.4  HGB 8.4*  < >  8.1* 8.2* 8.2* 9.1* 8.0*  HCT 24.3*  < > 23.9* 24.0* 24.4* 26.7* 23.2*  MCV 82.7  --  85.1  --  85.3 84.8 84.1  PLT 107*  --  105*  --  97* 108* 115*  < > = values in this interval not displayed.  Cardiac Enzymes: No results for input(s): CKTOTAL, CKMB, CKMBINDEX, TROPONINI in the last 168 hours.  BNP: BNP (last 3 results) No results for input(s): PROBNP in the last 8760 hours.   Other results:    Imaging: Dg Chest 2 View  10/22/2014   CLINICAL DATA:  Status post CABG now with significant chest soreness as well as weakness  EXAM: CHEST  2 VIEW  COMPARISON:  Portable chest x-ray of October 21, 2014  FINDINGS: The lungs are better inflated. The interstitial markings are slightly less conspicuous. There is persistent increased retrocardiac density on the left consistent with atelectasis or pneumonia. There are small bilateral pleural effusions. The cardiac silhouette remains enlarged. The pulmonary vascularity is more distinct today. There are 6 intact sternal wires present. The bony thorax unremarkable otherwise.  IMPRESSION: Left lower lobe atelectasis or pneumonia. There are small bilateral pleural effusions.  Pulmonary interstitial edema has improved further since yesterday's study.   Electronically Signed   By: David  Martinique   On: 10/22/2014 08:48   Dg Chest Port 1 View  10/21/2014   CLINICAL DATA:  Atelectasis.  History of CABG 10/19/14  EXAM: PORTABLE CHEST - 1 VIEW  COMPARISON:  10/20/2014, 10/19/2014, and CT chest 10/17/2014  FINDINGS: Right IJ sheath terminates in the proximal superior vena cava. Interval removal of Swan-Ganz catheter, mediastinal drain, left-sided chest tube. Negative for pneumothorax. Status post median sternotomy for CABG. Stable cardiomegaly. Mild pulmonary vascular congestion is present. Areas of bibasilar and left mid lung atelectasis. Slight interstitial prominence bilaterally. No visible pleural effusion.  IMPRESSION: Cardiomegaly with mild pulmonary vascular congestion and probable mild interstitial edema.  Bibasilar and left mid lung atelectasis.   Electronically Signed   By: Curlene Dolphin M.D.   On: 10/21/2014 08:42     Medications:     Scheduled Medications: . acetaminophen  1,000 mg Oral 4 times per day  . amiodarone  200 mg Oral BID  . antiseptic oral rinse  7 mL Mouth Rinse BID  . aspirin EC  325 mg Oral Daily  . atorvastatin  40 mg Oral QHS  . bisacodyl  10 mg Oral Daily   Or  . bisacodyl  10 mg Rectal Daily  . budesonide-formoterol  2 puff Inhalation BID  . carvedilol  6.25 mg Oral BID  . docusate sodium  200 mg Oral Daily  . ferrous [id number] C-folic acid  1 capsule Oral TID PC  . furosemide  40 mg Oral Daily  . insulin aspart  0-15 Units Subcutaneous TID WC  . insulin detemir  20 Units Subcutaneous Daily  . levalbuterol  1.25 mg Nebulization 4 times per day  . lisinopril  10 mg Oral Daily  . magnesium hydroxide  30 mL Oral Once  . multivitamin with minerals  1 tablet Oral Daily  . pantoprazole  40 mg Oral Daily  . potassium chloride SA  20 mEq Oral Daily  . sodium chloride  3 mL Intravenous Q12H  . sodium chloride  3 mL Intravenous  Q12H    Infusions: . 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 L moderate 7) Mild COPD with severely reduced DLCO  Plan/Discussion:    Doing well. Weight up post-op. Continue diuresis. Will give milk of mag for constipation.   Length of Stay: 6  Glori Bickers MD 10/22/2014, 12:40 PM  Advanced Heart Failure Team Pager [phone] (M-F; [facility])  Please contact [facility] for night-coverage after hours (4p -7a ) and weekends on [email or web address]

## 2014-10-22 NOTE — Progress Notes (Signed)
CARDIAC REHAB PHASE I   PRE:  Rate/Rhythm: 65 SR  BP:  Supine:   Sitting: 124/70  Standing:    SaO2: 94%RA  MODE:  Ambulation: 690 ft   POST:  Rate/Rhythm: 83 SR  BP:  Supine:   Sitting: 142/80  Standing:    SaO2: 93%RA 1035-1110 Pt walked 690 ft on RA with rolling walker and minimal asst with steady gait. Tolerated well. To recliner after walk.call bell in reach.   Graylon Good, RN BSN  10/22/2014 11:00 AM

## 2014-10-23 ENCOUNTER — Inpatient Hospital Stay (HOSPITAL_COMMUNITY): Payer: Medicare HMO

## 2014-10-23 LAB — CBC
HCT: 23.8 % — ABNORMAL LOW (ref 39.0–52.0)
Hemoglobin: 7.9 g/dL — ABNORMAL LOW (ref 13.0–17.0)
MCH: 27.4 pg (ref 26.0–34.0)
MCHC: 33.2 g/dL (ref 30.0–36.0)
MCV: 82.6 fL (ref 78.0–100.0)
Platelets: 153 10*3/uL (ref 150–400)
RBC: 2.88 MIL/uL — ABNORMAL LOW (ref 4.22–5.81)
RDW: 15.7 % — ABNORMAL HIGH (ref 11.5–15.5)
WBC: 9 10*3/uL (ref 4.0–10.5)

## 2014-10-23 LAB — GLUCOSE, CAPILLARY: Glucose-Capillary: 90 mg/dL (ref 70–99)

## 2014-10-23 MED ORDER — FUROSEMIDE 10 MG/ML IJ SOLN
40.0000 mg | Freq: Once | INTRAMUSCULAR | Status: AC
  Administered 2014-10-23: 40 mg via INTRAVENOUS
  Filled 2014-10-23: qty 4

## 2014-10-23 MED ORDER — FUROSEMIDE 40 MG PO TABS
40.0000 mg | ORAL_TABLET | Freq: Every day | ORAL | Status: DC
  Filled 2014-10-23: qty 1

## 2014-10-23 MED ORDER — LISINOPRIL 2.5 MG PO TABS
2.5000 mg | ORAL_TABLET | Freq: Every day | ORAL | Status: DC
  Administered 2014-10-23 – 2014-10-24 (×2): 2.5 mg via ORAL
  Filled 2014-10-23 (×2): qty 1

## 2014-10-23 MED ORDER — SPIRONOLACTONE 12.5 MG HALF TABLET
12.5000 mg | ORAL_TABLET | Freq: Every day | ORAL | Status: DC
  Administered 2014-10-23 – 2014-10-24 (×2): 12.5 mg via ORAL
  Filled 2014-10-23 (×2): qty 1

## 2014-10-23 MED ORDER — CARVEDILOL 3.125 MG PO TABS
3.1250 mg | ORAL_TABLET | Freq: Two times a day (BID) | ORAL | Status: DC
  Administered 2014-10-23 – 2014-10-24 (×3): 3.125 mg via ORAL
  Filled 2014-10-23 (×5): qty 1

## 2014-10-23 NOTE — Progress Notes (Signed)
Came to visit patient at bedside to offer [facility] [employer] services as benefit of his [employer]. Patient agreeable to services and will receive post hospital discharge call and will be evaluated for monthly home visits. Patient could benefit from [facility] RN services as well. Consents obtained. Will report to inpatient RNCM. Left [facility] Care Management packet at bedside.  Marthenia Rolling, MSN-RN,BSN- [facility] [phone]

## 2014-10-23 NOTE — Discharge Instructions (Signed)
Endoscopic Saphenous Vein Harvesting °Care After °Refer to this sheet in the next few weeks. These instructions provide you with information on caring for yourself after your procedure. Your health care provider may also give you more specific instructions. Your treatment has been planned according to current medical practices, but problems sometimes occur. Call your health care provider if you have any problems or questions after your procedure. °HOME CARE INSTRUCTIONS °Medicine °· Take whatever pain medicine your surgeon prescribes. Follow the directions carefully. Do not take over-the-counter pain medicine unless your surgeon says it is okay. Some pain medicine can cause bleeding problems for several weeks after surgery. °· Follow your surgeon's instructions about driving. You will probably not be permitted to drive after heart surgery. °· Take any medicines your surgeon prescribes. Any medicines you took before your heart surgery should be checked with your health care provider before you start taking them again. °Wound care °· If your surgeon has prescribed an elastic bandage or stocking, ask how long you should wear it. °· Check the area around your surgical cuts (incisions) whenever your bandages (dressings) are changed. Look for any redness or swelling. °· You will need to return to have the stitches (sutures) or staples taken out. Ask your surgeon when to do that. °· Ask your surgeon when you can shower or bathe. °Activity °· Try to keep your legs raised when you are sitting. °· Do any exercises your health care providers have given you. These may include deep breathing exercises, coughing, walking, or other exercises. °SEEK MEDICAL CARE IF: °· You have any questions about your medicines. °· You have more leg pain, especially if your pain medicine stops working. °· New or growing bruises develop on your leg. °· Your leg swells, feels tight, or becomes red. °· You have numbness in your leg. °SEEK IMMEDIATE  MEDICAL CARE IF: °· Your pain gets much worse. °· Blood or fluid leaks from any of the incisions. °· Your incisions become warm, swollen, or red. °· You have chest pain. °· You have trouble breathing. °· You have a fever. °· You have more pain near your leg incision. °MAKE SURE YOU: °· Understand these instructions. °· Will watch your condition. °· Will get help right away if you are not doing well or get worse. °Document Released: 08/05/2011 Document Revised: 11/28/2013 Document Reviewed: 08/05/2011 °ExitCare® Patient Information ©2015 [employer]. This information is not intended to replace advice given to you by your health care provider. Make sure you discuss any questions you have with your health care provider. °Coronary Artery Bypass Grafting, Care After °These instructions give you information on caring for yourself after your procedure. Your doctor may also give you more specific instructions. Call your doctor if you have any problems or questions after your procedure.  °HOME CARE °· Only take medicine as told by your doctor. Take medicines exactly as told. Do not stop taking medicines or start any new medicines without talking to your doctor first. °· Take your pulse as told by your doctor. °· Do deep breathing as told by your doctor. Use your breathing device (incentive spirometer), if given, to practice deep breathing several times a day. Support your chest with a pillow or your arms when you take deep breaths or cough. °· Keep the area clean, dry, and protected where the surgery cuts (incisions) were made. Remove bandages (dressings) only as told by your doctor. If strips were applied to surgical area, do not take them off. They fall off   on their own. °· Check the surgery area daily for puffiness (swelling), redness, or leaking fluid. °· If surgery cuts were made in your legs: °· Avoid crossing your legs. °· Avoid sitting for long periods of time. Change positions every 30 minutes. °· Raise your legs  when you are sitting. Place them on pillows. °· Wear stockings that help keep blood clots from forming in your legs (compression stockings). °· Only take sponge baths until your doctor says it is okay to take showers. Pat the surgery area dry. Do not rub the surgery area with a washcloth or towel. Do not bathe, swim, or use a hot tub until your doctor says it is okay. °· Eat foods that are high in fiber. These include raw fruits and vegetables, whole grains, beans, and nuts. Choose lean meats. Avoid canned, processed, and fried foods. °· Drink enough fluids to keep your pee (urine) clear or pale yellow. °· Weigh yourself every day. °· Rest and limit activity as told by your doctor. You may be told to: °· Stop any activity if you have chest pain, shortness of breath, changes in heartbeat, or dizziness. Get help right away if this happens. °· Move around often for short amounts of time or take short walks as told by your doctor. Gradually become more active. You may need help to strengthen your muscles and build endurance. °· Avoid lifting, pushing, or pulling anything heavier than 10 pounds (4.5 kg) for at least 6 weeks after surgery. °· Do not drive until your doctor says it is okay. °· Ask your doctor when you can go back to work. °· Ask your doctor when you can begin sexual activity again. °· Follow up with your doctor as told. °GET HELP IF: °· You have puffiness, redness, more pain, or fluid draining from the incision site. °· You have a fever. °· You have puffiness in your ankles or legs. °· You have pain in your legs. °· You gain 2 or more pounds (0.9 kg) a day. °· You feel sick to your stomach (nauseous) or throw up (vomit). °· You have watery poop (diarrhea). °GET HELP RIGHT AWAY IF: °· You have chest pain that goes to your jaw or arms. °· You have shortness of breath. °· You have a fast or irregular heartbeat. °· You notice a "clicking" in your breastbone when you move. °· You have numbness or weakness in  your arms or legs. °· You feel dizzy or light-headed. °MAKE SURE YOU: °· Understand these instructions. °· Will watch your condition. °· Will get help right away if you are not doing well or get worse. °Document Released: 11/28/2013 Document Reviewed: 11/28/2013 °ExitCare® Patient Information ©2015 [employer]. This information is not intended to replace advice given to you by your health care provider. Make sure you discuss any questions you have with your health care provider. ° °

## 2014-10-23 NOTE — Progress Notes (Addendum)
      [address]       [place] [address]             [phone]        4 Days Post-Op Procedure(s) (LRB): CORONARY ARTERY BYPASS GRAFTING (CABG) (N/A) INTRAOPERATIVE TRANSESOPHAGEAL ECHOCARDIOGRAM (N/A) ARTERIAL LINE INSERTION (Left)  Subjective: Patient about to eat breakfast. He has no complaints.  Objective: Vital signs in last 24 hours: Temp:  [98 F (36.7 C)-98.6 F (37 C)] 98.6 F (37 C) (11/17 0314) Pulse Rate:  [60-62] 61 (11/17 0314) Cardiac Rhythm:  [-] Normal sinus rhythm (11/16 2000) Resp:  [18] 18 (11/17 0314) BP: (103-128)/(52-66) 103/62 mmHg (11/17 0314) SpO2:  [93 %-100 %] 100 % (11/17 0730) Weight:  [183 lb 10.3 oz (83.3 kg)] 183 lb 10.3 oz (83.3 kg) (11/17 0447)  Pre op weight 79 kg Current Weight  10/23/14 183 lb 10.3 oz (83.3 kg)       Intake/Output from previous day: 11/16 0701 - 11/17 0700 In: 820 [P.O.:820] Out: 900 [Urine:900]   Physical Exam:  Cardiovascular: RRR Pulmonary: Slightly diminished at bases; no rales, wheezes, or rhonchi. Abdomen: Soft, non tender, bowel sounds present. Extremities: Mild bilateral lower extremity edema. Wounds: Clean and dry.  No erythema or signs of infection.  Lab Results: CBC: Recent Labs  10/22/14 0305 10/23/14 0439  WBC 9.4 9.0  HGB 8.0* 7.9*  HCT 23.2* 23.8*  PLT 115* 153   BMET:  Recent Labs  10/21/14 0820 10/22/14 0305  NA 134* 134*  K 3.7 4.1  CL 96 97  CO2 22 22  GLUCOSE 91 67*  BUN 16 19  CREATININE 0.90 0.93  CALCIUM 8.7 8.3*    PT/INR:  Lab Results  Component Value Date   INR 1.52* 10/19/2014   INR 1.09 10/16/2014   ABG:  INR: Will add last result for INR, ABG once components are confirmed Will add last 4 CBG results once components are confirmed  Assessment/Plan:  1. CV - Previous a fib. Maintaining SR in the 60's. On Amiodarone 200 bid, Coreg 6.25 bid, and Lisinopril 10 daily. Will decrease Lisinopril to 2.5 daily as BP labile and Coreg to  3.125 bid as HR in the low 60's. Will decrease Amiodarone to daily at discharge. 2.  Pulmonary - Mild COPD.Continue Symbicort and Xopenex. CXR this am appears to show cardiomegaly, left lower lobe atelectasis, small bilateral pleural effusions, and pulmonary edema. Encourage incentive spirometer 3. Volume Overload - On Lasix 40 daily. Will give IV today. 4.  Acute blood loss anemia - H and H 7.9 and 23.8 this am.  Continue Trinsicon 5. Thrombocytopenia resolved-platelets up to 153,000. 6. CBGs 76/83/90. Pre op HGA1C 5.6. Likely pre diabetic. Will stop Insulin, accu checks, and SS PRN. 7. Remove EPW 8. Likely discharge in am  ZIMMERMAN,DONIELLE MPA-C 10/23/2014,7:38 AM  patient examined and medical record reviewed,agree with above note. VAN TRIGT III,Obbie Lewallen 10/23/2014

## 2014-10-23 NOTE — Progress Notes (Signed)
CARDIAC REHAB PHASE I   PRE:  Rate/Rhythm: 64 SR     BP: sitting 84/46    SaO2: 94 RA  MODE:  Ambulation: 550 ft   POST:  Rate/Rhythm: 88 SR    BP: sitting 102/66     SaO2: 97 RA  Pt needed assist to get to EOB from lying. Instructed on mechanics. Slightly unsteady walking without RW, able to correct himself. Pt is trying to decide if he wants RW for home. Pt with increased pace, therefore needed to rest x1 due to SOB/fatigue. Return to bed. Encouraged pt to get to recliner later. BP low before walk, stable after walk.  [place], [place], ACSM 10/23/2014 2:08 PM

## 2014-10-23 NOTE — Progress Notes (Signed)
EPW removed per protocol, wires intact patient tolerated well. Instructed bedrest for 1 hour. Frequent routine vital signs began, 108/59 heart rate 88, room air sat 90% Joylene Draft A

## 2014-10-23 NOTE — Progress Notes (Addendum)
Advanced Heart Failure Rounding Note   Subjective:    POD # 4 CABG  Feels good. Diuresing well with IV lasix. Weight still up about 6-8 pounds.   Echo: 30% RV normal RVSP 33  Carotids: R total occlusion L moderate disease ABIs: 0.5-0.6 range bilaterally PFTs: FEV1 3.5L, FVC 4.6L DLCO 39%  Chest CT: mild COPD ABG: 7.46/32/82/96% (RA)   Objective:   Weight Range:  Vital Signs:   Temp:  [97.5 F (36.4 C)-98.6 F (37 C)] 97.5 F (36.4 C) (11/17 1033) Pulse Rate:  [59-65] 59 (11/17 1300) Resp:  [18-20] 20 (11/17 1152) BP: (95-122)/(45-66) 116/50 mmHg (11/17 1300) SpO2:  [90 %-100 %] 94 % (11/17 1300) Weight:  [83.3 kg (183 lb 10.3 oz)] 83.3 kg (183 lb 10.3 oz) (11/17 0447) Last BM Date: 10/23/14  Weight change: Filed Weights   10/21/14 0500 10/22/14 0643 10/23/14 0447  Weight: 89.8 kg (197 lb 15.6 oz) 84 kg (185 lb 3 oz) 83.3 kg (183 lb 10.3 oz)    Intake/Output:   Intake/Output Summary (Last 24 hours) at 10/23/14 1310 Last data filed at 10/23/14 1224  Gross per 24 hour  Intake    780 ml  Output   1050 ml  Net   -270 ml      Physical Exam: General: Well appearing. No resp difficulty, sitting up in chair HEENT: normal Neck: supple. JVP 9. Carotids 2+ bilat; + bilateral bruits. No lymphadenopathy or thryomegaly appreciated. Cor: Dressing in place. RRR 2/6 SEM RSB Lungs: clear long exp phase Abdomen: soft, nontender, + distended. Hypoactive  bowel sounds. Extremities: no cyanosis, clubbing, rash,  DPs nonpalpable bilaterally. No sores. 1+ edema Neuro: alert & orientedx3, cranial nerves grossly intact. moves all 4 extremities w/o difficulty. Affect pleasant   Labs: Basic Metabolic Panel:  Recent Labs Lab 10/16/14 1425 10/19/14 0414  10/19/14 1858 10/19/14 1911 10/20/14 0430 10/20/14 1641 10/20/14 1645 10/21/14 0820 10/22/14 0305  NA 140 143  < >  --  136* 141 136*  --  134* 134*  K 4.4 4.6  < >  --  4.6 4.6 4.1  --  3.7 4.1  CL 99 106  < >  --  109  106 104  --  96 97  CO2 24 23  --   --   --  22  --   --  22 22  GLUCOSE 142* 85  < >  --  123* 119* 112*  --  91 67*  BUN 22 16  < >  --  11 13 12   --  16 19  CREATININE 1.19 1.05  < > 0.99 1.00 1.00 0.90 0.92 0.90 0.93  CALCIUM 9.3 8.7  --   --   --  8.6  --   --  8.7 8.3*  MG 2.2  --   --  3.1*  --  2.7*  --  2.4  --   --   < > = values in this interval not displayed.  Liver Function Tests:  Recent Labs Lab 10/16/14 1425  AST 13  ALT 11  ALKPHOS 100  BILITOT 0.5  PROT 7.0  ALBUMIN 3.6   No results for input(s): LIPASE, AMYLASE in the last 168 hours. No results for input(s): AMMONIA in the last 168 hours.  CBC:  Recent Labs Lab 10/20/14 0430 10/20/14 1641 10/20/14 1645 10/21/14 0820 10/22/14 0305 10/23/14 0439  WBC 10.6*  --  11.1* 12.1* 9.4 9.0  HGB 8.1* 8.2* 8.2* 9.1* 8.0* 7.9*  HCT 23.9* 24.0* 24.4* 26.7* 23.2* 23.8*  MCV 85.1  --  85.3 84.8 84.1 82.6  PLT 105*  --  97* 108* 115* 153    Cardiac Enzymes: No results for input(s): CKTOTAL, CKMB, CKMBINDEX, TROPONINI in the last 168 hours.  BNP: BNP (last 3 results) No results for input(s): PROBNP in the last 8760 hours.   Other results:    Imaging: Dg Chest 2 View  10/22/2014   CLINICAL DATA:  Status post CABG now with significant chest soreness as well as weakness  EXAM: CHEST  2 VIEW  COMPARISON:  Portable chest x-ray of October 21, 2014  FINDINGS: The lungs are better inflated. The interstitial markings are slightly less conspicuous. There is persistent increased retrocardiac density on the left consistent with atelectasis or pneumonia. There are small bilateral pleural effusions. The cardiac silhouette remains enlarged. The pulmonary vascularity is more distinct today. There are 6 intact sternal wires present. The bony thorax unremarkable otherwise.  IMPRESSION: Left lower lobe atelectasis or pneumonia. There are small bilateral pleural effusions. Pulmonary interstitial edema has improved further since  yesterday's study.   Electronically Signed   By: David  Martinique   On: 10/22/2014 08:48   Dg Chest Port 1 View  10/23/2014   CLINICAL DATA:  68 year old male status post 3 vessel coronary artery bypass graft  EXAM: PORTABLE CHEST - 1 VIEW  COMPARISON:  Prior chest x-ray 10/22/2014  FINDINGS: Stable cardiomegaly. Patient is status post median sternotomy with evidence of multivessel CABG. Atherosclerotic calcifications present in the thoracic aorta. Persistent pulmonary vascular congestion with slightly decreased interstitial edema. Similar to slightly progressed left basilar atelectasis. No pneumothorax. No acute osseous abnormality.  IMPRESSION: 1. Similar to slightly progressed left basilar atelectasis versus infiltrate. Atelectasis is favored. 2. Persistent pulmonary vascular congestion with continued improvement in mild interstitial edema.   Electronically Signed   By: Jacqulynn Cadet M.D.   On: 10/23/2014 08:18     Medications:     Scheduled Medications: . acetaminophen  1,000 mg Oral 4 times per day  . amiodarone  200 mg Oral BID  . antiseptic oral rinse  7 mL Mouth Rinse BID  . aspirin EC  325 mg Oral Daily  . atorvastatin  40 mg Oral QHS  . bisacodyl  10 mg Oral Daily   Or  . bisacodyl  10 mg Rectal Daily  . budesonide-formoterol  2 puff Inhalation BID  . carvedilol  3.125 mg Oral BID WC  . docusate sodium  200 mg Oral Daily  . ferrous [id number] C-folic acid  1 capsule Oral TID PC  . [START ON 10/24/2014] furosemide  40 mg Oral Daily  . levalbuterol  1.25 mg Nebulization 4 times per day  . lisinopril  2.5 mg Oral Daily  . multivitamin with minerals  1 tablet Oral Daily  . pantoprazole  40 mg Oral Daily  . potassium chloride SA  20 mEq Oral Daily  . sodium chloride  3 mL Intravenous Q12H  . sodium chloride  3 mL Intravenous Q12H    Infusions: . 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 L  moderate 7) Mild COPD with severely reduced DLCO  Plan/Discussion:    Doing well. Weight up post-op. Continue diuresis. IV lasix given today. Check BMET in am.   Continue carvedilol and lisinopril. Will add low-dose spiro.   Length of Stay: 7  Daniel Bensimhon MD 10/23/2014, 1:10 PM  Advanced Heart Failure Team Pager [phone] (M-F; [place])  Please contact [facility] for night-coverage after hours (4p -7a ) and weekends on [email or web address]

## 2014-10-23 NOTE — Discharge Summary (Signed)
Physician Discharge Summary  Patient ID: Craig Page MRN: [id number] DOB/AGE: March 23, 1946 68 y.o.  Admit date: 10/16/2014 Discharge date: 10/24/2014  Admission Diagnoses:ischemic cardiomyopathy and recent presyncopal episode  Discharge Diagnoses:  Active Problems:   Coronary artery disease   Ischemic cardiomyopathy   CAD (coronary artery disease)   S/P CABG x 3  Patient Active Problem List   Diagnosis Date Noted  . S/P CABG x 3   . CAD (coronary artery disease) 10/19/2014  . Coronary artery disease 10/16/2014  . Ischemic cardiomyopathy 10/16/2014    History:  The patient is a 68 year old male with a long history of ischemic cardiomyopathy. He had a myocardial infarction in 1992 treated at that time with thrombolytics. He was admitted to [facility] hospital 3 weeks ago where he presented with shortness of breath. He was found on echocardiogram to have an ejection fraction of 25-30%. He was diuresed and discharged with plans for follow-up stress testing which revealed ischemia. He subsequently underwent cardiac CT scan with findings of significant coronary artery disease. Approximately one week later he had a syncopal episode with dizziness and left heart catheterization was done revealing severe three-vessel coronary artery disease. He was transferred to [facility] for further evaluation and treatment.   Home Medications Prior to Admission medications   Not on File    Past Medical History: Past Medical History  Diagnosis Date  . CAD (coronary artery disease)     3v  . Ischemic cardiomyopathy   . Chronic systolic heart failure   . HTN (hypertension)   . HLD (hyperlipidemia)     Past Surgical History: No past surgical history on file.  Family History: Family History  Problem Relation Age of Onset  . Heart failure Mother     deceased  . Cirrhosis Father     deceased    Social History: History   Social  History  . Marital Status: Married    Spouse Name: N/A    Number of Children: N/A  . Years of Education: N/A   Social History Main Topics  . Smoking status: Former Smoker -- 1.00 packs/day for 50 years    Types: Cigarettes  . Smokeless tobacco: Former Systems developer    Quit date: 09/24/2014  . Alcohol Use: No  . Drug Use: No  . Sexual Activity: Not on file   Other Topics Concern  . Not on file   Social History Narrative  . No narrative on file    Allergies:  Allergies not on file  Discharged Condition: good  Hospital Course: the patient was admitted and seen by cardiology as well as cardiothoracic surgery. Additionally, due to findings consistent with severe peripheral arterial disease and carotid artery disease he was also seen by Dr. Bridgett Larsson with vascular surgery. He was found to have total right internal carotid artery occlusion and low grade left internal carotid artery stenosis. This was not felt to require intervention at this time.he was also felt to require preoperative pulmonary function studies prior to proceeding with surgical revascularization.findings were consistent with severe reduction in diffusion capacity at 38% predicted. A CT scan of the chest was also obtained.this revealed findings consistent with mild emphysematous disease.the patient was ultimately deemed to be an acceptable candidate to proceed with CABG and this was scheduled. On 10/19/2014 he was taken to the operating room at which time he underwent the following procedure:   OPERATIVE REPORT   OPERATION: 1. Coronary artery bypass grafting x3 (left internal mammary artery to  left anterior  descending, saphenous vein graft to first diagonal,  saphenous vein graft to ramus intermediate. 2. Endoscopic harvest of left leg greater saphenous vein.  SURGEON: Ivin Poot, MD  ASSISTANT: Lars Pinks, PA  ANESTHESIA:  General.  PREOPERATIVE DIAGNOSIS: Ischemic cardiomyopathy with severe multivessel coronary artery disease, recent episode of syncope.  POSTOPERATIVE DIAGNOSIS: Ischemic cardiomyopathy with severe multivessel coronary artery disease, recent episode of syncope The patient tolerated the procedure well was taken to the surgical intensive care unit in stable condition  Postoperative hospital course: The patient is made good overall progress. He was weaned from the ventilator without significant difficulty. All routine lines, monitors and drainage devices have been discontinued in the standard fashion. He doesn't a mild acute blood loss anemia which is stable. Diabetes has been treated with routine protocols. He has volume overload but is responding to diuretics. Blood pressures are adequately controlled. Rhythm has been stable. Incisions are healing well without evidence of infection. He is tolerating gradually increasing activities using standard protocols. He has been seem by myself and heart failure team. He is felt surgically stable for discharge.  Consults: vascular surgery   Discharge Exam: Blood pressure 115/61, pulse 75, temperature 98.2 F (36.8 C), temperature source Oral, resp. rate 18, height 5\' 11"  (1.803 m), weight 180 lb 8.9 oz (81.9 kg), SpO2 96 %.  Physical Exam: Cardiovascular: RRR Pulmonary: Slightly diminished at bases; no rales, wheezes, or rhonchi. Abdomen: Soft, non tender, bowel sounds present. Extremities: Trace bilateral lower extremity edema. Wounds: Clean and dry. No erythema or signs of infection.  Disposition:     Medication List    STOP taking these medications        ENTRESTO 24-26 MG Tabs  Generic drug:  Sacubitril-Valsartan     potassium chloride SA 20 MEQ tablet  Commonly known as:  K-DUR,KLOR-CON      TAKE these medications        amiodarone 200 MG tablet  Commonly known as:  PACERONE  Take 1 tablet (200 mg total) by mouth daily.      aspirin 325 MG EC tablet  Take 1 tablet (325 mg total) by mouth daily.     atorvastatin 40 MG tablet  Commonly known as:  LIPITOR  Take 40 mg by mouth daily.     budesonide-formoterol 160-4.5 MCG/ACT inhaler  Commonly known as:  SYMBICORT  Inhale 2 puffs into the lungs 2 (two) times daily.     carvedilol 3.125 MG tablet  Commonly known as:  COREG  Take 1 tablet (3.125 mg total) by mouth 2 (two) times daily.     ferrous [id number] C-folic acid capsule  Commonly known as:  TRINSICON / FOLTRIN  Take 1 capsule by mouth daily. For one month then stop.     furosemide 20 MG tablet  Commonly known as:  LASIX  Take 1 tablet (20 mg total) by mouth daily.     lisinopril 2.5 MG tablet  Commonly known as:  PRINIVIL,ZESTRIL  Take 1 tablet (2.5 mg total) by mouth daily.     oxyCODONE 5 MG immediate release tablet  Commonly known as:  Oxy IR/ROXICODONE  Take 1-2 tablets (5-10 mg total) by mouth every 4 (four) hours as needed for severe pain.     spironolactone 12.5 mg Tabs tablet  Commonly known as:  ALDACTONE  Take 0.5 tablets (12.5 mg total) by mouth daily.       Follow-up Information    Follow up with VAN Wilber Oliphant, MD.   Specialty:  Cardiothoracic Surgery   Why:  11/21/2014 at 12:30 PM to see the surgeon. Please obtain a chest x-ray and [place] imaging at 11:30 AM. [facility] imaging is located in the same office complex.   Contact information:   [address] [place] [address] [phone]       Follow up with Dionisio David., MD.   Specialty:  Cardiology   Why:  please schedule an appointment to see cardiologist in 2 weeks.   Contact information:   [place] [address] [phone]       Follow up with [place] [facility] [facility] On 10/31/2014.   Specialty:  Cardiology   Why:  @ 9:15 am. [facility] is located in the hospital in the Heart and Vascular Building. Please bring all  of your medications.    Contact information:   [address] [phone] [place] [phone]    The patient has been discharged on:   1.Beta Blocker:  Yes Blue.Reese   ]                              No   [   ]                              If No, reason:  2.Ace Inhibitor/ARB: Yes [ y  ]                                     No  [    ]                                     If No, reason:  3.Statin:   Yes [ y  ]                  No  [   ]                  If No, reason:  4.Ecasa:  Yes  Blue.Reese   ]                  No   [   ]                  If No, reason:   Signed: Kathey Simer M PA-C 10/24/2014, 10:24 AM

## 2014-10-23 NOTE — Op Note (Signed)
[name], Page NO.:  [id number]  MEDICAL RECORD NO.:  [id number]  LOCATION:  [id number]                        FACILITY:  [facility]  PHYSICIAN:  Ivin Poot, M.D.  DATE OF BIRTH:  1946-11-19  DATE OF PROCEDURE:  10/22/2014 DATE OF DISCHARGE:                              OPERATIVE REPORT   OPERATION: 1. Coronary artery bypass grafting x3 (left internal mammary artery to     left anterior descending, saphenous vein graft to first diagonal,     saphenous vein graft to ramus intermediate. 2. Endoscopic harvest of left leg greater saphenous vein.  SURGEON:  Ivin Poot, MD  ASSISTANT:  Lars Pinks, PA  ANESTHESIA:  General.  PREOPERATIVE DIAGNOSIS:  Ischemic cardiomyopathy with severe multivessel coronary artery disease, recent episode of syncope.  POSTOPERATIVE DIAGNOSIS:  Ischemic cardiomyopathy with severe multivessel coronary artery disease, recent episode of syncope.  CLINICAL NOTE:  The patient is a 68 year old Caucasian male smoker with significant COPD and peripheral vascular disease who was transferred from an outside hospital after having cardiac catheterization, which demonstrated severe multivessel coronary artery disease including a chronic occluded right coronary, chronic occluded circumflex, and high- grade stenosis of the LAD diagonal system.  EF was 20-25%.  He had severe COPD, total right internal carotid occlusion without clinical stroke, and bilateral femoral artery disease with decreased ABIs and claudication of his right leg.  He was felt to be a candidate for high- risk multivessel CABG.  I discussed the procedure in detail with the patient and his family including the indications, benefits, alternatives, and risks.  I discussed the major aspects of the surgery with the patient including the location of the surgical incisions, the use of general anesthesia and cardiopulmonary bypass, and the expected postoperative hospital  recovery.  I discussed with the patient the risks to him of this operation including risks of stroke, MI, bleeding, infection, prolonged ventilator dependence, postoperative pleural effusions, arrhythmias, and death.  He demonstrated his understanding and agreed to proceed with surgery under what I felt was an informed consent.  OPERATIVE FINDINGS: 1. Adequate saphenous vein harvested from the left leg. 2. Adequate mammary artery as a conduit. 3. Heavily diseased and diffusely diseased coronary targets with right     coronary atretic and not graftable. 4. Improved global LV function following placement of bypass grafts     and separation from cardiopulmonary bypass.  DESCRIPTION OF PROCEDURE:  The patient was brought to the operating room and placed supine on the operating table where general anesthesia was induced under invasive hemodynamic monitoring.  The chest, abdomen, and legs were prepped with Betadine and draped as a sterile field.  A sternal incision was made as the saphenous vein was harvested endoscopically from the left leg.  The left internal mammary artery was harvested as a pedicle graft from its origin at the subclavian vessels. The sternal retractor was placed and the pericardium was opened and suspended.  The aorta was inspected, palpated, and examined. Pursestrings were placed in the ascending aorta and right atrium and heparin was administered.  When the heparin was found to be therapeutic by ACT measurement, the patient was cannulated and placed  on cardiopulmonary bypass.  The coronary arteries were identified for grafting, and the mammary artery and vein grafts were prepared for the distal anastomoses.  Cardioplegia cannulae were placed both antegrade and retrograde for cold blood cardioplegia and the patient was cooled to 32 degrees.  The aortic cross-clamp was applied.  One liter of cold blood cardioplegia was delivered in split doses between the  antegrade aortic and retrograde coronary sinus catheters.  There was good cardioplegic arrest and septal temperature dropped less than 12 degrees. Cardioplegia was delivered every 20 minutes or less while the crossclamp was in place.  The distal coronary anastomoses were then performed.  The first distal anastomosis was to the ramus intermediate branch of the left coronary. This is a 1.4-mm vessel with proximal 80% stenosis.  A reverse saphenous vein was sewn end-to-side with running 7-0 Prolene with good flow through the graft.  Cardioplegia was redosed.  The second distal anastomosis was to the first diagonal branch to LAD. This had ostial 80% stenosis.  A reverse saphenous vein was sewn end-to- side with running 7-0 Prolene with excellent flow and cardioplegia was redosed.  The third distal anastomosis was to the distal third of the LAD.  The left IMA pedicle was brought through an opening in the left lateral pericardium, was brought down onto the LAD and sewn end-to-side with running 8-0 Prolene.  There was excellent flow through the anastomosis after briefly releasing the pedicle bulldog on the mammary artery.  The bulldog was reapplied, and the pedicle was secured to the epicardium. Cardioplegia was redosed.  While the crossclamp was still in place, 2 proximal vein anastomoses were performed on the ascending aorta using a 4.0-mm punch running 6-0 Prolene.  Prior to tying down final proximal anastomosis, air was vented from the coronaries with a dose of retrograde warm blood cardioplegia.  The heart resumed a spontaneous rhythm after removal of the crossclamp. The vein grafts were de-aired, and the proximal and distal anastomoses were checked after the vascular bulldog was removed.  The patient was then rewarmed and reperfused.  Temporary pacing wires were applied.  The lungs were re-expanded carefully, and the ventilator was resumed.  The patient was then weaned successfully  off cardiopulmonary bypass with low- dose inotropes.  Echo showed improved global LV function without significant MR.  Protamine was administered without adverse reaction. The cannulae were removed.  The patient remained hemodynamically stable. The superior pericardial fat was closed over the aorta.  An anterior mediastinal and left pleural chest tube were placed and brought out through separate incisions.  The sternum was closed with wire.  The pectoralis fascia was closed with a running #1 Vicryl.  The subcutaneous and skin layers were closed with running Vicryl and sterile dressings were applied.  Total cardiopulmonary bypass time was 110 minutes.     Ivin Poot, M.D.     PV/MEDQ  D:  10/22/2014  T:  10/23/2014  Job:  [id number]  cc:   Neoma Laming, MD

## 2014-10-23 NOTE — Progress Notes (Signed)
Mid sternal incisional dressing removed. Incision painted with betadine. Edges well approximated. Pacing wires wrapped and taped. Pt NSR.

## 2014-10-23 NOTE — Progress Notes (Signed)
Utilization review completed.  

## 2014-10-24 DIAGNOSIS — I2584 Coronary atherosclerosis due to calcified coronary lesion: Secondary | ICD-10-CM

## 2014-10-24 LAB — BASIC METABOLIC PANEL
Anion gap: 12 (ref 5–15)
BUN: 15 mg/dL (ref 6–23)
CO2: 24 mEq/L (ref 19–32)
Calcium: 8.3 mg/dL — ABNORMAL LOW (ref 8.4–10.5)
Chloride: 102 mEq/L (ref 96–112)
Creatinine, Ser: 1.03 mg/dL (ref 0.50–1.35)
GFR calc Af Amer: 85 mL/min — ABNORMAL LOW (ref >90)
GFR, EST NON AFRICAN AMERICAN: 73 mL/min — AB (ref >90)
GLUCOSE: 98 mg/dL (ref 70–99)
Potassium: 3.7 mEq/L (ref 3.7–5.3)
Sodium: 138 mEq/L (ref 137–147)

## 2014-10-24 MED ORDER — OXYCODONE HCL 5 MG PO TABS: 5.0000 mg | ORAL_TABLET | ORAL | Status: DC | PRN

## 2014-10-24 MED ORDER — ASPIRIN 325 MG PO TBEC: 325.0000 mg | DELAYED_RELEASE_TABLET | Freq: Every day | ORAL | Status: DC

## 2014-10-24 MED ORDER — AMIODARONE HCL 200 MG PO TABS: 200.0000 mg | ORAL_TABLET | Freq: Every day | ORAL | Status: DC

## 2014-10-24 MED ORDER — FUROSEMIDE 20 MG PO TABS: 20.0000 mg | ORAL_TABLET | Freq: Every day | ORAL | Status: DC

## 2014-10-24 MED ORDER — SPIRONOLACTONE 25 MG PO TABS: 25.0000 mg | ORAL_TABLET | Freq: Every day | ORAL | Status: DC

## 2014-10-24 MED ORDER — FUROSEMIDE 20 MG PO TABS
20.0000 mg | ORAL_TABLET | Freq: Every day | ORAL | Status: DC
  Administered 2014-10-24: 20 mg via ORAL
  Filled 2014-10-24 (×2): qty 1

## 2014-10-24 MED ORDER — LISINOPRIL 2.5 MG PO TABS: 2.5000 mg | ORAL_TABLET | Freq: Every day | ORAL | Status: DC

## 2014-10-24 MED ORDER — BUDESONIDE-FORMOTEROL FUMARATE 160-4.5 MCG/ACT IN AERO: 2.0000 | INHALATION_SPRAY | Freq: Two times a day (BID) | RESPIRATORY_TRACT | Status: DC

## 2014-10-24 MED ORDER — FE FUMARATE-B12-VIT C-FA-IFC PO CAPS: 1.0000 | ORAL_CAPSULE | Freq: Every day | ORAL | Status: DC

## 2014-10-24 MED ORDER — SPIRONOLACTONE 12.5 MG HALF TABLET: 12.5000 mg | ORAL_TABLET | Freq: Every day | ORAL | Status: DC

## 2014-10-24 MED ORDER — CARVEDILOL 3.125 MG PO TABS: 3.1250 mg | ORAL_TABLET | Freq: Two times a day (BID) | ORAL | Status: DC

## 2014-10-24 MED ORDER — POTASSIUM CHLORIDE CRYS ER 20 MEQ PO TBCR
30.0000 meq | EXTENDED_RELEASE_TABLET | Freq: Once | ORAL | Status: AC
  Administered 2014-10-24: 30 meq via ORAL
  Filled 2014-10-24: qty 1

## 2014-10-24 MED ORDER — AMIODARONE HCL 200 MG PO TABS
200.0000 mg | ORAL_TABLET | Freq: Every day | ORAL | Status: DC
  Administered 2014-10-24: 200 mg via ORAL
  Filled 2014-10-24: qty 1

## 2014-10-24 NOTE — Plan of Care (Signed)
Problem: Phase I Progression Outcomes Goal: Initial discharge plan identified Outcome: Completed/Met Date Met:  10/24/14     

## 2014-10-24 NOTE — Plan of Care (Signed)
Problem: Phase I Progression Outcomes Goal: Voiding-avoid urinary catheter unless indicated Outcome: Completed/Met Date Met:  10/24/14     

## 2014-10-24 NOTE — Progress Notes (Signed)
Pt did not want treatment at this time. Pt BBS were clear. RA SPO2 97%. Pt in no distress. RT will continue to monitor.

## 2014-10-24 NOTE — Plan of Care (Signed)
Problem: Phase II Progression Outcomes Goal: Discharge plan established Outcome: Completed/Met Date Met:  10/24/14

## 2014-10-24 NOTE — Plan of Care (Signed)
Problem: Phase II Progression Outcomes Goal: Obtain order to discontinue catheter if appropriate Outcome: Not Applicable Date Met:  44/51/46

## 2014-10-24 NOTE — Progress Notes (Signed)
Chest tube sutures removed per protocol, edges intact benzoin and steristrips applied

## 2014-10-24 NOTE — Plan of Care (Signed)
Problem: Phase I Progression Outcomes Goal: Hemodynamically stable Outcome: Completed/Met Date Met:  10/24/14

## 2014-10-24 NOTE — Plan of Care (Signed)
Problem: Phase II Progression Outcomes Goal: IV changed to normal saline lock Outcome: Completed/Met Date Met:  10/24/14

## 2014-10-24 NOTE — Progress Notes (Signed)
Pt is being discharged home. Pt has been provided with discharge instructions. RN went over instructions with the patient and answered all questions he had. He is being transported home by his wife

## 2014-10-24 NOTE — Progress Notes (Signed)
Advanced Heart Failure Rounding Note   Subjective:     Doing well. Denies SOB, orthopnea or CP. Started on spironolactone yesterday and renal function stable.  Weight up about 6 lbs from baseline.   Echo: 30% RV normal RVSP 33  Carotids: R total occlusion L moderate disease ABIs: 0.5-0.6 range bilaterally PFTs: FEV1 3.5L, FVC 4.6L DLCO 39%  Chest CT: mild COPD ABG: 7.46/32/82/96% (RA)   Objective:   Weight Range:  Vital Signs:   Temp:  [97.5 F (36.4 C)-98.4 F (36.9 C)] 98.2 F (36.8 C) (11/18 0435) Pulse Rate:  [59-75] 75 (11/18 0957) Resp:  [18-20] 18 (11/18 0435) BP: (95-130)/(45-78) 115/61 mmHg (11/18 0957) SpO2:  [90 %-100 %] 96 % (11/18 0957) Weight:  [180 lb 8.9 oz (81.9 kg)] 180 lb 8.9 oz (81.9 kg) (11/18 0435) Last BM Date: 10/23/14  Weight change: Filed Weights   10/22/14 0643 10/23/14 0447 10/24/14 0435  Weight: 185 lb 3 oz (84 kg) 183 lb 10.3 oz (83.3 kg) 180 lb 8.9 oz (81.9 kg)    Intake/Output:   Intake/Output Summary (Last 24 hours) at 10/24/14 1004 Last data filed at 10/24/14 0437  Gross per 24 hour  Intake    440 ml  Output   2726 ml  Net  -2286 ml      Physical Exam: General: Well appearing. No resp difficulty, sitting up in chair HEENT: normal Neck: supple. JVP 8. Carotids 2+ bilat; + bilateral bruits. No lymphadenopathy or thryomegaly appreciated. Cor: Dressing in place. RRR 2/6 SEM RSB Lungs: clear long exp phase Abdomen: soft, nontender, non distended. Hypoactive  bowel sounds. Extremities: no cyanosis, clubbing, rash,  DPs nonpalpable bilaterally. No sores. 1+ bilateral edema, TED hose intact Neuro: alert & orientedx3, cranial nerves grossly intact. moves all 4 extremities w/o difficulty. Affect pleasant   Labs: Basic Metabolic Panel:  Recent Labs Lab 10/19/14 0414  10/19/14 1858  10/20/14 0430 10/20/14 1641 10/20/14 1645 10/21/14 0820 10/22/14 0305 10/24/14 0400  NA 143  < >  --   < > 141 136*  --  134* 134* 138  K 4.6   < >  --   < > 4.6 4.1  --  3.7 4.1 3.7  CL 106  < >  --   < > 106 104  --  96 97 102  CO2 23  --   --   --  22  --   --  22 22 24   GLUCOSE 85  < >  --   < > 119* 112*  --  91 67* 98  BUN 16  < >  --   < > 13 12  --  16 19 15   CREATININE 1.05  < > 0.99  < > 1.00 0.90 0.92 0.90 0.93 1.03  CALCIUM 8.7  --   --   --  8.6  --   --  8.7 8.3* 8.3*  MG  --   --  3.1*  --  2.7*  --  2.4  --   --   --   < > = values in this interval not displayed.  Liver Function Tests: No results for input(s): AST, ALT, ALKPHOS, BILITOT, PROT, ALBUMIN in the last 168 hours. No results for input(s): LIPASE, AMYLASE in the last 168 hours. No results for input(s): AMMONIA in the last 168 hours.  CBC:  Recent Labs Lab 10/20/14 0430 10/20/14 1641 10/20/14 1645 10/21/14 0820 10/22/14 0305 10/23/14 0439  WBC 10.6*  --  11.1*  12.1* 9.4 9.0  HGB 8.1* 8.2* 8.2* 9.1* 8.0* 7.9*  HCT 23.9* 24.0* 24.4* 26.7* 23.2* 23.8*  MCV 85.1  --  85.3 84.8 84.1 82.6  PLT 105*  --  97* 108* 115* 153    Cardiac Enzymes: No results for input(s): CKTOTAL, CKMB, CKMBINDEX, TROPONINI in the last 168 hours.  BNP: BNP (last 3 results) No results for input(s): PROBNP in the last 8760 hours.   Other results:    Imaging: Dg Chest Port 1 View  10/23/2014   CLINICAL DATA:  68 year old male status post 3 vessel coronary artery bypass graft  EXAM: PORTABLE CHEST - 1 VIEW  COMPARISON:  Prior chest x-ray 10/22/2014  FINDINGS: Stable cardiomegaly. Patient is status post median sternotomy with evidence of multivessel CABG. Atherosclerotic calcifications present in the thoracic aorta. Persistent pulmonary vascular congestion with slightly decreased interstitial edema. Similar to slightly progressed left basilar atelectasis. No pneumothorax. No acute osseous abnormality.  IMPRESSION: 1. Similar to slightly progressed left basilar atelectasis versus infiltrate. Atelectasis is favored. 2. Persistent pulmonary vascular congestion with  continued improvement in mild interstitial edema.   Electronically Signed   By: Jacqulynn Cadet M.D.   On: 10/23/2014 08:18     Medications:     Scheduled Medications: . acetaminophen  1,000 mg Oral 4 times per day  . amiodarone  200 mg Oral Daily  . antiseptic oral rinse  7 mL Mouth Rinse BID  . aspirin EC  325 mg Oral Daily  . atorvastatin  40 mg Oral QHS  . bisacodyl  10 mg Oral Daily   Or  . bisacodyl  10 mg Rectal Daily  . budesonide-formoterol  2 puff Inhalation BID  . carvedilol  3.125 mg Oral BID WC  . docusate sodium  200 mg Oral Daily  . ferrous [id number] C-folic acid  1 capsule Oral TID PC  . furosemide  20 mg Oral Daily  . levalbuterol  1.25 mg Nebulization 4 times per day  . lisinopril  2.5 mg Oral Daily  . multivitamin with minerals  1 tablet Oral Daily  . pantoprazole  40 mg Oral Daily  . potassium chloride  30 mEq Oral Once  . sodium chloride  3 mL Intravenous Q12H  . sodium chloride  3 mL Intravenous Q12H  . spironolactone  12.5 mg Oral Daily    Infusions: . sodium chloride      PRN Medications: sodium chloride, metoCLOPramide (REGLAN) injection, metoprolol, ondansetron (ZOFRAN) IV, oxyCODONE, sodium chloride, sodium chloride, traMADol   Assessment:   1) 3V CAD  - s/p CABG 11/13 2) Chronic systolic HF - EF 68-37% 3) Tobacco abuse - quit 4) HTN 5) PAD with intermittent claudication    -small AAA on u/s 6) Carotid artery stenosis    - R 100%. L moderate 7) Mild COPD with severely reduced DLCO  Plan/Discussion:    Doing well and denies SOB. Volume status mildly elevated and weight up 6 lbs from pre-op. Will start lasix 20 mg daily and continue spironolactone 12.5 mg daily. Renal function stable and will see in the HF clinic next week and if doing well transition back to primary cardiologist (Dr. Rockey Situ).   Length of Stay: 8  [name] NP-C 10/24/2014, 10:04 AM  Advanced Heart Failure Team Pager [phone] (M-F; 7a - 4p)   Please contact [facility] for night-coverage after hours (4p -7a ) and weekends on [email or web address]  Patient seen and examined with Junie Bame, NP. We discussed all aspects of the encounter.  I agree with the assessment and plan as stated above.   Looks good. Volume status improving. D/c meds reviewed with TCTS. We will see him in HF Clinic next week and then have him f/u with Dr. Rockey Situ and Dr. Fletcher Anon (PAD). Check echo 4-6 weeks to look for improvement in LV function after revascularization.   Terryl Niziolek,MD 11:06 AM

## 2014-10-24 NOTE — Progress Notes (Signed)
1330-1400 Education completed with pt and wife who voiced understanding. Encouraged IS and walking. Discussed CRP 2 but pt not interested. Discussed smoking cessation and gave handout. Pt stated he has not smoked since 10/19 and will not be problem. Reviewed CHF zones of when to call MD with daily weights. Gave low sodium handouts and encouraged 2000 mg or less.  Graylon Good RN BSN 10/24/2014 2:00 PM

## 2014-10-24 NOTE — Plan of Care (Signed)
Problem: Phase I Progression Outcomes Goal: Other Phase I Outcomes/Goals Outcome: Not Applicable Date Met:  57/49/35

## 2014-10-24 NOTE — Progress Notes (Addendum)
      [address]       ,[place] [address]             [phone]        5 Days Post-Op Procedure(s) (LRB): CORONARY ARTERY BYPASS GRAFTING (CABG) (N/A) INTRAOPERATIVE TRANSESOPHAGEAL ECHOCARDIOGRAM (N/A) ARTERIAL LINE INSERTION (Left)  Subjective: Patient about to eat breakfast. He has no complaints.  Objective: Vital signs in last 24 hours: Temp:  [97.5 F (36.4 C)-98.4 F (36.9 C)] 98.2 F (36.8 C) (11/18 0435) Pulse Rate:  [59-69] 67 (11/18 0715) Cardiac Rhythm:  [-] Normal sinus rhythm (11/18 0804) Resp:  [18-20] 18 (11/18 0435) BP: (95-130)/(45-78) 119/78 mmHg (11/18 0715) SpO2:  [90 %-100 %] 94 % (11/18 0435) Weight:  [180 lb 8.9 oz (81.9 kg)] 180 lb 8.9 oz (81.9 kg) (11/18 0435)  Pre op weight 79 kg Current Weight  10/24/14 180 lb 8.9 oz (81.9 kg)       Intake/Output from previous day: 11/17 0701 - 11/18 0700 In: 38 [P.O.:680] Out: 2726 [Urine:2725; Stool:1]   Physical Exam:  Cardiovascular: RRR Pulmonary: Slightly diminished at bases; no rales, wheezes, or rhonchi. Abdomen: Soft, non tender, bowel sounds present. Extremities: Trace bilateral lower extremity edema. Wounds: Clean and dry.  No erythema or signs of infection.  Lab Results: CBC:  Recent Labs  10/22/14 0305 10/23/14 0439  WBC 9.4 9.0  HGB 8.0* 7.9*  HCT 23.2* 23.8*  PLT 115* 153   BMET:   Recent Labs  10/22/14 0305 10/24/14 0400  NA 134* 138  K 4.1 3.7  CL 97 102  CO2 22 24  GLUCOSE 67* 98  BUN 19 15  CREATININE 0.93 1.03  CALCIUM 8.3* 8.3*    PT/INR:  Lab Results  Component Value Date   INR 1.52* 10/19/2014   INR 1.09 10/16/2014   ABG:  INR: Will add last result for INR, ABG once components are confirmed Will add last 4 CBG results once components are confirmed  Assessment/Plan:  1. CV - Previous a fib. Maintaining SR in the 60's. On Amiodarone 200 bid, Coreg 3.125 bid, Spirinolactone 12.5 daily,and Lisinopril 2.5 daily. Will decrease  Amiodarone to daily.. 2.  Pulmonary - Mild COPD.Continue Symbicort and Xopenex.  Encourage incentive spirometer 3. Volume Overload - On Lasix 40 daily. Will stop as now on Spirinolactone 4.  Acute blood loss anemia - H and H 7.9 and 23.8 this am.  Continue Trinsicon 5. Supplement potassium 6. Discharge     Yichen Gilardi MPA-C 10/24/2014,8:44 AM

## 2014-10-31 ENCOUNTER — Encounter (HOSPITAL_COMMUNITY): Payer: Self-pay

## 2014-10-31 ENCOUNTER — Ambulatory Visit (HOSPITAL_COMMUNITY)
Admit: 2014-10-31 | Discharge: 2014-10-31 | Disposition: A | Discharge status: Home / Self Care | Payer: Commercial Managed Care - HMO | Source: Ambulatory Visit | Attending: Cardiology | Admitting: Cardiology

## 2014-10-31 VITALS — BP 124/70 | HR 66 | Resp 18 | Wt 173.2 lb

## 2014-10-31 DIAGNOSIS — Z951 Presence of aortocoronary bypass graft: Secondary | ICD-10-CM | POA: Insufficient documentation

## 2014-10-31 DIAGNOSIS — I5022 Chronic systolic (congestive) heart failure: Secondary | ICD-10-CM | POA: Diagnosis not present

## 2014-10-31 DIAGNOSIS — I10 Essential (primary) hypertension: Secondary | ICD-10-CM | POA: Insufficient documentation

## 2014-10-31 DIAGNOSIS — I6529 Occlusion and stenosis of unspecified carotid artery: Secondary | ICD-10-CM | POA: Insufficient documentation

## 2014-10-31 DIAGNOSIS — I255 Ischemic cardiomyopathy: Secondary | ICD-10-CM

## 2014-10-31 DIAGNOSIS — I2510 Atherosclerotic heart disease of native coronary artery without angina pectoris: Secondary | ICD-10-CM | POA: Diagnosis not present

## 2014-10-31 DIAGNOSIS — I739 Peripheral vascular disease, unspecified: Secondary | ICD-10-CM | POA: Insufficient documentation

## 2014-10-31 LAB — BASIC METABOLIC PANEL
Anion gap: 15 (ref 5–15)
BUN: 21 mg/dL (ref 6–23)
CO2: 22 meq/L (ref 19–32)
Calcium: 9.6 mg/dL (ref 8.4–10.5)
Chloride: 98 mEq/L (ref 96–112)
Creatinine, Ser: 1.15 mg/dL (ref 0.50–1.35)
GFR calc Af Amer: 74 mL/min — ABNORMAL LOW (ref >90)
GFR, EST NON AFRICAN AMERICAN: 64 mL/min — AB (ref >90)
Glucose, Bld: 69 mg/dL — ABNORMAL LOW (ref 70–99)
POTASSIUM: 5 meq/L (ref 3.7–5.3)
SODIUM: 135 meq/L — AB (ref 137–147)

## 2014-10-31 MED ORDER — ATORVASTATIN CALCIUM 40 MG PO TABS: 40.0000 mg | ORAL_TABLET | Freq: Every day | ORAL | Status: DC

## 2014-10-31 MED ORDER — LISINOPRIL 2.5 MG PO TABS: 2.5000 mg | ORAL_TABLET | Freq: Two times a day (BID) | ORAL | Status: DC

## 2014-10-31 MED ORDER — ASPIRIN EC 81 MG PO TBEC: 81.0000 mg | DELAYED_RELEASE_TABLET | Freq: Every day | ORAL | Status: DC

## 2014-10-31 MED ORDER — CARVEDILOL 3.125 MG PO TABS: 3.1250 mg | ORAL_TABLET | Freq: Two times a day (BID) | ORAL | Status: DC

## 2014-10-31 MED ORDER — SPIRONOLACTONE 25 MG PO TABS: 12.5000 mg | ORAL_TABLET | Freq: Every day | ORAL | Status: AC

## 2014-10-31 NOTE — Progress Notes (Signed)
Patient ID: Craig Page, male   DOB: 1946-04-15, 68 y.o.   MRN: [id number] Primary Physician: Dr. Luan Pulling Primary Cardiologist: Dr. Humphrey Rolls   HPI:  Craig Page is a 68 yo male with a history of ICM (Jeddito treated with thrombolytics), HTN, HLD, PAD, 3V CAD s/p CABG x3 (LIMA-LAD, SVG to diagonal and SVG to OM, 2015) and chronic systolic HF.   [facility] Follow up for Heart Failure: Doing well. Denies SOB, PND, orthopnea or CP. Taking all medications as prescribed. Had dizziness when first discharged but no issues now. Weight at home 166-170 lbs. Following a low salt diet and drinking less than 2L a day. No longer smoking.   Studies: [facility] (09/2014): EF 25%, R dominant, Lmain: 90%, LCx: 90% prox and 90% mid, RCA: completely occluded. TE Echo 10/19/14: EF 30-35%, no evidence of thrombus Carotid Dopplers (10/2014): R ICA non identifiable, appears to be ECA and branch which both respond to temporal tap, L ICA: 40-59% stenosis ABIs (10/2014): Right moderate/severe reduction and L: moderate reduction Chest CT (10/2014): mild COPD  ROS: All systems negative except as listed in HPI, PMH and Problem List.  SH:  History   Social History  . Marital Status: Married    Spouse Name: N/A    Number of Children: N/A  . Years of Education: N/A   Occupational History  . Not on file.   Social History Main Topics  . Smoking status: Former Smoker -- 1.00 packs/day for 50 years    Types: Cigarettes    Quit date: 09/24/2014  . Smokeless tobacco: Never Used  . Alcohol Use: No  . Drug Use: No  . Sexual Activity: Not Currently   Other Topics Concern  . Not on file   Social History Narrative    FH:  Family History  Problem Relation Age of Onset  . Heart failure Mother     deceased  . Cirrhosis Father     deceased    Past Medical History  Diagnosis Date  . CAD (coronary artery disease)     3v  . Ischemic cardiomyopathy   . Chronic systolic heart failure   . HTN (hypertension)   .  HLD (hyperlipidemia)   . Follicular lymphoma   . Myocardial infarction 1992    treated with thrombolytics/notes 10/16/2014    Current Outpatient Prescriptions  Medication Sig Dispense Refill  . amiodarone (PACERONE) 200 MG tablet Take 1 tablet (200 mg total) by mouth daily. 30 tablet 1  . aspirin EC 325 MG EC tablet Take 1 tablet (325 mg total) by mouth daily. 30 tablet 0  . atorvastatin (LIPITOR) 40 MG tablet Take 40 mg by mouth daily.  0  . budesonide-formoterol (SYMBICORT) 160-4.5 MCG/ACT inhaler Inhale 2 puffs into the lungs 2 (two) times daily. 1 Inhaler 0  . carvedilol (COREG) 3.125 MG tablet Take 1 tablet (3.125 mg total) by mouth 2 (two) times daily. 60 tablet 1  . ferrous [id number] C-folic acid (TRINSICON / FOLTRIN) capsule Take 1 capsule by mouth daily. For one month then stop. 30 capsule 0  . furosemide (LASIX) 20 MG tablet Take 1 tablet (20 mg total) by mouth daily. 30 tablet 3  . lisinopril (PRINIVIL,ZESTRIL) 2.5 MG tablet Take 1 tablet (2.5 mg total) by mouth daily. 30 tablet 1  . oxyCODONE (OXY IR/ROXICODONE) 5 MG immediate release tablet Take 1-2 tablets (5-10 mg total) by mouth every 4 (four) hours as needed for severe pain. 30 tablet 0  . spironolactone (ALDACTONE) 12.5  mg TABS tablet Take 0.5 tablets (12.5 mg total) by mouth daily. 30 tablet 1   No current facility-administered medications for this encounter.    Filed Vitals:   10/31/14 0907  BP: 124/70  Pulse: 66  Resp: 18  Weight: 173 lb 4 oz (78.586 kg)  SpO2: 97%    PHYSICAL EXAM: General:  Well appearing. No resp difficulty, wife present HEENT: normal Neck: supple. JVP flat. Carotids 2+ bilaterally; no bruits. No lymphadenopathy or thryomegaly appreciated. Cor: PMI normal. Regular rate & rhythm. No rubs, gallops or murmurs, MSI approximated and no drainage. Lungs: clear Abdomen: soft, nontender, nondistended. No hepatosplenomegaly. No bruits or masses. Good bowel sounds. Extremities: no  cyanosis, clubbing, rash, edema, TED hose intact Neuro: alert & orientedx3, cranial nerves grossly intact. Moves all 4 extremities w/o difficulty. Affect pleasant.  ASSESSMENT & PLAN:  1) Chronic systolic HF: ICM, EF 91-79% (10/2014) - Reviewed discharge summary and patient recently discharged from the hospital for CABG x 3. Discharge weight 180 lbs. - NYHA II symptoms and volume status stable. Weight has been trending down at home. Will continue lasix at current dose and if continues to trend down may need to cut back to QOD. - Continue coreg 3.125 mg BID and spironolactone 12.5 mg daily - Will increase lisinopril to 2.5 mg BID. Check BMET today and in 7-10 days.  - Discussed Entresto and the data. Patient reports he was previously on medication and I highly recommended going back on medication but since he will not be following with our team and will follow with Dr. Humphrey Rolls did not want to start new medication that he may not be approved for. Told to discuss with primary cardiologist and if he does go back to taking that medication that he would need to stop lisinopril for at least 36 hrs.  - Will need repeat ECHO in about 1 month to reassess EF. If EF remains less than 35% will need referral to EP for ICD. He reports he has LifeVest at home from previous stay and discussed the safest option is to continue to wear LifeVest 24 hrs a day until repeat Echo.  - Reinforced the need and importance of daily weights, a low sodium diet, and fluid restriction (less than 2 L a day). Instructed to call the HF clinic if weight increases more than 3 lbs overnight or 5 lbs in a week.  2) CAD  - s/p CABG x 3 - Continue statin, BB and ACE-I. Will decrease ASA to 81 mg daily. - no s/s of ischemia. 3) HTN - Stable. Continue current medications. As above increase lisinopril to 2.5 mg BID for afterload reduction. 4) PAD  - intermittent claudication. Continue to follow 5) Carotid artery stenosis - R 100% and L  moderate. Will need to follow up with Dr. Bridgett Larsson in 6 months.  Will transition back to Dr. Humphrey Rolls and told patient if he has any issues he can always come back to our clinic.   Junie Bame B NP-C 4:35 PM

## 2014-10-31 NOTE — Patient Instructions (Signed)
Doing great.  Increase your lisinopril to 2.5 mg (1 tablet) twice a day. Call if you have any dizziness.  Get labs checked in 7-10 days at Dr. Laurelyn Sickle office. Please have them fax results to [phone]  Follow up as needed.  Change aspirin to 81 mg daily.  Do the following things EVERYDAY: 1) Weigh yourself in the morning before breakfast. Write it down and keep it in a log. 2) Take your medicines as prescribed 3) Eat low salt foods-Limit salt (sodium) to 2000 mg per day.  4) Stay as active as you can everyday 5) Limit all fluids for the day to less than 2 liters 6)

## 2014-11-13 ENCOUNTER — Telehealth (HOSPITAL_COMMUNITY): Payer: Self-pay | Admitting: Vascular Surgery

## 2014-11-13 DIAGNOSIS — I5022 Chronic systolic (congestive) heart failure: Secondary | ICD-10-CM

## 2014-11-13 MED ORDER — LISINOPRIL 2.5 MG PO TABS: 2.5000 mg | ORAL_TABLET | Freq: Two times a day (BID) | ORAL | Status: DC

## 2014-11-13 NOTE — Telephone Encounter (Signed)
As requested new rx sent into pharmacy to reflect med changes

## 2014-11-13 NOTE — Telephone Encounter (Signed)
Pt wife called ... When pt was here his lisinopril was increased.Marland Kitchen He is now out of the medication and he needs a new prescription with dosage changes.Marland Kitchen

## 2014-11-20 ENCOUNTER — Other Ambulatory Visit: Payer: Self-pay | Admitting: Cardiothoracic Surgery

## 2014-11-20 DIAGNOSIS — I2510 Atherosclerotic heart disease of native coronary artery without angina pectoris: Secondary | ICD-10-CM

## 2014-11-21 ENCOUNTER — Ambulatory Visit: Payer: Medicare HMO | Admitting: Cardiothoracic Surgery

## 2014-11-21 ENCOUNTER — Other Ambulatory Visit: Payer: Self-pay | Admitting: *Deleted

## 2014-11-21 DIAGNOSIS — I6523 Occlusion and stenosis of bilateral carotid arteries: Secondary | ICD-10-CM

## 2014-11-26 ENCOUNTER — Ambulatory Visit: Payer: Medicare HMO

## 2014-11-28 ENCOUNTER — Other Ambulatory Visit: Payer: Self-pay | Admitting: Thoracic Surgery (Cardiothoracic Vascular Surgery)

## 2014-11-28 DIAGNOSIS — Z951 Presence of aortocoronary bypass graft: Secondary | ICD-10-CM

## 2014-12-01 ENCOUNTER — Other Ambulatory Visit: Payer: Self-pay | Admitting: Physician Assistant

## 2014-12-03 ENCOUNTER — Ambulatory Visit (INDEPENDENT_AMBULATORY_CARE_PROVIDER_SITE_OTHER): Payer: Self-pay | Admitting: Physician Assistant

## 2014-12-03 ENCOUNTER — Ambulatory Visit
Admission: RE | Admit: 2014-12-03 | Discharge: 2014-12-03 | Disposition: A | Discharge status: Home / Self Care | Payer: Commercial Managed Care - HMO | Source: Ambulatory Visit | Attending: Thoracic Surgery (Cardiothoracic Vascular Surgery) | Admitting: Thoracic Surgery (Cardiothoracic Vascular Surgery)

## 2014-12-03 VITALS — BP 132/81 | HR 59 | Resp 19 | Ht 71.0 in | Wt 170.0 lb

## 2014-12-03 DIAGNOSIS — Z951 Presence of aortocoronary bypass graft: Secondary | ICD-10-CM

## 2014-12-03 DIAGNOSIS — I2510 Atherosclerotic heart disease of native coronary artery without angina pectoris: Secondary | ICD-10-CM

## 2014-12-03 MED ORDER — ATORVASTATIN CALCIUM 40 MG PO TABS: 40.0000 mg | ORAL_TABLET | Freq: Every day | ORAL | Status: DC

## 2014-12-03 MED ORDER — AMIODARONE HCL 200 MG PO TABS: 200.0000 mg | ORAL_TABLET | Freq: Every day | ORAL | Status: AC

## 2014-12-03 NOTE — Progress Notes (Signed)
[address]       [place] [address]             [phone]          HPI: Patient returns for routine postoperative follow-up having undergone CABG x 3 by Dr. Prescott Gum on 10/19/2014.  The patient's postoperative course was notable for atrial fibrillation which converted to sinus rhythm on Amiodarone.  During his admission, he was followed by the Advanced Heart Failure Team for his ischemic cardiomyopathy and chronic systolic heart failure and was treated with aggressive diuresis. He otherwise did well postoperatively and was discharged home in good condition on 10/24/2014.  Since hospital discharge, the patient has had no problems.  He has not taken any narcotic pain medication.  He is ambulating without difficulty.  He has quit smoking. His appetite is good.  He denies chest pain, shortness of breath or lower extremity edema.  He was seen in the AHF clinic on 11/25 by Junie Bame, NP, and at that time his lisinopril was increased.  She discussed repeating an echocardiogram in 1 month, and he plans to follow up with Dr. Humphrey Rolls, his primary cardiologist for this.  He has an appointment with Dr. Humphrey Rolls next Monday.     Current Outpatient Prescriptions  Medication Sig Dispense Refill  . amiodarone (PACERONE) 200 MG tablet Take 1 tablet (200 mg total) by mouth daily. 30 tablet 1  . aspirin EC 81 MG tablet Take 1 tablet (81 mg total) by mouth daily. 30 tablet 3  . atorvastatin (LIPITOR) 40 MG tablet Take 1 tablet (40 mg total) by mouth daily. 30 tablet 1  . budesonide-formoterol (SYMBICORT) 160-4.5 MCG/ACT inhaler Inhale 2 puffs into the lungs 2 (two) times daily. 1 Inhaler 0  . carvedilol (COREG) 3.125 MG tablet Take 1 tablet (3.125 mg total) by mouth 2 (two) times daily. 60 tablet 6  . ferrous [id number] C-folic acid (TRINSICON / FOLTRIN) capsule Take 1 capsule by mouth daily. For one month then stop. 30 capsule 0  . furosemide (LASIX) 20 MG tablet Take 1  tablet (20 mg total) by mouth daily. 30 tablet 3  . lisinopril (PRINIVIL,ZESTRIL) 2.5 MG tablet Take 1 tablet (2.5 mg total) by mouth 2 (two) times daily. 60 tablet 6  . oxyCODONE (OXY IR/ROXICODONE) 5 MG immediate release tablet Take 1-2 tablets (5-10 mg total) by mouth every 4 (four) hours as needed for severe pain. 30 tablet 0  . spironolactone (ALDACTONE) 25 MG tablet Take 0.5 tablets (12.5 mg total) by mouth daily. 45 tablet 3   No current facility-administered medications for this visit.     Physical Exam: BP 132/81 HR 59 Resp 19 Wounds: Incisions well healed. Heart: regular rate and rhythm Lungs: Clear Extremities: Trace LLE edema   Diagnostic Tests: Chest xray: Dg Chest 2 View  12/03/2014   CLINICAL DATA:  Status post 3 vessel CABG 10/19/2014  EXAM: CHEST  2 VIEW  COMPARISON:  Most recent exam 10/23/2014  FINDINGS: Patient is post CABG. Decrease cardiomegaly from prior, the heart is at the upper limits of normal in size. Resolved retrocardiac opacity from prior. Mild interstitial prominence persists. No confluent airspace disease. No pleural effusion. Degenerative change seen throughout the spine.  IMPRESSION: Post CABG with decreased cardiomegaly. Resolved retrocardiac atelectasis.   Electronically Signed   By: Jeb Levering M.D.   On: 12/03/2014 14:30       Assessment/Plan: The patient has progressed well status post CABG.  Incisions are well healed and he has begun driving.  He plans to start Cardiac Rehab in the next 2 weeks. He remains in sinus rhythm and blood pressures are stable. I did refill his Lipitor and Amiodarone until he gets in to see the cardiologist.  Hopefully, the Amiodarone will be discontinued at that time.   He is scheduled for cardiology follow up with Dr. Humphrey Rolls in 1 week.  We will plan to see him back in 1 month with Dr. Prescott Gum, and he will call if he has any issues in the interim.

## 2014-12-25 ENCOUNTER — Encounter: Payer: Self-pay | Admitting: Cardiovascular Disease

## 2015-01-02 ENCOUNTER — Encounter: Payer: Self-pay | Admitting: Cardiothoracic Surgery

## 2015-01-02 ENCOUNTER — Ambulatory Visit (INDEPENDENT_AMBULATORY_CARE_PROVIDER_SITE_OTHER): Payer: Self-pay | Admitting: Cardiothoracic Surgery

## 2015-01-02 VITALS — Ht 71.0 in

## 2015-01-02 DIAGNOSIS — Z951 Presence of aortocoronary bypass graft: Secondary | ICD-10-CM

## 2015-01-02 DIAGNOSIS — I2510 Atherosclerotic heart disease of native coronary artery without angina pectoris: Secondary | ICD-10-CM

## 2015-01-02 NOTE — Progress Notes (Signed)
PCP is Philbert Riser,  Nikki Dom, MD Referring Provider is Dionisio David, MD  Chief Complaint  Patient presents with  . Routine Post Op    f/u from surgery  s/p Coronary artery bypass grafting x3 10/22/14    HPI: 69 year old male returns for his final postop visit after multivessel CABG. He was transferred from [facility] by Dr. Humphrey Rolls after cardiac catheterization showed severe multivessel CAD with poor LV function-EF 25%. The patient had postoperative atrial fibrillation which converted to sinus rhythm with amiodarone. He is still taking amiodarone 200 mg daily. He is planning on starting cardiac rehabilitation at [facility] The patient denies recurrent angina, symptoms of CHF or problems with his incisions.  The patient was seen in [facility]up [facility], was found to be doing well, and then transition to his cardiologist Dr. Yancey Flemings in [place]. Past Medical History  Diagnosis Date  . CAD (coronary artery disease)     3v  . Ischemic cardiomyopathy   . Chronic systolic heart failure   . HTN (hypertension)   . HLD (hyperlipidemia)   . Follicular lymphoma   . Myocardial infarction 1992    treated with thrombolytics/notes 10/16/2014    Past Surgical History  Procedure Laterality Date  . Hernia repair    . Umbilical hernia repair  1964  . Coronary artery bypass graft N/A 10/19/2014    Procedure: CORONARY ARTERY BYPASS GRAFTING (CABG);  Surgeon: Ivin Poot, MD;  Location: [facility];  Service: Open Heart Surgery;  Laterality: N/A;  Times 3 using left internal mammary artery and endoscopically harvested left saphenous vein  . Intraoperative transesophageal echocardiogram N/A 10/19/2014    Procedure: INTRAOPERATIVE TRANSESOPHAGEAL ECHOCARDIOGRAM;  Surgeon: Ivin Poot, MD;  Location: [place];  Service: Open Heart Surgery;  Laterality: N/A;    Family History  Problem Relation Age of Onset  . Heart failure Mother     deceased  . Cirrhosis  Father     deceased    Social History History  Substance Use Topics  . Smoking status: Former Smoker -- 1.00 packs/day for 50 years    Types: Cigarettes    Quit date: 09/24/2014  . Smokeless tobacco: Never Used  . Alcohol Use: No    Current Outpatient Prescriptions  Medication Sig Dispense Refill  . amiodarone (PACERONE) 200 MG tablet Take 1 tablet (200 mg total) by mouth daily. 30 tablet 1  . aspirin EC 81 MG tablet Take 1 tablet (81 mg total) by mouth daily. (Patient taking differently: Take 325 mg by mouth daily. ) 30 tablet 3  . atorvastatin (LIPITOR) 40 MG tablet Take 1 tablet (40 mg total) by mouth daily. 30 tablet 1  . carvedilol (COREG) 3.125 MG tablet Take 1 tablet (3.125 mg total) by mouth 2 (two) times daily. 60 tablet 6  . furosemide (LASIX) 20 MG tablet Take 1 tablet (20 mg total) by mouth daily. (Patient taking differently: Take 20 mg by mouth daily. PRN) 30 tablet 3  . lisinopril (PRINIVIL,ZESTRIL) 2.5 MG tablet Take 1 tablet (2.5 mg total) by mouth 2 (two) times daily. 60 tablet 6  . spironolactone (ALDACTONE) 25 MG tablet Take 0.5 tablets (12.5 mg total) by mouth daily. 45 tablet 3   No current facility-administered medications for this visit.    No Known Allergies  Review of Systems  Improved appetite No shortness of breath No fever Surgical incisions healing well without drainage  walking20 minutes daily Not smoking  Ht 5'  11" (1.803 m)  SpO2  Physical Exam Alert and comfortable Lungs clear Sternal incision well-healed Heart rhythm regular murmur Extremities without edema  neuro intact  Diagnostic Tests: Last chest x-ray performed late December personally reviewed showing clear lung fields no pleural effusion  Impression: Stop amiodarone Complete outpatient cardiac rehabilitation Avoid lifting more than 20 pounds until 3 months postop Return as needed  Plan:

## 2015-01-07 ENCOUNTER — Encounter: Payer: Self-pay | Admitting: Cardiovascular Disease

## 2015-02-05 ENCOUNTER — Encounter: Payer: Self-pay | Admitting: Cardiovascular Disease

## 2015-03-26 IMAGING — CR DG CHEST 1V PORT
1 series · 1 of 1 positions shown · non-contrast
Comparison: 10/20/2014, 10/19/2014, and CT chest 10/17/2014

CLINICAL DATA: Atelectasis.  History of CABG 10/19/14

EXAM:
PORTABLE CHEST - 1 VIEW

[AP]
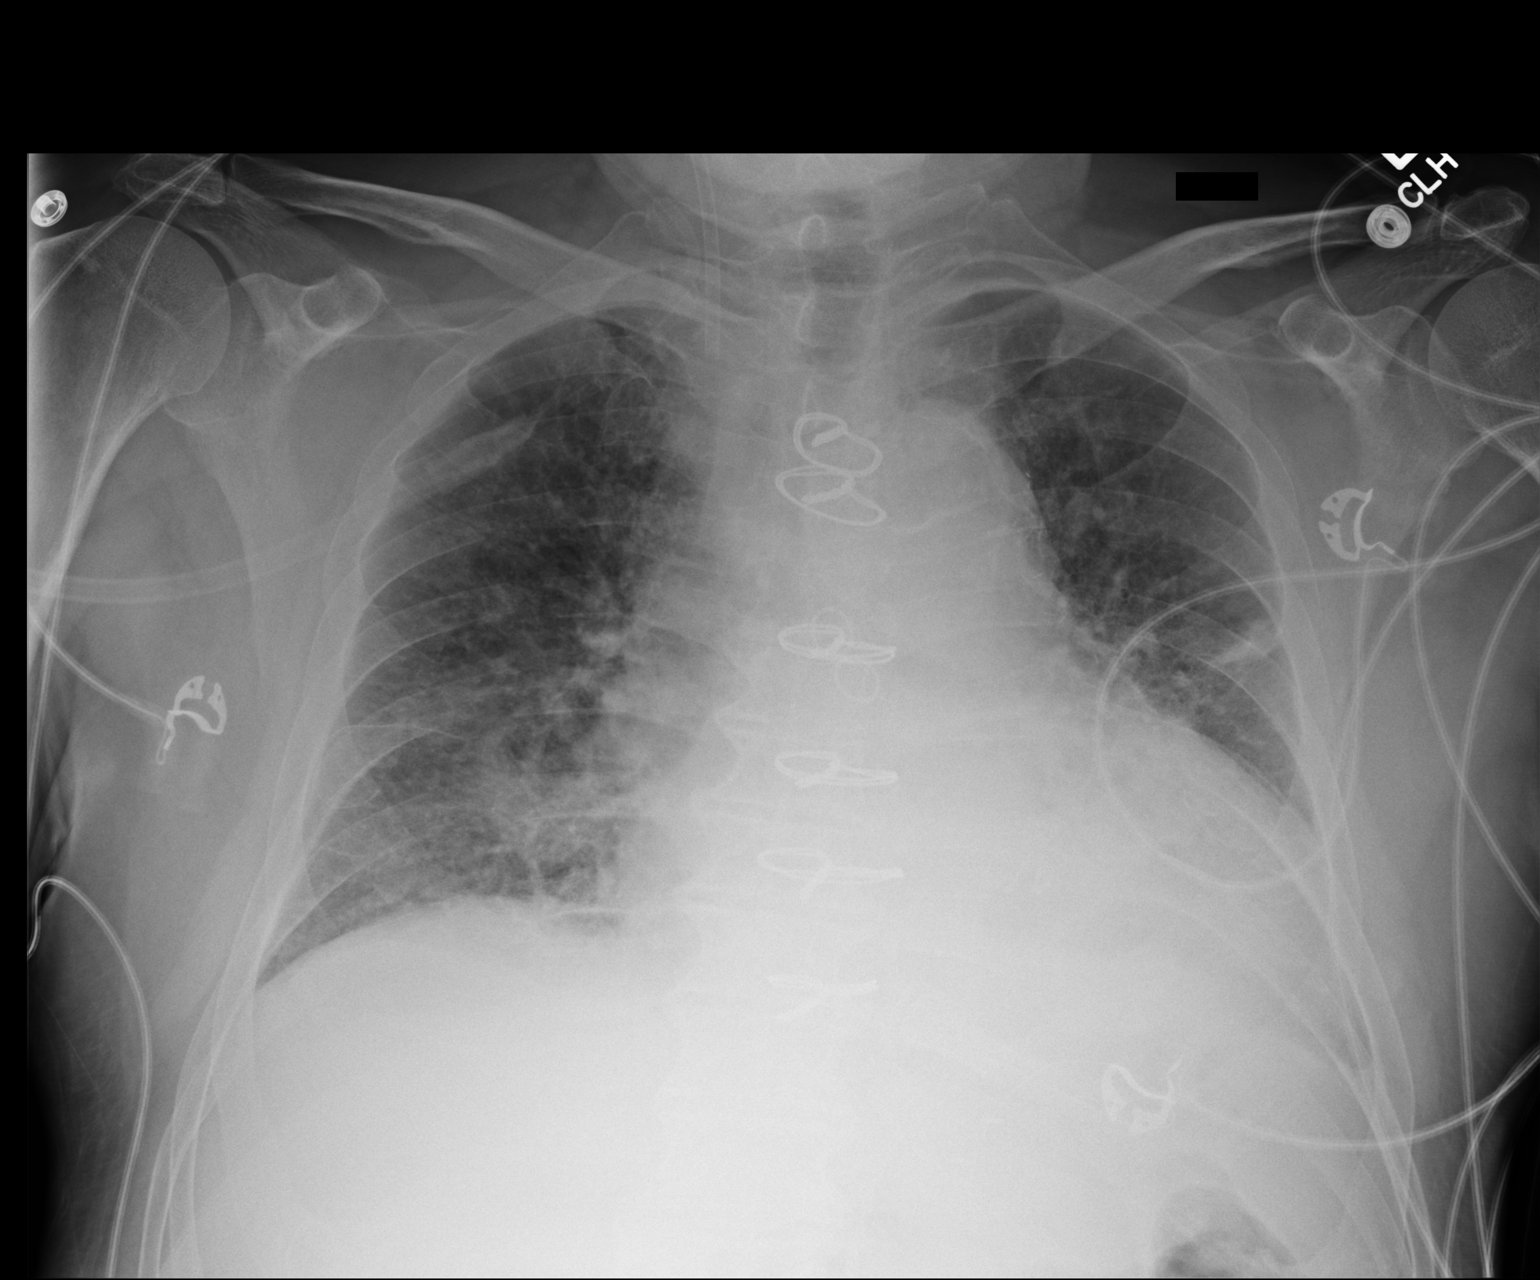

[1 of 1 positions shown; findings below may reference images not displayed]

FINDINGS: Right IJ sheath terminates in the proximal superior vena cava.
Interval removal of Swan-Ganz catheter, mediastinal drain,
left-sided chest tube. Negative for pneumothorax. Status post median
sternotomy for CABG. Stable cardiomegaly. Mild pulmonary vascular
congestion is present. Areas of bibasilar and left mid lung
atelectasis. Slight interstitial prominence bilaterally. No visible
pleural effusion.
IMPRESSION: Cardiomegaly with mild pulmonary vascular congestion and probable
mild interstitial edema.

Bibasilar and left mid lung atelectasis.

## 2015-03-27 IMAGING — DX DG CHEST 2V
2 series · 2 of 2 positions shown · non-contrast
Comparison: Portable chest x-ray October 21, 2014

CLINICAL DATA: Status post CABG now with significant chest soreness
as well as weakness

EXAM:
CHEST  2 VIEW

[chest pa]
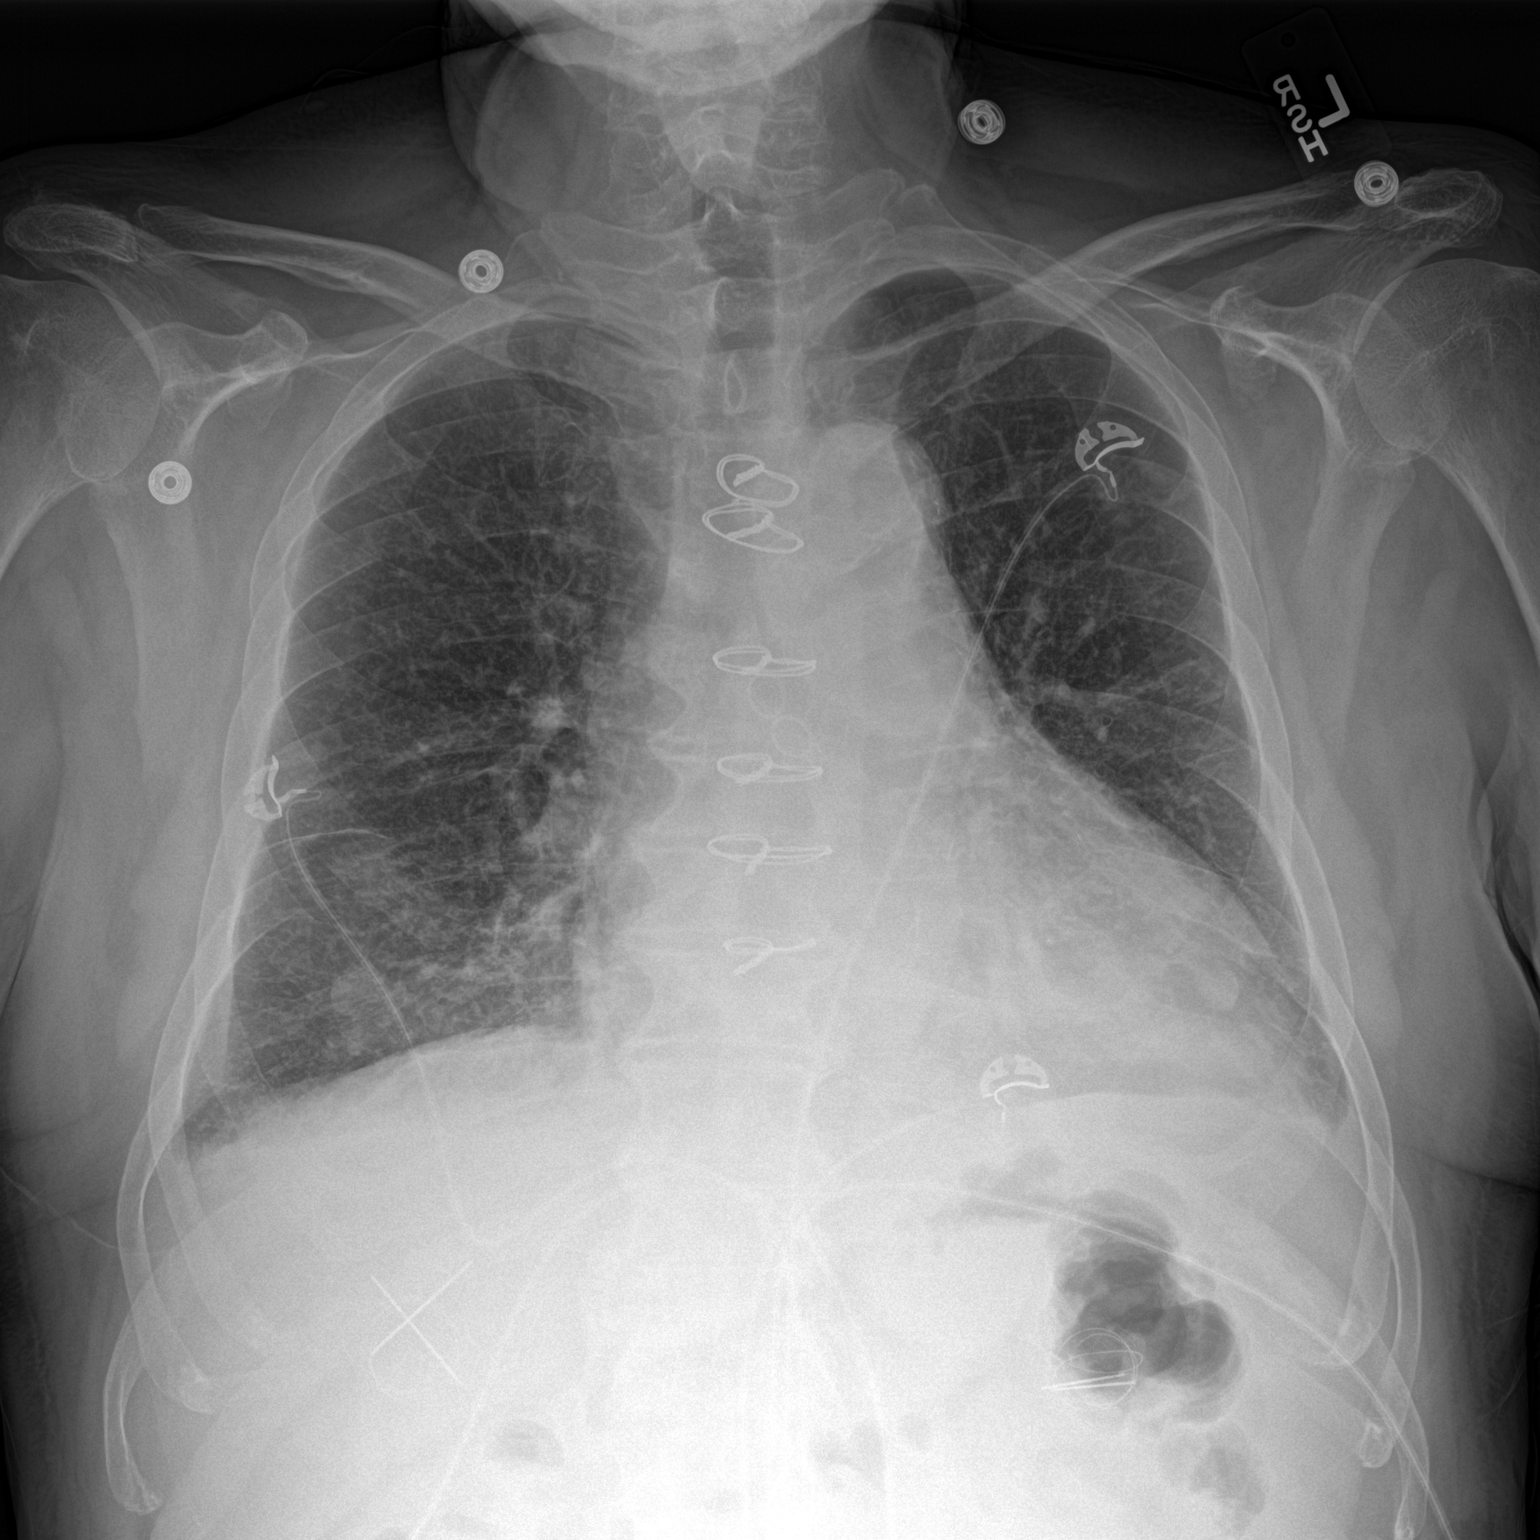

[chest lat]
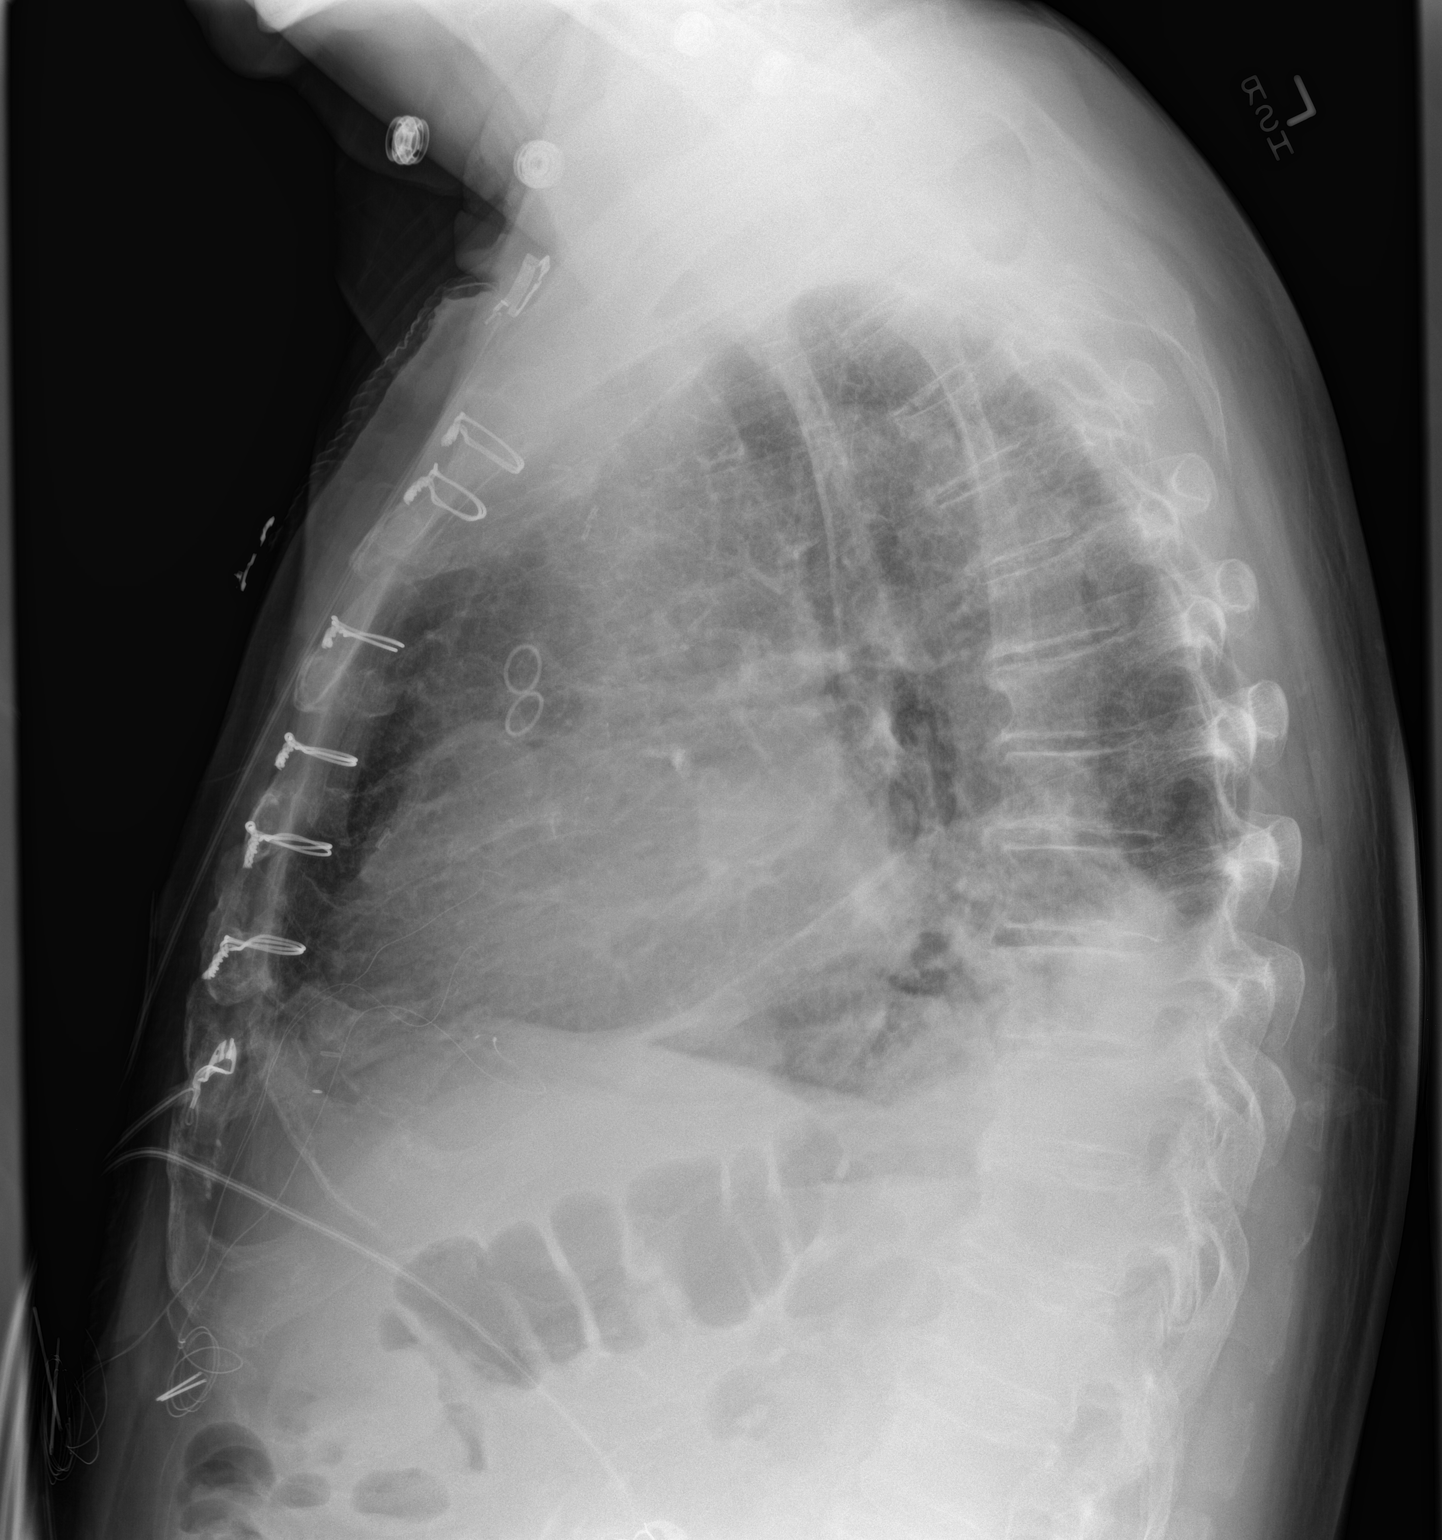

[2 of 2 positions shown; findings below may reference images not displayed]

FINDINGS: The lungs are better inflated. The interstitial markings are
slightly less conspicuous. There is persistent increased
retrocardiac density on the left consistent with atelectasis or
pneumonia. There are small bilateral pleural effusions. The cardiac
silhouette remains enlarged. The pulmonary vascularity is more
distinct today. There are 6 intact sternal wires present. The bony
thorax unremarkable otherwise.
IMPRESSION: Left lower lobe atelectasis or pneumonia. There are small bilateral
pleural effusions. Pulmonary interstitial edema has improved further
since yesterday's study.

## 2015-03-30 NOTE — H&P (Signed)
PATIENT NAME:  Craig Page MR#:  [id number] DATE OF BIRTH:  May 31, 1946  DATE OF ADMISSION:  09/21/2014   CHIEF COMPLAINT: Shortness of breath.   HISTORY OF PRESENT ILLNESS: Craig Page is a 69 year old male with a history of hypertension and coronary artery disease in the past, and non-Hodgkin's lymphoma currently not on treatment, who comes to the Emergency Department with the sudden onset of shortness of breath which started since this evening. Around 4:00 in the evening, the patient did not feel well. Two hours later, the patient started to experience severe shortness of breath. Concerning this, he called his son-in-law, who called EMS. The patient was brought to the Emergency Department.   Workup in the Emergency Department, an EKG and cardiac enzymes, were unremarkable. The patient denied having any chest pain. His chest x-ray showed questionable pulmonary edema versus pneumonia. The patient did not have any fever. Denies having any cough. The patient was found to have elevated white blood cell count of 18,000 with a BNP of 5000. The patient was placed on BiPAP secondary to low oxygen saturations at the time of the presentation.   PAST MEDICAL HISTORY:  1.  Lymphoma.  2.  Coronary artery disease, status post MI in the 1990s; had a left heart catheterization which showed nonobstructive coronary artery disease.  3.  Continued tobacco use.  4.  Hypertension.   PAST SURGICAL HISTORY: None.   ALLERGIES: No known drug allergies.   HOME MEDICATIONS:  1.  Meclizine 12.5 mg as needed.  2.  Lisinopril 20 mg daily.  3.  Hydrochlorothiazide 25 mg daily.  4.  Atenolol 25 mg daily.  5.  Aspirin 81 mg daily.   SOCIAL HISTORY: Continues to smoke 1-2 packs a day. Denies drinking alcohol or using illicit drugs. Married; lives with his wife.   FAMILY HISTORY: Coronary artery disease.   REVIEW OF SYSTEMS: All systems are reviewed and are found to be negative, except as mentioned in the history of present  illness.   PHYSICAL EXAMINATION:  GENERAL: This is a well-built, well-nourished, age-appropriate male, lying down in the bed, not in distress.  VITAL SIGNS: Temperature 97, pulse 96, blood pressure 146/84, respiratory rate of 20, oxygen saturation is 95% on BiPAP.  HEENT: Head normocephalic, atraumatic. There is no scleral icterus. Conjunctivae normal. Pupils equal and react to light. Mucous membranes moist. No pharyngeal erythema.  NECK: Supple. No lymphadenopathy. No JVD. No carotid bruit.  CHEST: No focal tenderness. Bibasilar crackles are heard.  HEART: S1, S2 regular. No murmurs are heard.  ABDOMEN: Bowel sounds present. Soft, nontender, nondistended.  Could not appreciate any hepatosplenomegaly.  EXTREMITIES: No pedal edema. Pulses 2+.  NEUROLOGIC: The patient is alert, oriented to place, person, and time. Cranial nerves II through XII are intact. Motor is 5/5 in the upper and lower extremities.  SKIN: No rash or lesions.  MUSCULOSKELETAL: Good range of motion in all the extremities.   LABORATORY DATA: CBC: WBC 15, hemoglobin 14, platelet count of 206,000.  BMP: Potassium 3.4; the rest of the values are within normal limits. Urinalysis negative for nitrites and leukocyte esterase.   ASSESSMENT AND PLAN: Craig Page is a 69 year old male who comes with congestive heart failure of new onset.  1.  Congestive heart failure. Will obtain echocardiogram, admit the patient to a monitored bed, cycle cardiac enzymes x 3. This sounds like a pneumonia, as this seems to more of a sudden onset.  The patient did not have any symptoms throughout the day. Considering  the patient's leukocytosis and hypoxic respiratory failure, we will also keep the patient on levofloxacin.  2.  Hypertension. Currently well controlled. Continue with the home medications.  3.  Continued tobacco use. Counseled the patient.  4.  Hypokalemia. Replace by mouth.  5.  Keep the patient on deep vein thrombosis prophylaxis with  Lovenox.   TIME SPENT: 50 minutes     ____________________________ Monica Becton, MD pv:MT D: 09/22/2014 04:43:46 ET T: 09/22/2014 05:59:44 ET JOB#: [id number]  cc: Monica Becton, MD, <Dictator> Monica Becton MD ELECTRONICALLY SIGNED 10/06/2014 23:18

## 2015-03-30 NOTE — H&P (Signed)
PATIENT NAME:  Craig Page, Craig Page MR#:  [id number] DATE OF BIRTH:  1946/11/29  DATE OF ADMISSION:  10/15/2014   PRIMARY CARE PHYSICIAN: Larene Beach, MD  CARDIOLOGIST:  Neoma Laming, MD   CHIEF COMPLAINT:  Presyncope and dizziness.   HISTORY OF PRESENT ILLNESS: This is a 69 year old male who presents to the hospital from his cardiologist's office due to a presyncopal episode.  The patient was recently admitted to the hospital about 3 weeks ago for congestive heart failure and discharged home. During the hospitalization, he was noted to have cardiomyopathy with ejection fraction of 25% to 30%.  He was going to be followed by Dr. Humphrey Rolls as an outpatient and underwent a stress test which was abnormal showing some ischemia.  He underwent a cardiac CT today, which showed significant coronary artery disease. While he was at his cardiologist's office, after the cardiac CT, he had a presyncopal episode with some dizziness. He was going to be scheduled for elective cardiac catheterization later this week, but due to his active symptoms, being in anginal equivalent, he is being admitted for further evaluation. The patient denies any acute chest pain, any nausea, any vomiting, any abdominal pain, fevers, chills, cough, or any other associated symptoms presently.   REVIEW OF SYSTEMS:  CONSTITUTIONAL: No documented fever. No weight gain, no weight loss.  EYES: No blurry or double vision.  ENT: No tinnitus. No postnasal drip. No redness of the oropharynx.  RESPIRATORY: No cough, no wheeze or hemoptysis. No dyspnea.  CARDIOVASCULAR: No chest pain, no orthopnea, no palpitations, positive presyncope.  GASTROINTESTINAL: No nausea. No vomiting, diarrhea or abdominal pain. No melena or hematochezia.  GENITOURINARY: No dysuria or hematuria.  ENDOCRINE: No polyuria or nocturia. No heat or cold intolerance.  HEMATOLOGIC: No anemia. No bruising. No bleeding.  INTEGUMENTARY: No rashes. No lesions.  MUSCULOSKELETAL: No  arthritis. No swelling. No gout. NEUROLOGIC: No numbness, tingling. No ataxia. No seizure-type activity no anxiety.  PSYCHIATRIC:  No anxiety, no insomnia. No ADD.   PAST MEDICAL HISTORY: Consistent with history of ischemic cardiomyopathy, ejection fraction of 25% to 30%, history of congestive heart failure, history of follicular lymphoma in remission, hypertension, and coronary disease.   ALLERGIES: No known drug allergies.   SOCIAL HISTORY: Does have a 40 to 50 pack-year smoking history, quit about 3 weeks ago. No alcohol abuse. No illicit drug abuse.  Lives at home with his wife.   FAMILY HISTORY: Mother and father are both deceased. Mother died from complications of heart disease.  Father died from cirrhosis of liver.   CURRENT MEDICATIONS: As follows: Aldactone 25 mg daily; potassium 20 mEq daily, Lasix 40 mg 1/2 tab daily, Coreg 12.5 mg b.i.d., Lipitor 40 mg daily, meclizine as needed, aspirin 81 mg daily. Entresto 24/26, 1 tablet twice daily.   PHYSICAL EXAMINATION: Presently is as follows: VITAL SIGNS:  Temperature 97.2, pulse 38, respirations 18, blood pressure 85/62, saturations 99% on room air.  GENERAL: He is a pleasant-appearing male in no apparent distress.  HEAD, EYES, EARS, NOSE AND THROAT: Atraumatic, normocephalic. Extraocular muscles are intact. Pupils equal and reactive to light. Sclerae anicteric. No conjunctival injection. No pharyngeal erythema.  NECK: Supple. There is no jugular venous distention. No bruits, no lymphadenopathy, no thyromegaly.  HEART: Regular rate and rhythm, bradycardic. No murmurs, no rubs, no clicks.  LUNGS: Clear to auscultation bilaterally. No rales or rhonchi. No wheezes.  ABDOMEN: Soft, flat, nontender, nondistended. Has good bowel sounds. No hepatosplenomegaly appreciated.  EXTREMITIES: No evidence of any cyanosis,  clubbing, or peripheral edema.  Has +2 pedal and radial pulses bilaterally.  NEUROLOGICAL: The patient is alert, awake, and oriented  x 3 with no focal motor or sensory deficits bilaterally.  SKIN: Moist and warm with no rashes appreciated.  LYMPHATIC: There is no cervical or axillary lymphadenopathy.   LABORATORY DATA: Serum glucose 90, BUN 20, creatinine 1.3, sodium 137, potassium 5.5, chloride 104, bicarbonate 27.  The patient's LFTs are within normal limits. Troponin less than 0.02.   The patient's CBC is currently pending.   The patient did have a chest x-ray done which showed no acute cardiopulmonary disease. The patient's EKG shows sinus bradycardia with no acute ST or T wave changes.   ASSESSMENT AND PLAN: This is a 69 year old male with a past medical history of follicular lymphoma, hypertension, coronary artery disease, ischemic cardiomyopathy, ejection fraction of 25% to 30%, history of congestive heart failure, who presents to the hospital from his cardiologist's office due to a presyncopal episode.  1.  Presyncope. There is some concern that the patient's presyncopal event is likely anginal equivalent.  He was recently admitted to the hospital, was known to have ischemic cardiomyopathy with an ejection fraction of 25% to 30%.  He had a positive outpatient stress test and a positive cardiac CT.  There is no evidence of any neurogenic syncope presently. The patient is likely to go for cardiac catheterization later today.  For now, we will continue aspirin, continue statin, hold beta blocker given his severe bradycardia.  We will consult cardiology. The patient followed with Dr. Humphrey Rolls who plans on performing a catheterization later today. 2.  Hypertension. The patient's blood pressures is a little on low side.  He is also bradycardic.  I will hold his beta blocker and his Entresto for now.  3.  History of congestive heart failure. The patient clinically is not in congestive heart failure. I will continue his Lasix, hold Aldactone given the mild hyperkalemia. Hold his beta blocker due to bradycardia.  4.  Bradycardia. He is  clinically asymptomatic. His heart rate is less than 40 at times. Questionable if this is ischemia-related or related to underlying use of beta blockers.  I will hold his Coreg for now, await further cardiology input and cardiac catheterization results.  5.  Hyperlipidemia. Continue statin. 6.  History of peripheral lymphoma, currently in remission followed by Dr. Forest Gleason.   The patient is a FULL CODE.   TIME SPENT ON ADMISSION:  50 minutes.  ____________________________ Belia Heman. Verdell Carmine, MD vjs:DT D: 10/15/2014 12:48:47 ET T: 10/15/2014 13:10:24 ET JOB#: [id number]  cc: Belia Heman. Verdell Carmine, MD, <Dictator> Henreitta Leber MD ELECTRONICALLY SIGNED 10/26/2014 12:38

## 2015-03-30 NOTE — Consult Note (Signed)
PATIENT NAME:  Craig, Page MR#:  [id number] DATE OF BIRTH:  [birth year]  DATE OF CONSULTATION:  09/22/2014   CONSULTING PHYSICIAN:  Isaias Cowman, MD  PRIMARY CARE PHYSICIAN:   Dr. Luan Pulling.   CHIEF COMPLAINT: Shortness of breath.   HISTORY OF PRESENT ILLNESS:  The patient is a 69 year old gentleman who is admitted with a new onset shortness of breath.  The patient reports a remote history of myocardial infarction in 1992 with admission at [facility].  No angioplasty or bypass surgery was performed.  The patient has a history of non-Hodgkin's lymphoma. He was in his usual state of health until the last 2 evenings when he started to develop shortness of breath and wheezing. Last evening, the patient developed acute respiratory distress, EMS was called, and the patient was brought to [facility]. Chest x-ray revealed pulmonary edema versus pneumonia. The patient had elevated white count of 18,000 and the patient was placed on BiPAP.  Admission labs were notable for borderline elevated troponin of 0.12 in the absence of chest pain. ECG did not reveal any acute ischemic ST-T wave changes.  Today, the patient continues to deny chest pain. His breathing has improved overnight.   PAST MEDICAL HISTORY:   1.  Status post apparent MI with unremarkable cardiac catheterization in 1992 at [facility],  2,  Non-Hodgkin's lymphoma,' 3.  History of apparent sepsis 10 years ago.  4.  Hypertension.   MEDICATIONS: Aspirin 81 mg daily, atenolol 25 mg daily, lisinopril 20 mg daily, hydrochlorothiazide 25 mg daily, meclizine 12.5 mg p.r.n.   SOCIAL HISTORY: The patient is married, resides with his wife, smokes 1 to 2 packs of cigarettes a day.   FAMILY HISTORY:  Notable for coronary artery disease.   REVIEW OF SYSTEMS:.  CONSTITUTIONAL: No fever or chills. The patient did have some diaphoresis.  EYES: No blurry vision.  EARS: No hearing loss.  RESPIRATORY: Shortness of breath with wheezing  as described above.  CARDIOVASCULAR: No chest pain.  GASTROINTESTINAL: No nausea, vomiting, or diarrhea.  GENITOURINARY: No dysuria or hematuria.  ENDOCRINE: No polyuria or polydipsia.  MUSCULOSKELETAL: No arthralgias or myalgias.  NEUROLOGICAL: No focal muscle weakness or numbness.  PSYCHOLOGICAL: No depression or anxiety.   PHYSICAL EXAMINATION:  VITAL SIGNS:  Blood pressure 127/79, pulse 62, respirations 18, temperature 98.2, pulse oximetry 94%.  HEENT: Pupils equal, reactive to light and accommodation.  NECK: Supple without thyromegaly.  LUNGS: Decreased breath sounds in both bases.  HEART: Normal JVP.  Normal PMI.  Regular rate and rhythm. Normal S1, S2. No appreciable gallop, murmur, or rub.  ABDOMEN: Soft and nontender. Pulses were intact bilaterally.  MUSCULOSKELETAL: Normal muscle tone.  NEUROLOGIC: The patient is alert and oriented x 3. Motor and sensory both grossly intact.   IMPRESSION: A 69 year old gentleman with known coronary artery disease, who presents with a 1 to 2 day history of progressive shortness of breath with elevated white count more consistent with pneumonia.  The patient appears to be clinically stable today. The patient had borderline elevated troponin likely due to demand supply ischemia in the absence of chest pain or ECG changes.   RECOMMENDATIONS:  1.  Agree with overall current therapy.    2.  Would discontinue full dose heparin drip.  3.  Review 2-D echocardiogram.  4.  Further recommendations pending echocardiogram results.     ____________________________ Isaias Cowman, MD ap:DT D: 09/22/2014 09:44:13 ET T: 09/22/2014 10:20:55 ET JOB#: [id number]  cc: Isaias Cowman, MD, <Dictator> Louise Victory  MD ELECTRONICALLY SIGNED 09/22/2014 14:13

## 2015-03-30 NOTE — Discharge Summary (Signed)
PATIENT NAME:  Craig Page, Craig Page MR#:  [id number] DATE OF BIRTH:  10/11/1946  DATE OF ADMISSION:  09/21/2014 DATE OF DISCHARGE:  09/23/2014  DISCHARGE DIAGNOSES: 1.  Acute on chronic systolic congestive heart failure with ejection fraction of 30%.  2.  Acute respiratory failure.  3.  Hypertension.  4.  Tobacco abuse.  5.  Bronchitis.  6.  Chronic kidney disease stage 3.  CONSULTATIONS: Dionisio David, MD with cardiology.   IMAGING STUDIES DONE: Include an echocardiogram cardiogram which showed EF of 25 to 30%, and RCA distribution and entire inferior septum are  abnormal.   RADIOGRAPHS: Chest x-ray portable shows mild cardiac enlargement and moderate pulmonary edema.   HISTORY OF PRESENT ILLNESS:  Please see detailed H and P dictated previously. In brief, a 69 year old male patient presented to the hospital complaining of orthopnea, shortness of breath. Was admitted to hospitalist service.   HOSPITAL COURSE:   1.  Acute on chronic systolic CHF with acute respiratory failure. The patient diuresed well with IV Lasix which will be continued at home with daily dosing along with fluid restriction, low-salt diet. The patient had an echo done, which showed low EF of 25 to 30%. He was seen by Dr. Humphrey Rolls with cardiology, who has asked him to follow up in his office on 09/25/2014 for a Lexiscan stress test. The patient will likely need further workup depending on results of the [place].  The patient by day of discharge had ambulated well without trouble breathing, feels significantly better and is being discharged home in a fair condition with Lasix, beta blocker.  2.  Acute bronchitis. The patient also had some bronchitis with leukocytosis for which he will be on antibiotics for 5 more days.  3.  Hypertension, well controlled.  4.  Tobacco abuse. The patient was counseled to quit smoking during this admission.   DISCHARGE PHYSICAL EXAMINATION:   LUNG EXAMINATION: Prior to discharge, the patient has no  wheezing or crackles.  HEART SOUNDS: S1, S2. No edema found. GENERAL: He is alert and oriented x3.   DISCHARGE MEDICATIONS: Include: 1.  Aspirin 81 mg daily.  2.  Meclizine 12.5 mg daily as needed.  3.  Lisinopril 10 mg daily.  4.  Aldactone 25 mg daily.  5.  Coreg 12.5 mg oral 2 times a day.  6.  Lasix 40 mg daily.  7.  Potassium chloride 20 mEq daily.  8.  Atorvastatin 40 mg daily.  9.  Levaquin 5 mg oral once a day for 5 days.   DISCHARGE INSTRUCTIONS: Low-sodium diet, daily fluids less than 2 liters. No exertional activity or heavy lifting till patient gets stress test with Dr. Humphrey Rolls. Follow up with PCP in 1 to 2 weeks.   Time spent on day of discharge in discharge activity was 40 minutes.   ____________________________ Leia Alf Lulla Linville, MD srs:jw D: 09/25/2014 12:58:00 ET T: 09/25/2014 13:09:05 ET JOB#: [id number]  cc: Alveta Heimlich R. Kazaria Gaertner, MD, <Dictator> Neita Carp MD ELECTRONICALLY SIGNED 10/03/2014 10:47

## 2015-03-30 NOTE — Consult Note (Signed)
PATIENT NAME:  Craig Page, Craig Page MR#:  [id number] DATE OF BIRTH:  01-30-1946  DATE OF CONSULTATION:  10/15/2008   CONSULTING PHYSICIAN:  Dionisio David, MD   INDICATION FOR CONSULTATION: An episode where the patient had dizzy spell and nearly passed out.  HISTORY OF PRESENT ILLNESS:  This is a 69 year old white male with a past medical history of MI, history of cardiomyopathy who presented to the hospital with CHF, underwent stress test.  The stress test, as an outpatient was done on 09/27/2014, which showed ejection fraction of 32%, severely dilated LV, large fixed inferior and lateral wall defect. He underwent CCTA on 10/08/2014 with CT coronaries revealed a calcium score of 1607.5, right dominant high-grade lesion in the mid to distal left circumflex, moderate disease in the LAD and RCA with severe calcification.  It was advised to further evaluate the patient because of severe calcification. Advise further evaluation by cardiac catheterization. The patient was seen in the office today and was doing fine, and cardiac catheter was scheduled for tomorrow; however, in the meantime the patient started feeling dizzy, diaphoretic at the check out and nearly passed out.   911 was called and the patient was transferred to the Emergency Room.  In the ER, his heart rate was 36. He is feeling better; no longer diaphoretic.   PAST MEDICAL HISTORY: History of history of MI in 1992, history of hypertension, history of hyperlipidemia.   MEDICATIONS: Carvedilol 12.5 twice a day, aspirin 81 mg once a day and  , furosemide 40 mg once a day, Lipitor 40 mg once a day, spironolactone 25 mg once a day, meclizine 25 mg t.i.d., potassium 20 mEq.   PHYSICAL EXAMINATION:  GENERAL: He is alert, oriented x 3, in no acute distress right now and his heart rate is 36, blood pressure 110/70, respirations 18.  HEENT:  Reveal no JVD.  LUNGS: Clear.  HEART: Regular rate and rhythm. Normal S1, S2. No audible murmur.  ABDOMEN: Soft,  nontender, positive bowel sounds.  EXTREMITIES: No pedal edema.  NEUROLOGIC: The patient appears to be intact.   LABS/STUDIES: EKG shows sinus rhythm 36 beats per minute. No acute changes.   ASSESSMENT AND PLAN: The patient has a possible high-grade lesion in the left circumflex and moderate disease in the LAD and RCA. The patient also has severe left ventricular dysfunction, ejection fraction of 32%. He was started on carvedilol. Dizziness may be related to bradycardia, but since the CCTA was positive with calcium score of 1600, advise cardiac catheterization.     ____________________________ Dionisio David, MD sak:DT D: 10/15/2014 11:34:50 ET T: 10/15/2014 12:06:07 ET JOB#: [id number]  cc: Dionisio David, MD, <Dictator> Dionisio David MD ELECTRONICALLY SIGNED 11/08/2014 15:37

## 2015-03-30 NOTE — Discharge Summary (Signed)
PATIENT NAME:  Craig Page, Craig Page MR#:  [id number] DATE OF BIRTH:  06/12/1946  DATE OF ADMISSION:  10/15/2014 DATE OF DISCHARGE:  10/16/2014  PRIMARY CARE PHYSICIAN:  Dr. Luan Pulling  CONSULTATIONS: Cardiology, Dr. Humphrey Rolls.  The patient will be transferred to [facility] for possible CABG.   REASON FOR ADMISSION: Presyncope and dizziness.   HOSPITAL COURSE: The patient is a 69 year old Caucasian male with past history of ischemic cardiomyopathy, ejection fraction 25% to 30%, congestive heart failure, follicular lymphoma, hypertension, CAD presents to the ED for syncope and dizziness. The patient underwent cardiac CT which showed significant coronary artery disease.  For detailed history and physical examination, please refer to the admission note dictated by Dr. Abel Presto.    LABORATORY DATA:  Unremarkable. The patient's troponin level has been negative. He has been treated with aspirin and beta blockers was on hold due to bradycardia.  Dr. Humphrey Rolls did a cardiac catheter which showed 3-vessel disease.  Dr. Humphrey Rolls advised the patient to be transferred to [facility] for a CABG.    Dr. Humphrey Rolls contacted [facility].  The patient will be transferred to [facility] today.  The patient has no chest pain after admission and he has no complaints. Vital signs are stable. Physical examination is unremarkable.  He will be transferred to [facility] today.  I discussed the patient's discharge plan with the patient's nurse, Dr. Humphrey Rolls, and case manager    TIME SPENT: About 63 minutes.    ____________________________ Demetrios Loll, MD qc:DT D: 10/16/2014 11:06:27 ET T: 10/16/2014 11:43:34 ET JOB#: [id number]  cc: Demetrios Loll, MD, <Dictator> Demetrios Loll MD ELECTRONICALLY SIGNED 10/16/2014 15:47

## 2015-04-02 ENCOUNTER — Ambulatory Visit
Admit: 2015-04-02 | Disposition: A | Discharge status: Home / Self Care | Payer: Self-pay | Attending: Oncology | Admitting: Oncology

## 2015-04-09 ENCOUNTER — Inpatient Hospital Stay: Payer: Commercial Managed Care - HMO

## 2015-05-30 ENCOUNTER — Other Ambulatory Visit (HOSPITAL_COMMUNITY): Payer: Self-pay | Admitting: Anesthesiology

## 2015-06-22 ENCOUNTER — Other Ambulatory Visit (HOSPITAL_COMMUNITY): Payer: Self-pay | Admitting: Internal Medicine

## 2015-07-27 ENCOUNTER — Other Ambulatory Visit (HOSPITAL_COMMUNITY): Payer: Self-pay | Admitting: Anesthesiology

## 2015-08-05 ENCOUNTER — Other Ambulatory Visit (HOSPITAL_COMMUNITY): Payer: Self-pay | Admitting: Internal Medicine

## 2015-08-22 ENCOUNTER — Telehealth: Payer: Self-pay | Admitting: *Deleted

## 2015-08-22 NOTE — Telephone Encounter (Signed)
Feeling dizzy and weak, went to see PMD who sent him to cardiology (Dr Dorena Cookey) and they drew labs and called him this morning and said he needs to see Dr Oliva Bustard ASAP. Does not know what labs were abnormal. I have called [facility] to get labs faxed over

## 2015-08-22 NOTE — Telephone Encounter (Signed)
Appt rescheduled to 9/20. Agrees to changes

## 2015-08-23 ENCOUNTER — Other Ambulatory Visit: Payer: Self-pay | Admitting: *Deleted

## 2015-08-23 DIAGNOSIS — C8590 Non-Hodgkin lymphoma, unspecified, unspecified site: Secondary | ICD-10-CM

## 2015-08-26 ENCOUNTER — Telehealth: Payer: Self-pay | Admitting: Family Medicine

## 2015-08-26 NOTE — Telephone Encounter (Signed)
[facility] called states that pt have  appt in then morning need a referral   ICD 10 Z85.79 ,NPI [id number]  Dr. Alberteen Sam?

## 2015-08-26 NOTE — Telephone Encounter (Signed)
Approved. [facility]

## 2015-08-27 ENCOUNTER — Inpatient Hospital Stay: Payer: Commercial Managed Care - HMO

## 2015-08-27 ENCOUNTER — Encounter: Payer: Self-pay | Admitting: Oncology

## 2015-08-27 ENCOUNTER — Other Ambulatory Visit: Payer: Self-pay | Admitting: *Deleted

## 2015-08-27 ENCOUNTER — Inpatient Hospital Stay: Payer: Commercial Managed Care - HMO | Attending: Oncology | Admitting: Oncology

## 2015-08-27 VITALS — BP 80/54 | HR 74 | Temp 95.1°F | Wt 162.4 lb

## 2015-08-27 DIAGNOSIS — Z7982 Long term (current) use of aspirin: Secondary | ICD-10-CM | POA: Insufficient documentation

## 2015-08-27 DIAGNOSIS — R161 Splenomegaly, not elsewhere classified: Secondary | ICD-10-CM | POA: Diagnosis not present

## 2015-08-27 DIAGNOSIS — I5022 Chronic systolic (congestive) heart failure: Secondary | ICD-10-CM | POA: Insufficient documentation

## 2015-08-27 DIAGNOSIS — I255 Ischemic cardiomyopathy: Secondary | ICD-10-CM | POA: Diagnosis not present

## 2015-08-27 DIAGNOSIS — Z951 Presence of aortocoronary bypass graft: Secondary | ICD-10-CM | POA: Insufficient documentation

## 2015-08-27 DIAGNOSIS — Z87891 Personal history of nicotine dependence: Secondary | ICD-10-CM | POA: Insufficient documentation

## 2015-08-27 DIAGNOSIS — D72829 Elevated white blood cell count, unspecified: Secondary | ICD-10-CM

## 2015-08-27 DIAGNOSIS — D649 Anemia, unspecified: Secondary | ICD-10-CM | POA: Diagnosis not present

## 2015-08-27 DIAGNOSIS — C8590 Non-Hodgkin lymphoma, unspecified, unspecified site: Secondary | ICD-10-CM

## 2015-08-27 DIAGNOSIS — I252 Old myocardial infarction: Secondary | ICD-10-CM | POA: Diagnosis not present

## 2015-08-27 DIAGNOSIS — Z79899 Other long term (current) drug therapy: Secondary | ICD-10-CM

## 2015-08-27 DIAGNOSIS — I10 Essential (primary) hypertension: Secondary | ICD-10-CM | POA: Insufficient documentation

## 2015-08-27 DIAGNOSIS — E785 Hyperlipidemia, unspecified: Secondary | ICD-10-CM | POA: Diagnosis not present

## 2015-08-27 DIAGNOSIS — I959 Hypotension, unspecified: Secondary | ICD-10-CM

## 2015-08-27 DIAGNOSIS — I2510 Atherosclerotic heart disease of native coronary artery without angina pectoris: Secondary | ICD-10-CM | POA: Diagnosis not present

## 2015-08-27 LAB — COMPREHENSIVE METABOLIC PANEL
ALBUMIN: 2.6 g/dL — AB (ref 3.5–5.0)
ALT: 15 U/L — ABNORMAL LOW (ref 17–63)
ANION GAP: 7 (ref 5–15)
AST: 15 U/L (ref 15–41)
Alkaline Phosphatase: 189 U/L — ABNORMAL HIGH (ref 38–126)
BILIRUBIN TOTAL: 0.7 mg/dL (ref 0.3–1.2)
BUN: 21 mg/dL — AB (ref 6–20)
CHLORIDE: 106 mmol/L (ref 101–111)
CO2: 22 mmol/L (ref 22–32)
Calcium: 7.9 mg/dL — ABNORMAL LOW (ref 8.9–10.3)
Creatinine, Ser: 1.25 mg/dL — ABNORMAL HIGH (ref 0.61–1.24)
GFR calc Af Amer: >60 mL/min (ref >60)
GFR calc non Af Amer: 57 mL/min — ABNORMAL LOW (ref >60)
GLUCOSE: 120 mg/dL — AB (ref 65–99)
POTASSIUM: 4.2 mmol/L (ref 3.5–5.1)
SODIUM: 135 mmol/L (ref 135–145)
TOTAL PROTEIN: 5.7 g/dL — AB (ref 6.5–8.1)

## 2015-08-27 LAB — CBC WITH DIFFERENTIAL/PLATELET
BASOS ABS: 0.1 10*3/uL (ref 0–0.1)
Basophils Relative: 1 %
EOS PCT: 4 %
Eosinophils Absolute: 0.9 10*3/uL — ABNORMAL HIGH (ref 0–0.7)
HCT: 29.5 % — ABNORMAL LOW (ref 40.0–52.0)
Hemoglobin: 9.4 g/dL — ABNORMAL LOW (ref 13.0–18.0)
LYMPHS PCT: 8 %
Lymphs Abs: 1.9 10*3/uL (ref 1.0–3.6)
MCH: 24.7 pg — ABNORMAL LOW (ref 26.0–34.0)
MCHC: 32 g/dL (ref 32.0–36.0)
MCV: 77.1 fL — AB (ref 80.0–100.0)
Monocytes Absolute: 1.9 10*3/uL — ABNORMAL HIGH (ref 0.2–1.0)
Monocytes Relative: 8 %
Neutro Abs: 17.5 10*3/uL — ABNORMAL HIGH (ref 1.4–6.5)
Neutrophils Relative %: 79 %
PLATELETS: 310 10*3/uL (ref 150–440)
RBC: 3.83 MIL/uL — ABNORMAL LOW (ref 4.40–5.90)
RDW: 18.7 % — AB (ref 11.5–14.5)
WBC: 22.3 10*3/uL — AB (ref 3.8–10.6)

## 2015-08-27 LAB — LACTATE DEHYDROGENASE: LDH: 85 U/L — ABNORMAL LOW (ref 98–192)

## 2015-08-27 NOTE — Progress Notes (Signed)
Craig Page @ [facility] [phone]  Fax:([phone]) Tice Spoon OB: Jan 07, 1946  MR#: [id number]  YOV#:[id number]  Patient Care Team: Arlis Porta., MD as PCP - General (Family Medicine)  CHIEF COMPLAINT:  Chief Complaint  Patient presents with  . Diarrhea    1. 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 Stage IIIa. 2. Finished a maintenance Rituxan in October of 2012. 3. PET scan in March 2015 shows complete remission. PET scan was done in November 2016 in  [place] prior to open heart surgery and was reported to be negative.   Oncology Flowsheet 10/17/2014 10/18/2014 10/19/2014  diazepam (VALIUM) PO - - 5 mg  enoxaparin (LOVENOX) [place] 40 mg 40 mg -    INTERVAL HISTORY:  69 year old gentleman came today further follow-up because of number of complaints.  Over.  Of time patient's condition has been declining.  Having increasing diarrhea for last 4 weeks.  2-3 stools every day. The patient is feeling extremely weak and tired. Had leukocytosis and anemia Patient went to exercise room to get strength back and started having left upper quadrant pain pain is constant dull aching more in the night Blood pressure was low (patient is on number of cardiac medications) No chills or fever Here for further follow-up.  Patient has previous history of follicular lymphoma stage III diagnosis in 2010 status post bendamustine and rituximab Patient had a history of Lyme disease during the spring of 2016 Also had bacterial infection (exact nature not known) REVIEW OF SYSTEMS:    general status: Patient is feeling weak and tired.  No change in a performance status.  No chills.  No fever. HEENT:   No evidence of stomatitis Lungs: No cough or shortness of breath Cardiac: No chest pain or paroxysmal nocturnal dyspnea GI: As mentioned about patient has increasing left upper quadrant pain.   Diarrhea.  Poor appetite. Skin: No rash Lower extremity trace swelling . Neurological system: No tingling.  No numbness.  No other focal signs Musculoskeletal system no bony pains Psychiatric system: Anxiety and depression As per HPI. Otherwise, a complete review of systems is negatve.  PAST MEDICAL HISTORY: Past Medical History  Diagnosis Date  . CAD (coronary artery disease)     3v  . Ischemic cardiomyopathy   . Chronic systolic heart failure   . HTN (hypertension)   . HLD (hyperlipidemia)   . Follicular lymphoma   . Myocardial infarction 1992    treated with thrombolytics/notes 10/16/2014    PAST SURGICAL HISTORY: Past Surgical History  Procedure Laterality Date  . Hernia repair    . Umbilical hernia repair  1964  . Coronary artery bypass graft N/A 10/19/2014    Procedure: CORONARY ARTERY BYPASS GRAFTING (CABG);  Surgeon: Ivin Poot, MD;  Location: [place];  Service: Open Heart Surgery;  Laterality: N/A;  Times 3 using left internal mammary artery and endoscopically harvested left saphenous vein  . Intraoperative transesophageal echocardiogram N/A 10/19/2014    Procedure: INTRAOPERATIVE TRANSESOPHAGEAL ECHOCARDIOGRAM;  Surgeon: Ivin Poot, MD;  Location: [place];  Service: Open Heart Surgery;  Laterality: N/A;    FAMILY HISTORY Family History  Problem Relation Age of Onset  . Heart failure Mother     deceased  . Cirrhosis Father     deceased   Significant History/PMH:   Hypertension:    Hyperlipidemia:    Acute Myocardial Infarction:    CAD:  chf:    lymphoma:    Tobacco Use:    CABG: Nov 2015  PFSH: Comments: daughter also has lymphoma  Comments: positive for tobacco use,  30 years history of smokingdoes not drink  Additional Past Medical and Surgical History: hypocholesteremia   Hypertension   History of coronary artery disease   Gastroesophageal reflux disease    ADVANCED DIRECTIVES:  No flowsheet data found.  HEALTH  MAINTENANCE: Social History  Substance Use Topics  . Smoking status: Former Smoker -- 1.00 packs/day for 50 years    Types: Cigarettes    Quit date: 09/24/2014  . Smokeless tobacco: Never Used  . Alcohol Use: No      Not on File  Current Outpatient Prescriptions  Medication Sig Dispense Refill  . aspirin EC 81 MG tablet Take 1 tablet (81 mg total) by mouth daily. (Patient taking differently: Take 325 mg by mouth daily. ) 30 tablet 3  . atorvastatin (LIPITOR) 40 MG tablet Take 1 tablet (40 mg total) by mouth daily. 30 tablet 1  . carvedilol (COREG) 3.125 MG tablet TAKE 1 TABLET BY MOUTH TWICE DAILY. 180 tablet 4  . furosemide (LASIX) 20 MG tablet Take 1 tablet (20 mg total) by mouth daily. (Patient taking differently: Take 20 mg by mouth daily. PRN) 30 tablet 3  . lisinopril (PRINIVIL,ZESTRIL) 2.5 MG tablet TAKE 1 TABLET BY MOUTH TWICE DAILY 180 tablet 0  . spironolactone (ALDACTONE) 25 MG tablet Take 0.5 tablets (12.5 mg total) by mouth daily. 45 tablet 3  . amiodarone (PACERONE) 200 MG tablet Take 1 tablet (200 mg total) by mouth daily. (Patient not taking: Reported on 08/27/2015) 30 tablet 1   No current facility-administered medications for this visit.    OBJECTIVE:  Filed Vitals:   08/27/15 0917  BP: 80/54  Pulse: 74  Temp: 95.1 F (35.1 C)     Body mass index is 22.66 kg/(m^2).    ECOG FS:1 - Symptomatic but completely ambulatory  PHYSICAL EXAM: Gen. status: Patient is feeling week tired. Head exam was generally normal. There was no scleral icterus or corneal arcus. Mucous membranes were moist. Examination of the chest was unremarkable. There were no bony deformities, no asymmetry, and no other abnormalities. Cardiac: Tachycardia blood pressure is slightly low soft systolic murmur Abdomen: Mild distention.  And large spleen 6-7 cm below costal margin no ascites small sounds are present Lymphatic system: Small palpable lymph node in the left inguinal area.  No other  palpable lymphadenopathy Lower extremity trace edema Neurologically, the patient was awake, alert, and oriented to person, place and time. There were no obvious focal neurologic abnormalities. Examination of the skin revealed no evidence of significant rashes, suspicious appearing nevi or other concerning lesions.    LAB RESULTS:  CBC Latest Ref Rng 08/27/2015 10/23/2014  WBC 3.8 - 10.6 K/uL 22.3(H) 9.0  Hemoglobin 13.0 - 18.0 g/dL 9.4(L) 7.9(L)  Hematocrit 40.0 - 52.0 % 29.5(L) 23.8(L)  Platelets 150 - 440 K/uL 310 153    Appointment on 08/27/2015  Component Date Value Ref Range Status  . WBC 08/27/2015 22.3* 3.8 - 10.6 K/uL Final  . RBC 08/27/2015 3.83* 4.40 - 5.90 MIL/uL Final  . Hemoglobin 08/27/2015 9.4* 13.0 - 18.0 g/dL Final  . HCT 08/27/2015 29.5* 40.0 - 52.0 % Final  . MCV 08/27/2015 77.1* 80.0 - 100.0 fL Final  . MCH 08/27/2015 24.7* 26.0 - 34.0 pg Final  . MCHC 08/27/2015 32.0  32.0 - 36.0 g/dL Final  . RDW 08/27/2015  18.7* 11.5 - 14.5 % Final  . Platelets 08/27/2015 310  150 - 440 K/uL Final  . Neutrophils Relative % 08/27/2015 79   Final  . Neutro Abs 08/27/2015 17.5* 1.4 - 6.5 K/uL Final  . Lymphocytes Relative 08/27/2015 8   Final  . Lymphs Abs 08/27/2015 1.9  1.0 - 3.6 K/uL Final  . Monocytes Relative 08/27/2015 8   Final  . Monocytes Absolute 08/27/2015 1.9* 0.2 - 1.0 K/uL Final  . Eosinophils Relative 08/27/2015 4   Final  . Eosinophils Absolute 08/27/2015 0.9* 0 - 0.7 K/uL Final  . Basophils Relative 08/27/2015 1   Final  . Basophils Absolute 08/27/2015 0.1  0 - 0.1 K/uL Final  . Sodium 08/27/2015 135  135 - 145 mmol/L Final  . Potassium 08/27/2015 4.2  3.5 - 5.1 mmol/L Final  . Chloride 08/27/2015 106  101 - 111 mmol/L Final  . CO2 08/27/2015 22  22 - 32 mmol/L Final  . Glucose, Bld 08/27/2015 120* 65 - 99 mg/dL Final  . BUN 08/27/2015 21* 6 - 20 mg/dL Final  . Creatinine, Ser 08/27/2015 1.25* 0.61 - 1.24 mg/dL Final  . Calcium 08/27/2015 7.9* 8.9 - 10.3  mg/dL Final  . Total Protein 08/27/2015 5.7* 6.5 - 8.1 g/dL Final  . Albumin 08/27/2015 2.6* 3.5 - 5.0 g/dL Final  . AST 08/27/2015 15  15 - 41 U/L Final  . ALT 08/27/2015 15* 17 - 63 U/L Final  . Alkaline Phosphatase 08/27/2015 189* 38 - 126 U/L Final  . Total Bilirubin 08/27/2015 0.7  0.3 - 1.2 mg/dL Final  . GFR calc non Af Amer 08/27/2015 57* >60 mL/min Final  . GFR calc Af Amer 08/27/2015 >60  >60 mL/min Final   Comment: (NOTE) The eGFR has been calculated using the CKD EPI equation. This calculation has not been validated in all clinical situations. eGFR's persistently <60 mL/min signify possible Chronic Kidney Disease.   . Anion gap 08/27/2015 7  5 - 15 Final  . LDH 08/27/2015 85* 98 - 192 U/L Final        ASSESSMENT: Multiple complaints including leukocytosis, anemia, physical findings indicated splenomegaly. Clinical picture is suggestive of recurrent and progressive low-grade lymphoma Proceed with a PET scan If needed biopsy of the lymph node Bone marrow aspiration and biopsy to assess bone marrow involvement Hypotension is secondary to number of cardiac medications   MEDICAL DECISION MAKING:  PET scan (slightly elevated serum creatinine contraindicates CT scan at present time.  And PET scan may be more useful to reassess recurrent lymphoma) and need for any biopsy of lymph node) Bone marrow aspiration and biopsy because of leukocytosis and anemia If needed iron-binding capacity ferritin and serum iron level would be done Total duration of visit was 45 minutes.  50% or more time was spent in counseling patient and family regarding prognosis and options of treatment and available resources  Patient expressed understanding and was in agreement with this plan. He also understands that He can call clinic at any time with any questions, concerns, or complaints.  All the records from patient's primary care physician as well as from the cardiologist have been reviewed. Of  note that patient had abnormal PET scan in November of 2015 A PET scan is normal then evaluation regarding other problem contemplating to the patient's decline and diarrhea would be considered.  No matching staging information was found for the patient.  Forest Gleason, MD   08/27/2015 9:31 AM

## 2015-08-27 NOTE — Progress Notes (Signed)
Patient does not have living will.  Former smoker. Diagnosed with Lyme disease this past summer.  States he has had diarrhea for past month.  Does not have much of an appetite, stating every time he eats his stomach hurts.  Also complains of a lot of gas.  WBC elevated.  Saw Dr. Humphrey Rolls who suggested he see Dr. Oliva Bustard.

## 2015-08-29 ENCOUNTER — Ambulatory Visit
Admission: RE | Admit: 2015-08-29 | Discharge: 2015-08-29 | Disposition: A | Discharge status: Home / Self Care | Payer: Commercial Managed Care - HMO | Source: Ambulatory Visit | Attending: Oncology | Admitting: Oncology

## 2015-08-29 DIAGNOSIS — C8590 Non-Hodgkin lymphoma, unspecified, unspecified site: Secondary | ICD-10-CM

## 2015-08-29 DIAGNOSIS — I714 Abdominal aortic aneurysm, without rupture: Secondary | ICD-10-CM | POA: Insufficient documentation

## 2015-08-29 DIAGNOSIS — R590 Localized enlarged lymph nodes: Secondary | ICD-10-CM

## 2015-08-29 DIAGNOSIS — R1900 Intra-abdominal and pelvic swelling, mass and lump, unspecified site: Secondary | ICD-10-CM

## 2015-08-29 LAB — GLUCOSE, CAPILLARY: Glucose-Capillary: 92 mg/dL (ref 65–99)

## 2015-08-29 MED ORDER — FLUDEOXYGLUCOSE F - 18 (FDG) INJECTION
11.2100 | Freq: Once | INTRAVENOUS | Status: DC | PRN
  Administered 2015-08-29: 11.21 via INTRAVENOUS
  Filled 2015-08-29: qty 11.21

## 2015-08-30 ENCOUNTER — Inpatient Hospital Stay (HOSPITAL_COMMUNITY): Payer: Commercial Managed Care - HMO | Admitting: Hematology and Oncology

## 2015-08-30 ENCOUNTER — Ambulatory Visit: Payer: Commercial Managed Care - HMO

## 2015-08-30 ENCOUNTER — Encounter: Payer: Self-pay | Admitting: Hematology and Oncology

## 2015-08-30 ENCOUNTER — Other Ambulatory Visit: Payer: Self-pay | Admitting: Internal Medicine

## 2015-08-30 ENCOUNTER — Telehealth: Payer: Self-pay

## 2015-08-30 ENCOUNTER — Other Ambulatory Visit: Payer: Self-pay | Admitting: Hematology and Oncology

## 2015-08-30 ENCOUNTER — Inpatient Hospital Stay
Admission: AD | Admit: 2015-08-30 | Discharge: 2015-09-04 | DRG: 394 | Disposition: A | Discharge status: Home / Self Care | Payer: Commercial Managed Care - HMO | Source: Ambulatory Visit | Attending: Hematology and Oncology | Admitting: Hematology and Oncology

## 2015-08-30 VITALS — BP 114/71 | HR 69 | Temp 97.9°F | Resp 18

## 2015-08-30 DIAGNOSIS — Z87891 Personal history of nicotine dependence: Secondary | ICD-10-CM

## 2015-08-30 DIAGNOSIS — K631 Perforation of intestine (nontraumatic): Secondary | ICD-10-CM

## 2015-08-30 DIAGNOSIS — R1902 Left upper quadrant abdominal swelling, mass and lump: Secondary | ICD-10-CM | POA: Diagnosis not present

## 2015-08-30 DIAGNOSIS — I255 Ischemic cardiomyopathy: Secondary | ICD-10-CM | POA: Diagnosis present

## 2015-08-30 DIAGNOSIS — D509 Iron deficiency anemia, unspecified: Secondary | ICD-10-CM

## 2015-08-30 DIAGNOSIS — D649 Anemia, unspecified: Secondary | ICD-10-CM | POA: Diagnosis present

## 2015-08-30 DIAGNOSIS — I110 Hypertensive heart disease with heart failure: Secondary | ICD-10-CM | POA: Diagnosis present

## 2015-08-30 DIAGNOSIS — E785 Hyperlipidemia, unspecified: Secondary | ICD-10-CM | POA: Diagnosis present

## 2015-08-30 DIAGNOSIS — I5022 Chronic systolic (congestive) heart failure: Secondary | ICD-10-CM | POA: Diagnosis present

## 2015-08-30 DIAGNOSIS — I252 Old myocardial infarction: Secondary | ICD-10-CM | POA: Diagnosis not present

## 2015-08-30 DIAGNOSIS — R970 Elevated carcinoembryonic antigen [CEA]: Secondary | ICD-10-CM | POA: Diagnosis not present

## 2015-08-30 DIAGNOSIS — C8200 Follicular lymphoma grade I, unspecified site: Secondary | ICD-10-CM | POA: Diagnosis present

## 2015-08-30 DIAGNOSIS — K5720 Diverticulitis of large intestine with perforation and abscess without bleeding: Secondary | ICD-10-CM

## 2015-08-30 DIAGNOSIS — I2510 Atherosclerotic heart disease of native coronary artery without angina pectoris: Secondary | ICD-10-CM | POA: Diagnosis present

## 2015-08-30 DIAGNOSIS — I714 Abdominal aortic aneurysm, without rupture: Secondary | ICD-10-CM | POA: Diagnosis present

## 2015-08-30 DIAGNOSIS — Z951 Presence of aortocoronary bypass graft: Secondary | ICD-10-CM

## 2015-08-30 DIAGNOSIS — R110 Nausea: Secondary | ICD-10-CM | POA: Diagnosis not present

## 2015-08-30 DIAGNOSIS — K6389 Other specified diseases of intestine: Secondary | ICD-10-CM | POA: Diagnosis present

## 2015-08-30 DIAGNOSIS — E876 Hypokalemia: Secondary | ICD-10-CM | POA: Diagnosis present

## 2015-08-30 DIAGNOSIS — C8590 Non-Hodgkin lymphoma, unspecified, unspecified site: Secondary | ICD-10-CM

## 2015-08-30 DIAGNOSIS — R197 Diarrhea, unspecified: Secondary | ICD-10-CM | POA: Diagnosis not present

## 2015-08-30 DIAGNOSIS — R1084 Generalized abdominal pain: Secondary | ICD-10-CM | POA: Diagnosis not present

## 2015-08-30 LAB — COMPREHENSIVE METABOLIC PANEL
ALK PHOS: 138 U/L — AB (ref 38–126)
ALT: 10 U/L — ABNORMAL LOW (ref 17–63)
ANION GAP: 9 (ref 5–15)
AST: 12 U/L — ABNORMAL LOW (ref 15–41)
Albumin: 2.2 g/dL — ABNORMAL LOW (ref 3.5–5.0)
BUN: 16 mg/dL (ref 6–20)
CALCIUM: 7.9 mg/dL — AB (ref 8.9–10.3)
CHLORIDE: 104 mmol/L (ref 101–111)
CO2: 23 mmol/L (ref 22–32)
Creatinine, Ser: 0.93 mg/dL (ref 0.61–1.24)
GFR calc non Af Amer: >60 mL/min (ref >60)
Glucose, Bld: 90 mg/dL (ref 65–99)
Potassium: 3.5 mmol/L (ref 3.5–5.1)
SODIUM: 136 mmol/L (ref 135–145)
Total Bilirubin: 1 mg/dL (ref 0.3–1.2)
Total Protein: 5 g/dL — ABNORMAL LOW (ref 6.5–8.1)

## 2015-08-30 LAB — PROTIME-INR
INR: 1.49
PROTHROMBIN TIME: 18.2 s — AB (ref 11.4–15.0)

## 2015-08-30 LAB — CBC WITH DIFFERENTIAL/PLATELET
Basophils Absolute: 0.1 10*3/uL (ref 0–0.1)
Basophils Relative: 1 %
EOS ABS: 0.7 10*3/uL (ref 0–0.7)
EOS PCT: 4 %
HCT: 27.9 % — ABNORMAL LOW (ref 40.0–52.0)
Hemoglobin: 8.9 g/dL — ABNORMAL LOW (ref 13.0–18.0)
LYMPHS ABS: 1.6 10*3/uL (ref 1.0–3.6)
Lymphocytes Relative: 8 %
MCH: 24.7 pg — AB (ref 26.0–34.0)
MCHC: 31.9 g/dL — AB (ref 32.0–36.0)
MCV: 77.6 fL — ABNORMAL LOW (ref 80.0–100.0)
MONO ABS: 1.3 10*3/uL — AB (ref 0.2–1.0)
MONOS PCT: 7 %
Neutro Abs: 16.1 10*3/uL — ABNORMAL HIGH (ref 1.4–6.5)
Neutrophils Relative %: 80 %
PLATELETS: 285 10*3/uL (ref 150–440)
RBC: 3.59 MIL/uL — ABNORMAL LOW (ref 4.40–5.90)
RDW: 19 % — ABNORMAL HIGH (ref 11.5–14.5)
WBC: 19.9 10*3/uL — ABNORMAL HIGH (ref 3.8–10.6)

## 2015-08-30 LAB — TYPE AND SCREEN
ABO/RH(D): O POS
Antibody Screen: NEGATIVE

## 2015-08-30 LAB — APTT: aPTT: 35 seconds (ref 24–36)

## 2015-08-30 LAB — ABO/RH: ABO/RH(D): O POS

## 2015-08-30 MED ORDER — SODIUM CHLORIDE 0.9 % IV SOLN
INTRAVENOUS | Status: DC
  Administered 2015-08-30 – 2015-08-31 (×3): via INTRAVENOUS

## 2015-08-30 MED ORDER — ALUM & MAG HYDROXIDE-SIMETH 200-200-20 MG/5ML PO SUSP: 60.0000 mL | ORAL | Status: DC | PRN

## 2015-08-30 MED ORDER — ONDANSETRON HCL 4 MG/2ML IJ SOLN: 4.0000 mg | Freq: Three times a day (TID) | INTRAMUSCULAR | Status: DC | PRN

## 2015-08-30 MED ORDER — HYDROCORTISONE 2.5 % RE CREA
1.0000 "application " | TOPICAL_CREAM | Freq: Two times a day (BID) | RECTAL | Status: DC | PRN
  Filled 2015-08-30: qty 28.35

## 2015-08-30 MED ORDER — GUAIFENESIN-DM 100-10 MG/5ML PO SYRP: 10.0000 mL | ORAL_SOLUTION | ORAL | Status: DC | PRN

## 2015-08-30 MED ORDER — PIPERACILLIN-TAZOBACTAM 3.375 G IVPB
3.3750 g | Freq: Three times a day (TID) | INTRAVENOUS | Status: AC
  Administered 2015-08-30 – 2015-09-02 (×11): 3.375 g via INTRAVENOUS
  Filled 2015-08-30 (×13): qty 50

## 2015-08-30 MED ORDER — ONDANSETRON 4 MG PO TBDP: 4.0000 mg | ORAL_TABLET | Freq: Three times a day (TID) | ORAL | Status: DC | PRN

## 2015-08-30 MED ORDER — SODIUM CHLORIDE 0.9 % IV SOLN
8.0000 mg | Freq: Three times a day (TID) | INTRAVENOUS | Status: DC | PRN
  Filled 2015-08-30: qty 4

## 2015-08-30 MED ORDER — SENNOSIDES-DOCUSATE SODIUM 8.6-50 MG PO TABS: 1.0000 | ORAL_TABLET | Freq: Every evening | ORAL | Status: DC | PRN

## 2015-08-30 MED ORDER — ONDANSETRON HCL 4 MG PO TABS: 4.0000 mg | ORAL_TABLET | Freq: Three times a day (TID) | ORAL | Status: DC | PRN

## 2015-08-30 NOTE — Telephone Encounter (Signed)
[facility] will be calling back to give NPI for [facility] referral after they see what Dr this patient will be seeing today.

## 2015-08-30 NOTE — H&P (Signed)
[facility]  Inpatient day: 08/30/2015  Chief Complaint: Craig Page is a 69 y.o. male with a history of stage IIIA follicular NHL who is admitted through the oncology clinic with a perforated colon.  HPI: The patient was diagnosed with stage IIIA follicular lymphoma. He completed maintenance Rituxan in 09/2011. PET scan in 02/2014 revealed a complete remission. By report PET scan in 10/2014, prior to open heart surgery, was negative.  The patient states for the past month he has felt "bad" with diarrhea. For the past 2 weeks he has not eaten well. He eats and has pain in his left upper quadrant. He notes that about 3 weeks ago he was working out in [employer] and "felt something pop". He denies any nausea, vomiting, melena or hematochezia.  He was recently seen by his cardiologist where "everything checked out". Labs performed by his cardiologist revealed an elevated white blood cell count as well as a decrease in hemoglobin and thus he was referred back to Dr. Oliva Bustard for evaluation.   He was seen by Dr. Oliva Bustard on 08/27/2015. He noted left upper quadrant pain, constant dull and aching. Blood pressure was low (80/54). Exam revealed mild abdominal distention and a large spleen 6-7 cm below the costal margin. Labs included a hematocrit 29.5, hemoglobin 9.4, MCV 77.1, platelets 310,000, white count 22,300 with an ANC of 17,500. Creatinine was 1.25. His clinical picture suggested recurrent low-grade lymphoma. He was scheduled for PET scan.  PET scan on 08/29/2015 revealed 10 cm left upper quadrant peripheral hypermetabolic mass with central stool and necrotic debris at the splenic flexure. Findings were suspicious for perforated colon cancer. There was hypermetabolic peritoneal soft tissue nodules in the abdomen and pelvis and minimal ascites and consistent with peritoneal metastatic disease. There was moderate hypermetabolic mesenteric and retroperitoneal adenopathy. He  has a stable 4 cm and renal abdominal aortic aneurysm.  Patient was contacted for immediate evaluation in the oncology clinic and admission to the hospital. Symptomatically, he denies any fevers or sweats, but notes feeling "freezing". He has lost about 40 pounds in the past year. He has never had a colonoscopy. He denies any melena or hematochezia.  Past Medical History  Diagnosis Date  . CAD (coronary artery disease)     3v  . Ischemic cardiomyopathy   . Chronic systolic heart failure   . HTN (hypertension)   . HLD (hyperlipidemia)   . Follicular lymphoma   . Myocardial infarction 1992    treated with thrombolytics/notes 10/16/2014    Past Surgical History  Procedure Laterality Date  . Hernia repair    . Umbilical hernia repair  1964  . Coronary artery bypass graft N/A 10/19/2014    Procedure: CORONARY ARTERY BYPASS GRAFTING (CABG); Surgeon: Ivin Poot, MD; Location: [place]; Service: Open Heart Surgery; Laterality: N/A; Times 3 using left internal mammary artery and endoscopically harvested left saphenous vein  . Intraoperative transesophageal echocardiogram N/A 10/19/2014    Procedure: INTRAOPERATIVE TRANSESOPHAGEAL ECHOCARDIOGRAM; Surgeon: Ivin Poot, MD; Location: [place]; Service: Open Heart Surgery; Laterality: N/A;    Family History  Problem Relation Age of Onset  . Heart failure Mother     deceased  . Cirrhosis Father     deceased    Social History:  reports that he quit smoking about 11 months ago. His smoking use included Cigarettes. He has a 50 pack-year smoking history. He has never used smokeless tobacco. He reports that he does not drink alcohol or use illicit  drugs.  Allergies: Not on File  Review of Systems: GENERAL: Felt bad for a month. No fevers or sweats. Weight loss of 40 pounds in the past year. PERFORMANCE STATUS (ECOG): 1 HEENT: Runny nose x 20  years. No visual changes, sore throat, mouth sores or tenderness. Lungs: No shortness of breath or cough. No hemoptysis. Cardiac: No chest pain, palpitations, orthopnea, or PND. GI: Left upper quadrant pain comes and goes. Diarrhea x 1 month. Poor oral intake. No nausea or vomiting. No melena or hematochezia. GU: Urine dark. No urgency, frequency, dysuria, or hematuria. Musculoskeletal: No back pain. No joint pain. No muscle tenderness. Extremities: No pain or swelling. Skin: No rashes or skin changes. Neuro: No headache, numbness or weakness, balance or coordination issues. Endocrine: No diabetes, thyroid issues, hot flashes or night sweats. Psych: No mood changes, depression or anxiety. Pain: No focal pain. Review of systems: All other systems reviewed and found to be negative.  Physical Exam:  Blood pressure 114/71, pulse 69, temperature 97.9 F (36.6 C), temperature source Tympanic, resp. rate 18, SpO2 98 %.  GENERAL: Well developed, well nourished, sitting comfortably on the medical unit in no acute distress. MENTAL STATUS: Alert and oriented to person, place and time. HEAD: Graying hair. Mustache. Normocephalic, atraumatic, face symmetric, no Cushingoid features. EYES: Pupils equal round and reactive to light and accomodation. No conjunctivitis or scleral icterus. ENT: Oropharynx clear without lesion. Dentures. Tongue normal. Mucous membranes moist.  RESPIRATORY: Clear to auscultation without rales, wheezes or rhonchi. CARDIOVASCULAR: Irregularly irregular rhythm without murmur, rub or gallop. ABDOMEN: Soft, tender in the left upper quadrant without guarding or rebound tenderness. Active bowel sounds. No hepatosplenomegaly. Left upper quadrant mass 6 cm below the costal margin and 3 cm above the umbilicus.  SKIN: No rashes, ulcers or lesions. EXTREMITIES: No edema, no skin discoloration or tenderness. No palpable cords. LYMPH NODES: No palpable  cervical, supraclavicular, axillary or inguinal adenopathy  NEUROLOGICAL: Unremarkable. PSYCH: Appropriate.   Lab Results Last 48 Hours    Results for orders placed or performed during the hospital encounter of 08/29/15 (from the past 48 hour(s))  Glucose, capillary Status: None   Collection Time: 08/29/15 12:20 PM  Result Value Ref Range   Glucose-Capillary 92 65 - 99 mg/dL      Imaging Results (Last 48 hours)    Nm Pet Image Restag (ps) Skull Base To Thigh  08/29/2015 CLINICAL DATA: Subsequent treatment strategy for lymphoma. Abdominal pain, diarrhea, leukocytosis, and splenomegaly. EXAM: NUCLEAR MEDICINE PET SKULL BASE TO THIGH TECHNIQUE: 11.2 mCi F-18 FDG was injected intravenously. Full-ring PET imaging was performed from the skull base to thigh after the radiotracer. CT data was obtained and used for attenuation correction and anatomic localization. FASTING BLOOD GLUCOSE: Value: 92 mg/dl COMPARISON: Chest CT on 10/17/2014 and PET-CT on 02/12/2014 FINDINGS: NECK No hypermetabolic lymph nodes in the neck. CHEST A 6 mm hypermetabolic mediastinal lymph node is seen just posterior to the lower thoracic esophagus on image 89 of series 3, which is hypermetabolic with SUV max of 7.1. This is new since previous study. No other hypermetabolic lymph nodes identified within the thorax. No suspicious pulmonary nodules seen on CT. Emphysema noted. ABDOMEN/PELVIS Small hypermetabolic soft tissue nodules are seen along the capsular surface of the liver and spleen. Largest is seen along the anterior aspect of the left hepatic lobe measuring 1.6 cm on image 121 of series 3, with SUV max of 8.2. There is also a tiny hypermetabolic focus in the left pelvic cul-de-sac  which has an SUV max of 5.0 on image 63 of series 4. Trace amount of ascites is seen. These findings are highly suspicious with peritoneal carcinoma. A 10.2 cm mass is seen in the left upper quadrant which shows a thick  hypermetabolic rim and stool or other particular debris centrally. This involves the splenic flexure of the colon and is new since previous study. SUV max measures 26.5. This is suspicious for a perforated colon carcinoma. Hypermetabolic lymphadenopathy is seen in the central abdominal mesentery medial to this mass, which measures 2.7 cm on image 145/series 3 with SUV max of 9.6. There is also a 8 mm hypermetabolic retroperitoneal lymph node in the aortocaval space on image 151/series 3, which has SUV max of 14.0. No hypermetabolic lymphadenopathy seen within the pelvis. 4.0 cm infrarenal abdominal aortic aneurysm is again seen, without significant change. SKELETON No focal hypermetabolic activity to suggest skeletal metastasis. IMPRESSION: 10 cm left upper quadrant peripherally hypermetabolic mass with central stool or necrotic debris which involves the splenic flexure the colon. This is suspicious for perforated colon carcinoma. Hypermetabolic peritoneal soft tissue nodules in the abdomen and pelvis and minimal ascites, consistent with peritoneal metastatic disease. Mild hypermetabolic mesenteric and retroperitoneal lymphadenopathy, consistent with metastatic disease. 6 mm hypermetabolic mediastinal lymph node just posterior to the lower thoracic esophagus. Metastatic disease cannot be excluded. Stable 4.0 cm infrarenal abdominal aortic aneurysm. Electronically Signed By: Earle Gell M.D. On: 08/29/2015 14:51     Assessment: The patient is a 69 y.o. gentleman with a history of stage IIA follicular lymphoma admitted with perforated colon. PET scan revealed a 10 cm left upper quadrant peripheral hypermetabolic mass with central stool and necrotic debris at the splenic flexure suspicious for perforated colon cancer. There were hypermetabolic peritoneal soft tissue nodules in the abdomen and pelvis and minimal ascites and consistent with peritoneal metastatic disease. There was moderate  hypermetabolic mesenteric and retroperitoneal adenopathy. He has never had a colonoscopy.  Symptomatically, he has not felt good for a month. He has lost 40# in the past year. Oral intake has been poor. He has had diarrhea. Exam reveals a large LUQ mass. He has an irregular rhythm. Blood pressure is normal. Pain is intermittent.  Plan:  1. Admit to [facility] on surgical floor with central monitoring. 2. NPO with IVF. 3. EKG- assess irregular rhythm 4. Labs: CBC with diff, CMP, PT, PTT, CEA, type and screen. 5. Consult surgery (Dr Jamal Collin)- called by Dr. Oliva Bustard. 6. Consult hospitalist service- medical management. 7. Pain medications and anti-emetics prn.   Lequita Asal, MD  08/30/2015, 12:12 PM

## 2015-08-30 NOTE — Progress Notes (Signed)
[facility]  Inpatient day:  08/30/2015  Chief Complaint: Craig Page is a 69 y.o. male with a history of stage IIIA follicular NHL who is admitted through the oncology clinic with a perforated colon.  HPI: The patient was diagnosed with stage IIIA follicular lymphoma.  He completed maintenance Rituxan in 09/2011.  PET scan in 02/2014 revealed a complete remission.  By report PET scan in 10/2014, prior to open heart surgery, was negative.  The patient states for the past month he has felt "bad" with diarrhea.  For the past 2 weeks he has not eaten well.  He eats and has pain in his left upper quadrant. He notes that about 3 weeks ago he was working out in [employer] and "felt something pop". He denies any nausea, vomiting, melena or hematochezia.  He was recently seen by his cardiologist where "everything checked out".  Labs performed by his cardiologist revealed an elevated white blood cell count as well as a decrease in hemoglobin and thus he was referred back to Dr. Oliva Bustard for evaluation.    He was seen by Dr. Oliva Bustard on 08/27/2015.  He noted left upper quadrant pain, constant dull and aching. Blood pressure was low (80/54).  Exam revealed mild abdominal distention and a large spleen 6-7 cm below the costal margin.  Labs included a hematocrit 29.5, hemoglobin 9.4, MCV 77.1, platelets 310,000, white count 22,300 with an ANC of 17,500. Creatinine was 1.25.  His clinical picture suggested recurrent low-grade lymphoma. He was scheduled for PET scan.  PET scan on 08/29/2015 revealed 10 cm left upper quadrant peripheral hypermetabolic mass with central stool and necrotic debris at the splenic flexure. Findings were suspicious for perforated colon cancer. There was hypermetabolic peritoneal soft tissue nodules in the abdomen and pelvis and minimal ascites and consistent with peritoneal metastatic disease. There was moderate hypermetabolic mesenteric and retroperitoneal adenopathy.  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  Patient was contacted for immediate evaluation in the oncology clinic and admission to the hospital. Symptomatically, he denies any fevers or sweats, but notes feeling "freezing". He has lost about 40 pounds in the past year. He has never had a colonoscopy.  He denies any melena or hematochezia.  Past Medical History  Diagnosis Date  . CAD (coronary artery disease)     3v  . Ischemic cardiomyopathy   . Chronic systolic heart failure   . HTN (hypertension)   . HLD (hyperlipidemia)   . Follicular lymphoma   . Myocardial infarction 1992    treated with thrombolytics/notes 10/16/2014    Past Surgical History  Procedure Laterality Date  . Hernia repair    . Umbilical hernia repair  1964  . Coronary artery bypass graft N/A 10/19/2014    Procedure: CORONARY ARTERY BYPASS GRAFTING (CABG);  Surgeon: Ivin Poot, MD;  Location: [place];  Service: Open Heart Surgery;  Laterality: N/A;  Times 3 using left internal mammary artery and endoscopically harvested left saphenous vein  . Intraoperative transesophageal echocardiogram N/A 10/19/2014    Procedure: INTRAOPERATIVE TRANSESOPHAGEAL ECHOCARDIOGRAM;  Surgeon: Ivin Poot, MD;  Location: [place];  Service: Open Heart Surgery;  Laterality: N/A;    Family History  Problem Relation Age of Onset  . Heart failure Mother     deceased  . Cirrhosis Father     deceased    Social History:  reports that he quit smoking about 11 months ago. His smoking use included Cigarettes. He  has a 50 pack-year smoking history. He has never used smokeless tobacco. He reports that he does not drink alcohol or use illicit drugs.  Allergies: Not on File   (Not in a hospital admission)  Review of Systems: GENERAL:  Felt bad for a month.  No fevers or sweats.  Weight loss of 40 pounds in the past year. PERFORMANCE STATUS (ECOG):  1 HEENT:  Runny nose x 20 years.  No visual changes, sore throat, mouth sores or  tenderness. Lungs: No shortness of breath or cough.  No hemoptysis. Cardiac:  No chest pain, palpitations, orthopnea, or PND. GI:  Left upper quadrant pain comes and goes.  Diarrhea x 1 month.  Poor oral intake.  No nausea or vomiting.  No melena or hematochezia. GU:  Urine dark.  No urgency, frequency, dysuria, or hematuria. Musculoskeletal:  No back pain.  No joint pain.  No muscle tenderness. Extremities:  No pain or swelling. Skin:  No rashes or skin changes. Neuro:  No headache, numbness or weakness, balance or coordination issues. Endocrine:  No diabetes, thyroid issues, hot flashes or night sweats. Psych:  No mood changes, depression or anxiety. Pain:  No focal pain. Review of systems:  All other systems reviewed and found to be negative.   Physical Exam:  Blood pressure 114/71, pulse 69, temperature 97.9 F (36.6 C), temperature source Tympanic, resp. rate 18, SpO2 98 %.  GENERAL:  Well developed, well nourished, sitting comfortably on the medical unit in no acute distress. MENTAL STATUS:  Alert and oriented to person, place and time. HEAD:  Graying hair.  Mustache.  Normocephalic, atraumatic, face symmetric, no Cushingoid features. EYES:  Pupils equal round and reactive to light and accomodation.  No conjunctivitis or scleral icterus. ENT:  Oropharynx clear without lesion.  Dentures.  Tongue normal. Mucous membranes moist.  RESPIRATORY:  Clear to auscultation without rales, wheezes or rhonchi. CARDIOVASCULAR:  Irregularly irregular rhythm without murmur, rub or gallop. ABDOMEN:  Soft, tender in the left upper quadrant without guarding or rebound tenderness.  Active bowel sounds.  No hepatosplenomegaly.  Left upper quadrant mass 6 cm below the costal margin and 3 cm above the umbilicus.  SKIN:  No rashes, ulcers or lesions. EXTREMITIES: No edema, no skin discoloration or tenderness.  No palpable cords. LYMPH NODES: No palpable cervical, supraclavicular, axillary or inguinal  adenopathy  NEUROLOGICAL: Unremarkable. PSYCH:  Appropriate.  Results for orders placed or performed during the hospital encounter of 08/29/15 (from the past 48 hour(s))  Glucose, capillary     Status: None   Collection Time: 08/29/15 12:20 PM  Result Value Ref Range   Glucose-Capillary 92 65 - 99 mg/dL   Nm Pet Image Restag (ps) Skull Base To Thigh  08/29/2015   CLINICAL DATA:  Subsequent treatment strategy for lymphoma. Abdominal pain, diarrhea, leukocytosis, and splenomegaly.  EXAM: NUCLEAR MEDICINE PET SKULL BASE TO THIGH  TECHNIQUE: 11.2 mCi F-18 FDG was injected intravenously. Full-ring PET imaging was performed from the skull base to thigh after the radiotracer. CT data was obtained and used for attenuation correction and anatomic localization.  FASTING BLOOD GLUCOSE:  Value: 92 mg/dl  COMPARISON:  Chest CT on 10/17/2014 and PET-CT on 02/12/2014  FINDINGS: NECK  No hypermetabolic lymph nodes in the neck.  CHEST  A 6 mm hypermetabolic mediastinal lymph node is seen just posterior to the lower thoracic esophagus on image 89 of series 3, which is hypermetabolic with SUV max of 7.1. This is new since previous study. No  other hypermetabolic lymph nodes identified within the thorax. No suspicious pulmonary nodules seen on CT. Emphysema noted.  ABDOMEN/PELVIS  Small hypermetabolic soft tissue nodules are seen along the capsular surface of the liver and spleen. Largest is seen along the anterior aspect of the left hepatic lobe measuring 1.6 cm on image 121 of series 3, with SUV max of 8.2. There is also a tiny hypermetabolic focus in the left pelvic cul-de-sac which has an SUV max of 5.0 on image 63 of series 4. Trace amount of ascites is seen. These findings are highly suspicious with peritoneal carcinoma.  A 10.2 cm mass is seen in the left upper quadrant which shows a thick hypermetabolic rim and stool or other particular debris centrally. This involves the splenic flexure of the colon and is new since  previous study. SUV max measures 26.5. This is suspicious for a perforated colon carcinoma.  Hypermetabolic lymphadenopathy is seen in the central abdominal mesentery medial to this mass, which measures 2.7 cm on image 145/series 3 with SUV max of 9.6. There is also a 8 mm hypermetabolic retroperitoneal lymph node in the aortocaval space on image 151/series 3, which has SUV max of 14.0. No hypermetabolic lymphadenopathy seen within the pelvis.  4.0 cm infrarenal abdominal aortic aneurysm is again seen, without significant change.  SKELETON  No focal hypermetabolic activity to suggest skeletal metastasis.  IMPRESSION: 10 cm left upper quadrant peripherally hypermetabolic mass with central stool or necrotic debris which involves the splenic flexure the colon. This is suspicious for perforated colon carcinoma.  Hypermetabolic peritoneal soft tissue nodules in the abdomen and pelvis and minimal ascites, consistent with peritoneal metastatic disease.  Mild hypermetabolic mesenteric and retroperitoneal lymphadenopathy, consistent with metastatic disease.  6 mm hypermetabolic mediastinal lymph node just posterior to the lower thoracic esophagus. Metastatic disease cannot be excluded.  Stable 4.0 cm infrarenal abdominal aortic aneurysm.   Electronically Signed   By: Earle Gell M.D.   On: 08/29/2015 14:51    Assessment:  The patient is a 69 y.o. gentleman with a history of stage IIA follicular lymphoma admitted with perforated colon.  PET scan revealed a 10 cm left upper quadrant peripheral hypermetabolic mass with central stool and necrotic debris at the splenic flexure suspicious for perforated colon cancer. There were hypermetabolic peritoneal soft tissue nodules in the abdomen and pelvis and minimal ascites and consistent with peritoneal metastatic disease. There was moderate hypermetabolic mesenteric and retroperitoneal adenopathy.  He has never had a colonoscopy.  Symptomatically, he has not felt good for a  month.  He has lost 40# in the past year.  Oral intake has been poor.  He has had diarrhea.  Exam reveals a large LUQ mass.  He has an irregular rhythm.  Blood pressure is normal.  Pain is intermittent.  Plan:   1.  Admit to [facility] on surgical floor with central monitoring. 2.  NPO with IVF. 3.  EKG-  assess irregular rhythm 4.  Labs:  CBC with diff, CMP, PT, PTT, CEA, type and screen. 5.  Consult surgery (Dr Jamal Collin)- called by Dr. Oliva Bustard. 6.  Consult hospitalist service- medical management. 7.  Pain medications and anti-emetics prn.   Lequita Asal, MD  08/30/2015, 12:12 PM

## 2015-08-30 NOTE — Consult Note (Signed)
Reason for Consult: Abdominal pain and suspected the colonic perforation Referring Physician: Dr. Forest Gleason  Craig Page is an 69 y.o. male.  HPI: This is a 69 year old male who has a previous history of lymphoma treated with outer toxin in 2012- he has been information since that time. About a month ago the patient started having some loose stools and states he normally used to go once a day and now about 2-3 times a day. No blood in his stool. About 2-3 weeks ago while he was doing some exercise he felt something pop in the left upper abdominal area and since that time hasn't been having pain in the region. His appetite is a moderately infected and he has not been eating [place] much ever since he's been having the diarrhea and the abdominal pain. Feels like he has lost a little bit of weight during this time. He denies any fever or chills.   Past Medical History  Diagnosis Date  . CAD (coronary artery disease)     3v  . Ischemic cardiomyopathy   . Chronic systolic heart failure   . HTN (hypertension)   . HLD (hyperlipidemia)   . Follicular lymphoma   . Myocardial infarction 1992    treated with thrombolytics/notes 10/16/2014    Past Surgical History  Procedure Laterality Date  . Hernia repair    . Umbilical hernia repair  1964  . Coronary artery bypass graft N/A 10/19/2014    Procedure: CORONARY ARTERY BYPASS GRAFTING (CABG);  Surgeon: Ivin Poot, MD;  Location: [place];  Service: Open Heart Surgery;  Laterality: N/A;  Times 3 using left internal mammary artery and endoscopically harvested left saphenous vein  . Intraoperative transesophageal echocardiogram N/A 10/19/2014    Procedure: INTRAOPERATIVE TRANSESOPHAGEAL ECHOCARDIOGRAM;  Surgeon: Ivin Poot, MD;  Location: [place];  Service: Open Heart Surgery;  Laterality: N/A;    Family History  Problem Relation Age of Onset  . Heart failure Mother     deceased  . Cirrhosis Father     deceased    Social History:  reports that  he quit smoking about 11 months ago. His smoking use included Cigarettes. He has a 50 pack-year smoking history. He has never used smokeless tobacco. He reports that he does not drink alcohol or use illicit drugs.  Allergies: No Known Allergies  Medications: I have reviewed the patient's current medications.  Results for orders placed or performed during the hospital encounter of 08/30/15 (from the past 48 hour(s))  APTT     Status: None   Collection Time: 08/30/15  2:50 PM  Result Value Ref Range   aPTT 35 24 - 36 seconds  CBC WITH DIFFERENTIAL     Status: Abnormal   Collection Time: 08/30/15  2:50 PM  Result Value Ref Range   WBC 19.9 (H) 3.8 - 10.6 K/uL   RBC 3.59 (L) 4.40 - 5.90 MIL/uL   Hemoglobin 8.9 (L) 13.0 - 18.0 g/dL   HCT 27.9 (L) 40.0 - 52.0 %   MCV 77.6 (L) 80.0 - 100.0 fL   MCH 24.7 (L) 26.0 - 34.0 pg   MCHC 31.9 (L) 32.0 - 36.0 g/dL   RDW 19.0 (H) 11.5 - 14.5 %   Platelets 285 150 - 440 K/uL   Neutrophils Relative % 80 %   Neutro Abs 16.1 (H) 1.4 - 6.5 K/uL   Lymphocytes Relative 8 %   Lymphs Abs 1.6 1.0 - 3.6 K/uL   Monocytes Relative 7 %  Monocytes Absolute 1.3 (H) 0.2 - 1.0 K/uL   Eosinophils Relative 4 %   Eosinophils Absolute 0.7 0 - 0.7 K/uL   Basophils Relative 1 %   Basophils Absolute 0.1 0 - 0.1 K/uL  Comprehensive metabolic panel     Status: Abnormal   Collection Time: 08/30/15  2:50 PM  Result Value Ref Range   Sodium 136 135 - 145 mmol/L   Potassium 3.5 3.5 - 5.1 mmol/L   Chloride 104 101 - 111 mmol/L   CO2 23 22 - 32 mmol/L   Glucose, Bld 90 65 - 99 mg/dL   BUN 16 6 - 20 mg/dL   Creatinine, Ser 0.93 0.61 - 1.24 mg/dL   Calcium 7.9 (L) 8.9 - 10.3 mg/dL   Total Protein 5.0 (L) 6.5 - 8.1 g/dL   Albumin 2.2 (L) 3.5 - 5.0 g/dL   AST 12 (L) 15 - 41 U/L   ALT 10 (L) 17 - 63 U/L   Alkaline Phosphatase 138 (H) 38 - 126 U/L   Total Bilirubin 1.0 0.3 - 1.2 mg/dL   GFR calc non Af Amer >60 >60 mL/min   GFR calc Af Amer >60 >60 mL/min    Comment:  (NOTE) The eGFR has been calculated using the CKD EPI equation. This calculation has not been validated in all clinical situations. eGFR's persistently <60 mL/min signify possible Chronic Kidney Disease.    Anion gap 9 5 - 15  Protime-INR     Status: Abnormal   Collection Time: 08/30/15  2:50 PM  Result Value Ref Range   Prothrombin Time 18.2 (H) 11.4 - 15.0 seconds   INR 1.49   Type and screen     Status: None   Collection Time: 08/30/15  2:50 PM  Result Value Ref Range   ABO/RH(D) O POS    Antibody Screen NEG    Sample Expiration 09/02/2015     Nm Pet Image Restag (ps) Skull Base To Thigh  08/29/2015   CLINICAL DATA:  Subsequent treatment strategy for lymphoma. Abdominal pain, diarrhea, leukocytosis, and splenomegaly.  EXAM: NUCLEAR MEDICINE PET SKULL BASE TO THIGH  TECHNIQUE: 11.2 mCi F-18 FDG was injected intravenously. Full-ring PET imaging was performed from the skull base to thigh after the radiotracer. CT data was obtained and used for attenuation correction and anatomic localization.  FASTING BLOOD GLUCOSE:  Value: 92 mg/dl  COMPARISON:  Chest CT on 10/17/2014 and PET-CT on 02/12/2014  FINDINGS: NECK  No hypermetabolic lymph nodes in the neck.  CHEST  A 6 mm hypermetabolic mediastinal lymph node is seen just posterior to the lower thoracic esophagus on image 89 of series 3, which is hypermetabolic with SUV max of 7.1. This is new since previous study. No other hypermetabolic lymph nodes identified within the thorax. No suspicious pulmonary nodules seen on CT. Emphysema noted.  ABDOMEN/PELVIS  Small hypermetabolic soft tissue nodules are seen along the capsular surface of the liver and spleen. Largest is seen along the anterior aspect of the left hepatic lobe measuring 1.6 cm on image 121 of series 3, with SUV max of 8.2. There is also a tiny hypermetabolic focus in the left pelvic cul-de-sac which has an SUV max of 5.0 on image 63 of series 4. Trace amount of ascites is seen. These  findings are highly suspicious with peritoneal carcinoma.  A 10.2 cm mass is seen in the left upper quadrant which shows a thick hypermetabolic rim and stool or other particular debris centrally. This involves the splenic flexure  of the colon and is new since previous study. SUV max measures 26.5. This is suspicious for a perforated colon carcinoma.  Hypermetabolic lymphadenopathy is seen in the central abdominal mesentery medial to this mass, which measures 2.7 cm on image 145/series 3 with SUV max of 9.6. There is also a 8 mm hypermetabolic retroperitoneal lymph node in the aortocaval space on image 151/series 3, which has SUV max of 14.0. No hypermetabolic lymphadenopathy seen within the pelvis.  4.0 cm infrarenal abdominal aortic aneurysm is again seen, without significant change.  SKELETON  No focal hypermetabolic activity to suggest skeletal metastasis.  IMPRESSION: 10 cm left upper quadrant peripherally hypermetabolic mass with central stool or necrotic debris which involves the splenic flexure the colon. This is suspicious for perforated colon carcinoma.  Hypermetabolic peritoneal soft tissue nodules in the abdomen and pelvis and minimal ascites, consistent with peritoneal metastatic disease.  Mild hypermetabolic mesenteric and retroperitoneal lymphadenopathy, consistent with metastatic disease.  6 mm hypermetabolic mediastinal lymph node just posterior to the lower thoracic esophagus. Metastatic disease cannot be excluded.  Stable 4.0 cm infrarenal abdominal aortic aneurysm.   Electronically Signed   By: Earle Gell M.D.   On: 08/29/2015 14:51    Review of Systems  Constitutional: Positive for weight loss and malaise/fatigue. Negative for fever and chills.  HENT: Negative.   Eyes: Negative.   Respiratory: Negative.   Cardiovascular: Positive for claudication.  Gastrointestinal: Positive for abdominal pain and diarrhea. Negative for nausea and vomiting.  Genitourinary: Negative.    Blood pressure  136/67, pulse 74, temperature 98.1 F (36.7 C), temperature source Oral, resp. rate 17, height $RemoveBe'5\' 11"'EHMMsCqcA$  (1.803 m), weight 164 lb (74.39 kg), SpO2 99 %. Physical Exam  Constitutional: He appears well-developed and well-nourished.  Eyes: Conjunctivae are normal. No scleral icterus.  Neck: Neck supple. No thyromegaly present.  Cardiovascular: Normal rate, regular rhythm and normal heart sounds.   Respiratory: Effort normal and breath sounds normal.  GI: Soft. There is no hepatomegaly. There is tenderness in the left upper quadrant. There is no rebound.    Lymphadenopathy:    He has no cervical adenopathy.    He has no axillary adenopathy.       Right: No inguinal adenopathy present.       Left: No inguinal adenopathy present.    Assessment/Plan: PET scan findings were reviewed. And it appears he has a contained perforation near the splenic flexure  of the colon. Cause a perforation is uncertain but there is suspicion that this may involve a colonic carcinoma since there are some PET pos nodules noted above the liver and above the spleen. There is some moderate enlargement of the spleen and some lymph nodes in the mesentery in the upper abdomen. At this point the patient does not appear to be toxic or having any features of peritonitis. Surgical intervention at this time made more than likely involve a colostomy. Since this process appears to be contained would favor trial of anabiotic's. There is no improvement in in 2-3 days consider surgical intervention then. Patient also has a small abdominal aortic cancers about 4 cm. This does not require intervention at this time and patient has been advised on this also. I agree with the use of Zosyn as the choice of anabiotic as already ordered. Would also recommend getting a CEA in addition to his other basic legs. I will be following this patient on a daily basis and will be available for any urgent situation.  SANKAR,SEEPLAPUTHUR G 08/30/2015,  4:21 PM

## 2015-08-30 NOTE — Progress Notes (Signed)
Report called to [facility] on [place]. Pt transferred to room 230 via wheelchair with orderly. Cardiology consult changed to [facility] instead of [place] [facility]. Spoke with Mardene Celeste at [facility] cardiology who will inform Nehemiah Massed to disregard consult. Dr. B spoke with Dr. Humphrey Rolls regarding cardiology consult. Dr. Oliva Bustard spoke with Dr. Jamal Collin this AM regarding surgical consult.

## 2015-08-30 NOTE — Consult Note (Signed)
ANTIBIOTIC CONSULT NOTE - INITIAL  Pharmacy Consult for zosyn Indication: bowel perforation  No Known Allergies  Patient Measurements: Height: 5\' 11"  (180.3 cm) Weight: 164 lb (74.39 kg) IBW/kg (Calculated) : 75.3 Adjusted Body Weight:   Vital Signs: Temp: 98.1 F (36.7 C) (09/23 1400) Temp Source: Oral (09/23 1400) BP: 136/67 mmHg (09/23 1400) Pulse Rate: 74 (09/23 1400) Intake/Output from previous day:   Intake/Output from this shift:    Labs:  Recent Labs  08/30/15 1450  WBC 19.9*  HGB 8.9*  PLT 285  CREATININE 0.93   Estimated Creatinine Clearance: 80 mL/min (by C-G formula based on Cr of 0.93). No results for input(s): VANCOTROUGH, VANCOPEAK, VANCORANDOM, GENTTROUGH, GENTPEAK, GENTRANDOM, TOBRATROUGH, TOBRAPEAK, TOBRARND, AMIKACINPEAK, AMIKACINTROU, AMIKACIN in the last 72 hours.   Microbiology: No results found for this or any previous visit (from the past 720 hour(s)).  Medical History: Past Medical History  Diagnosis Date  . CAD (coronary artery disease)     3v  . Ischemic cardiomyopathy   . Chronic systolic heart failure   . HTN (hypertension)   . HLD (hyperlipidemia)   . Follicular lymphoma   . Myocardial infarction 1992    treated with thrombolytics/notes 10/16/2014    Medications:  Scheduled:   Assessment: Pt is a 69 year old male admitted with a perforated colon. Pharmacy consulted to dose zosyn  Goal of Therapy:    Plan:  will give zosyn 3.375g q 8 hours ei infusion  Pharmacy to continue to monitor  Ramond Dial 08/30/2015,4:16 PM

## 2015-08-30 NOTE — Consult Note (Signed)
Primary Cardiologist: Dr. Laurelyn Sickle    Reason for Consultation : Pre-operative cardiac clearance   HPI : This is 70yo male well known to our practice with PMHx CAD s/p CABG, systolic CHF who presents to with newly dx 10 cm LUQ mass. He denies CP, SOB, palpitations, orthopnea or PND.         Review of Systems: General: negative for chills, fever, night sweats or weight changes.  Cardiovascular: negative for chest pain, edema, orthopnea, palpitations, paroxysmal nocturnal dyspnea, shortness of breath or dyspnea on exertion Dermatological: negative for rash Respiratory: negative for cough or wheezing Urologic: negative for hematuria Abdominal: negative for nausea, vomiting, diarrhea, bright red blood per rectum, melena, or hematemesis Neurologic: negative for visual changes, syncope, or dizziness All other systems reviewed and are otherwise negative except as noted above.    Past Medical History  Diagnosis Date  . CAD (coronary artery disease)     3v  . Ischemic cardiomyopathy   . Chronic systolic heart failure   . HTN (hypertension)   . HLD (hyperlipidemia)   . Follicular lymphoma   . Myocardial infarction 1992    treated with thrombolytics/notes 10/16/2014    Medications Prior to Admission  Medication Sig Dispense Refill  . carvedilol (COREG) 25 MG tablet Take 25 mg by mouth 2 (two) times daily.    . carvedilol (COREG) 3.125 MG tablet TAKE 1 TABLET BY MOUTH TWICE DAILY. 180 tablet 4  . lisinopril (PRINIVIL,ZESTRIL) 2.5 MG tablet TAKE 1 TABLET BY MOUTH TWICE DAILY 180 tablet 0  . pantoprazole (PROTONIX) 40 MG tablet Take 40 mg by mouth daily.    . simethicone (MYLICON) 80 MG chewable tablet Chew 80 mg by mouth 3 (three) times daily as needed for flatulence.    Marland Kitchen amiodarone (PACERONE) 200 MG tablet Take 1 tablet (200 mg total) by mouth daily. (Patient not taking: Reported on 08/30/2015) 30 tablet 1  . spironolactone (ALDACTONE) 25 MG tablet Take 0.5 tablets (12.5 mg total)  by mouth daily. (Patient not taking: Reported on 08/30/2015) 45 tablet 3  . [DISCONTINUED] atorvastatin (LIPITOR) 40 MG tablet Take 1 tablet (40 mg total) by mouth daily. 30 tablet 1        Infusions: . sodium chloride      No Known Allergies  Social History   Social History  . Marital Status: Married    Spouse Name: N/A  . Number of Children: N/A  . Years of Education: N/A   Occupational History  . Not on file.   Social History Main Topics  . Smoking status: Former Smoker -- 1.00 packs/day for 50 years    Types: Cigarettes    Quit date: 09/24/2014  . Smokeless tobacco: Never Used  . Alcohol Use: No  . Drug Use: No  . Sexual Activity: Not Currently   Other Topics Concern  . Not on file   Social History Narrative    Family History  Problem Relation Age of Onset  . Heart failure Mother     deceased  . Cirrhosis Father     deceased    PHYSICAL EXAM: Filed Vitals:   08/30/15 1400  BP: 136/67  Pulse: 74  Temp: 98.1 F (36.7 C)  Resp: 17    No intake or output data in the 24 hours ending 08/30/15 1456  General:  Well appearing. No respiratory difficulty HEENT: normal Neck: supple. no JVD. Carotids 2+ bilat; no bruits. No lymphadenopathy or thryomegaly appreciated. Cor: PMI nondisplaced. Regular rate &  rhythm. No rubs, gallops or murmurs. Lungs: clear Abdomen: soft, nontender, nondistended. No hepatosplenomegaly. No bruits or masses. Good bowel sounds. Extremities: no cyanosis, clubbing, rash, edema Neuro: alert & oriented x 3, cranial nerves grossly intact. moves all 4 extremities w/o difficulty. Affect pleasant.  ECG:  No results found for this or any previous visit (from the past 24 hour(s)). Nm Pet Image Restag (ps) Skull Base To Thigh  08/29/2015   CLINICAL DATA:  Subsequent treatment strategy for lymphoma. Abdominal pain, diarrhea, leukocytosis, and splenomegaly.  EXAM: NUCLEAR MEDICINE PET SKULL BASE TO THIGH  TECHNIQUE: 11.2 mCi F-18 FDG was  injected intravenously. Full-ring PET imaging was performed from the skull base to thigh after the radiotracer. CT data was obtained and used for attenuation correction and anatomic localization.  FASTING BLOOD GLUCOSE:  Value: 92 mg/dl  COMPARISON:  Chest CT on 10/17/2014 and PET-CT on 02/12/2014  FINDINGS: NECK  No hypermetabolic lymph nodes in the neck.  CHEST  A 6 mm hypermetabolic mediastinal lymph node is seen just posterior to the lower thoracic esophagus on image 89 of series 3, which is hypermetabolic with SUV max of 7.1. This is new since previous study. No other hypermetabolic lymph nodes identified within the thorax. No suspicious pulmonary nodules seen on CT. Emphysema noted.  ABDOMEN/PELVIS  Small hypermetabolic soft tissue nodules are seen along the capsular surface of the liver and spleen. Largest is seen along the anterior aspect of the left hepatic lobe measuring 1.6 cm on image 121 of series 3, with SUV max of 8.2. There is also a tiny hypermetabolic focus in the left pelvic cul-de-sac which has an SUV max of 5.0 on image 63 of series 4. Trace amount of ascites is seen. These findings are highly suspicious with peritoneal carcinoma.  A 10.2 cm mass is seen in the left upper quadrant which shows a thick hypermetabolic rim and stool or other particular debris centrally. This involves the splenic flexure of the colon and is new since previous study. SUV max measures 26.5. This is suspicious for a perforated colon carcinoma.  Hypermetabolic lymphadenopathy is seen in the central abdominal mesentery medial to this mass, which measures 2.7 cm on image 145/series 3 with SUV max of 9.6. There is also a 8 mm hypermetabolic retroperitoneal lymph node in the aortocaval space on image 151/series 3, which has SUV max of 14.0. No hypermetabolic lymphadenopathy seen within the pelvis.  4.0 cm infrarenal abdominal aortic aneurysm is again seen, without significant change.  SKELETON  No focal hypermetabolic  activity to suggest skeletal metastasis.  IMPRESSION: 10 cm left upper quadrant peripherally hypermetabolic mass with central stool or necrotic debris which involves the splenic flexure the colon. This is suspicious for perforated colon carcinoma.  Hypermetabolic peritoneal soft tissue nodules in the abdomen and pelvis and minimal ascites, consistent with peritoneal metastatic disease.  Mild hypermetabolic mesenteric and retroperitoneal lymphadenopathy, consistent with metastatic disease.  6 mm hypermetabolic mediastinal lymph node just posterior to the lower thoracic esophagus. Metastatic disease cannot be excluded.  Stable 4.0 cm infrarenal abdominal aortic aneurysm.   Electronically Signed   By: Earle Gell M.D.   On: 08/29/2015 14:51     ASSESSMENT: Hx of CAD s/p CABG, systolic CHF, EF improved s/p CABG surgery   PLAN/DISCUSSION: Proceed with surgery, as there is no other option. Pt has PMHx of LV dysfunction with LVEF of 49% in May 2016 at our office. However, clinically pt is not in heart failure. Thus is low-mod risk for  cardiac complications.    Patient and plan discussed with supervising provider, Dr. Neoma Laming, who agrees with above findings.   Kelby Fam [place], [place] 08/30/2015 2:56 PM

## 2015-08-30 NOTE — Progress Notes (Signed)
Patient here for admission for bowel perforation. Acute Add on. Reports Nausea for the last three days.  He has not eaten or drank anything in 3 days, except a sip of water for routine medications this morning. Patient described abdominal discomfort and pressure.

## 2015-08-31 DIAGNOSIS — R110 Nausea: Secondary | ICD-10-CM

## 2015-08-31 DIAGNOSIS — R197 Diarrhea, unspecified: Secondary | ICD-10-CM

## 2015-08-31 DIAGNOSIS — R9431 Abnormal electrocardiogram [ECG] [EKG]: Secondary | ICD-10-CM

## 2015-08-31 DIAGNOSIS — K631 Perforation of intestine (nontraumatic): Principal | ICD-10-CM

## 2015-08-31 DIAGNOSIS — R1084 Generalized abdominal pain: Secondary | ICD-10-CM

## 2015-08-31 DIAGNOSIS — Z8572 Personal history of non-Hodgkin lymphomas: Secondary | ICD-10-CM

## 2015-08-31 LAB — BASIC METABOLIC PANEL
ANION GAP: 4 — AB (ref 5–15)
BUN: 16 mg/dL (ref 6–20)
CO2: 24 mmol/L (ref 22–32)
Calcium: 7.6 mg/dL — ABNORMAL LOW (ref 8.9–10.3)
Chloride: 111 mmol/L (ref 101–111)
Creatinine, Ser: 1 mg/dL (ref 0.61–1.24)
GLUCOSE: 93 mg/dL (ref 65–99)
POTASSIUM: 3.4 mmol/L — AB (ref 3.5–5.1)
Sodium: 139 mmol/L (ref 135–145)

## 2015-08-31 LAB — CEA: CEA: 16.9 ng/mL — AB (ref 0.0–4.7)

## 2015-08-31 LAB — CBC WITH DIFFERENTIAL/PLATELET
BASOS ABS: 0.1 10*3/uL (ref 0–0.1)
Basophils Relative: 1 %
Eosinophils Absolute: 0.7 10*3/uL (ref 0–0.7)
Eosinophils Relative: 4 %
HEMATOCRIT: 25.6 % — AB (ref 40.0–52.0)
HEMOGLOBIN: 8.1 g/dL — AB (ref 13.0–18.0)
LYMPHS PCT: 10 %
Lymphs Abs: 1.5 10*3/uL (ref 1.0–3.6)
MCH: 24.7 pg — ABNORMAL LOW (ref 26.0–34.0)
MCHC: 31.7 g/dL — ABNORMAL LOW (ref 32.0–36.0)
MCV: 77.9 fL — AB (ref 80.0–100.0)
MONO ABS: 0.9 10*3/uL (ref 0.2–1.0)
Monocytes Relative: 6 %
NEUTROS ABS: 12.8 10*3/uL — AB (ref 1.4–6.5)
NEUTROS PCT: 79 %
Platelets: 246 10*3/uL (ref 150–440)
RBC: 3.28 MIL/uL — AB (ref 4.40–5.90)
RDW: 19.1 % — AB (ref 11.5–14.5)
WBC: 16.1 10*3/uL — ABNORMAL HIGH (ref 3.8–10.6)

## 2015-08-31 MED ORDER — DEXTROSE-NACL 5-0.45 % IV SOLN
INTRAVENOUS | Status: DC
  Administered 2015-08-31 – 2015-09-01 (×3): via INTRAVENOUS

## 2015-08-31 NOTE — Progress Notes (Signed)
[place]  [phone] [phone]  ID: Craig Page OB: 1946-03-26  MR#: [id number]  RCV#:[id number]  Patient Care Team: Arlis Porta., MD as PCP - General (Family Medicine)  CHIEF COMPLAINT: Contained perforation at the splenic flexure, likely secondary to malignancy.  INTERVAL HISTORY: Patient lying in bed resting comfortably. States his abdominal pain is improved, but still mildly evident. He continues to have increased diarrhea. He also states he has nausea even with just sips of water. He denies any fevers. He offers no further specific complaints.  REVIEW OF SYSTEMS:   Review of Systems  Constitutional: Negative for fever.  Respiratory: Negative.   Cardiovascular: Negative.   Gastrointestinal: Positive for nausea, abdominal pain and diarrhea.    As per HPI. Otherwise, a complete review of systems is negatve.  PAST MEDICAL HISTORY: Past Medical History  Diagnosis Date  . CAD (coronary artery disease)     3v  . Ischemic cardiomyopathy   . Chronic systolic heart failure   . HTN (hypertension)   . HLD (hyperlipidemia)   . Follicular lymphoma   . Myocardial infarction 1992    treated with thrombolytics/notes 10/16/2014    PAST SURGICAL HISTORY: Past Surgical History  Procedure Laterality Date  . Hernia repair    . Umbilical hernia repair  1964  . Coronary artery bypass graft N/A 10/19/2014    Procedure: CORONARY ARTERY BYPASS GRAFTING (CABG);  Surgeon: Ivin Poot, MD;  Location: [place];  Service: Open Heart Surgery;  Laterality: N/A;  Times 3 using left internal mammary artery and endoscopically harvested left saphenous vein  . Intraoperative transesophageal echocardiogram N/A 10/19/2014    Procedure: INTRAOPERATIVE TRANSESOPHAGEAL ECHOCARDIOGRAM;  Surgeon: Ivin Poot, MD;  Location: [facility];  Service: Open Heart Surgery;  Laterality: N/A;    FAMILY HISTORY Family History  Problem Relation Age of Onset  . Heart  failure Mother     deceased  . Cirrhosis Father     deceased       ADVANCED DIRECTIVES:    HEALTH MAINTENANCE: Social History  Substance Use Topics  . Smoking status: Former Smoker -- 1.00 packs/day for 50 years    Types: Cigarettes    Quit date: 09/24/2014  . Smokeless tobacco: Never Used  . Alcohol Use: No     Colonoscopy:  PAP:  Bone density:  Lipid panel:  No Known Allergies  Current Facility-Administered Medications  Medication Dose Route Frequency Provider Last Rate Last Dose  . dextrose 5 %-0.45 % sodium chloride infusion   Intravenous Continuous Christene Lye, MD 125 mL/hr at 08/31/15 1108    . guaiFENesin-dextromethorphan (ROBITUSSIN DM) 100-10 MG/5ML syrup 10 mL  10 mL Oral Q4H PRN Cammie Sickle, MD      . hydrocortisone (ANUSOL-HC) 2.5 % rectal cream 1 application  1 application Rectal BID PRN Cammie Sickle, MD      . ondansetron (ZOFRAN) tablet 4-8 mg  4-8 mg Oral Q8H PRN Cammie Sickle, MD       Or  . ondansetron (ZOFRAN-ODT) disintegrating tablet 4-8 mg  4-8 mg Oral Q8H PRN Cammie Sickle, MD       Or  . ondansetron (ZOFRAN) injection 4 mg  4 mg Intravenous Q8H PRN Cammie Sickle, MD       Or  . ondansetron (ZOFRAN) 8 mg in sodium chloride 0.9 % 50 mL IVPB  8 mg Intravenous Q8H PRN Cammie Sickle, MD      .  piperacillin-tazobactam (ZOSYN) IVPB 3.375 g  3.375 g Intravenous 3 times per day Ramond Dial, RPH   3.375 g at 08/31/15 1436   Facility-Administered Medications Ordered in Other Encounters  Medication Dose Route Frequency Provider Last Rate Last Dose  . fludeoxyglucose F - 18 (FDG) injection 11.21 milli Curie  11.21 milli Curie Intravenous Once PRN Forest Gleason, MD   11.21 milli Curie at 08/29/15 1233    OBJECTIVE: Filed Vitals:   08/31/15 1406  BP: 102/64  Pulse: 62  Temp: 98.2 F (36.8 C)  Resp: 17     Body mass index is 22.88 kg/(m^2).    ECOG FS:1 - Symptomatic but completely  ambulatory  General: Well-developed, well-nourished, no acute distress. Eyes: Pink conjunctiva, anicteric sclera. Lungs: Clear to auscultation bilaterally. Heart: Regular rate and rhythm. No rubs, murmurs, or gallops. Abdomen: Soft, nontender, nondistended. normoactive bowel sounds. Musculoskeletal: No edema, cyanosis, or clubbing. Neuro: Alert, answering all questions appropriately. Cranial nerves grossly intact. Skin: No rashes or petechiae noted. Psych: Normal affect.   LAB RESULTS:  Lab Results  Component Value Date   NA 139 08/31/2015   K 3.4* 08/31/2015   CL 111 08/31/2015   CO2 24 08/31/2015   GLUCOSE 93 08/31/2015   BUN 16 08/31/2015   CREATININE 1.00 08/31/2015   CALCIUM 7.6* 08/31/2015   PROT 5.0* 08/30/2015   ALBUMIN 2.2* 08/30/2015   AST 12* 08/30/2015   ALT 10* 08/30/2015   ALKPHOS 138* 08/30/2015   BILITOT 1.0 08/30/2015   GFRNONAA >60 08/31/2015   GFRAA >60 08/31/2015    Lab Results  Component Value Date   WBC 16.1* 08/31/2015   NEUTROABS 12.8* 08/31/2015   HGB 8.1* 08/31/2015   HCT 25.6* 08/31/2015   MCV 77.9* 08/31/2015   PLT 246 08/31/2015     STUDIES: Nm Pet Image Restag (ps) Skull Base To Thigh  08/29/2015   CLINICAL DATA:  Subsequent treatment strategy for lymphoma. Abdominal pain, diarrhea, leukocytosis, and splenomegaly.  EXAM: NUCLEAR MEDICINE PET SKULL BASE TO THIGH  TECHNIQUE: 11.2 mCi F-18 FDG was injected intravenously. Full-ring PET imaging was performed from the skull base to thigh after the radiotracer. CT data was obtained and used for attenuation correction and anatomic localization.  FASTING BLOOD GLUCOSE:  Value: 92 mg/dl  COMPARISON:  Chest CT on 10/17/2014 and PET-CT on 02/12/2014  FINDINGS: NECK  No hypermetabolic lymph nodes in the neck.  CHEST  A 6 mm hypermetabolic mediastinal lymph node is seen just posterior to the lower thoracic esophagus on image 89 of series 3, which is hypermetabolic with SUV max of 7.1. This is new since  previous study. No other hypermetabolic lymph nodes identified within the thorax. No suspicious pulmonary nodules seen on CT. Emphysema noted.  ABDOMEN/PELVIS  Small hypermetabolic soft tissue nodules are seen along the capsular surface of the liver and spleen. Largest is seen along the anterior aspect of the left hepatic lobe measuring 1.6 cm on image 121 of series 3, with SUV max of 8.2. There is also a tiny hypermetabolic focus in the left pelvic cul-de-sac which has an SUV max of 5.0 on image 63 of series 4. Trace amount of ascites is seen. These findings are highly suspicious with peritoneal carcinoma.  A 10.2 cm mass is seen in the left upper quadrant which shows a thick hypermetabolic rim and stool or other particular debris centrally. This involves the splenic flexure of the colon and is new since previous study. SUV max measures 26.5. This is suspicious  for a perforated colon carcinoma.  Hypermetabolic lymphadenopathy is seen in the central abdominal mesentery medial to this mass, which measures 2.7 cm on image 145/series 3 with SUV max of 9.6. There is also a 8 mm hypermetabolic retroperitoneal lymph node in the aortocaval space on image 151/series 3, which has SUV max of 14.0. No hypermetabolic lymphadenopathy seen within the pelvis.  4.0 cm infrarenal abdominal aortic aneurysm is again seen, without significant change.  SKELETON  No focal hypermetabolic activity to suggest skeletal metastasis.  IMPRESSION: 10 cm left upper quadrant peripherally hypermetabolic mass with central stool or necrotic debris which involves the splenic flexure the colon. This is suspicious for perforated colon carcinoma.  Hypermetabolic peritoneal soft tissue nodules in the abdomen and pelvis and minimal ascites, consistent with peritoneal metastatic disease.  Mild hypermetabolic mesenteric and retroperitoneal lymphadenopathy, consistent with metastatic disease.  6 mm hypermetabolic mediastinal lymph node just posterior to the  lower thoracic esophagus. Metastatic disease cannot be excluded.  Stable 4.0 cm infrarenal abdominal aortic aneurysm.   Electronically Signed   By: Earle Gell M.D.   On: 08/29/2015 14:51    ASSESSMENT: History of follicular lymphoma, now with perforated: Likely secondary to possible colon cancer.   PLAN:    1. Left upper quadrant mass: PET scan results reviewed independently and reported as above. Highly suspicious for underlying malignancy of the colon. CEA is elevated at 16.9. Patient has a confirmed contained perforation and his symptoms are improving. Continue current plan of IV antibodies and supportive care. Case discussed with surgery who plans to postpone any intervention until acute symptoms resolve. 2. Follicular lymphoma: No acute issues at this time, monitor. 3. Abnormal EKG: Appreciate cardiology input, patient is a low to moderate risk for surgery.   Lloyd Huger, MD   08/31/2015 5:23 PM

## 2015-08-31 NOTE — Progress Notes (Signed)
   SUBJECTIVE: Pt state she slept well. Continues to deny CP or SOB   Filed Vitals:   08/30/15 1400 08/30/15 2149 08/31/15 0512 08/31/15 0518  BP: 136/67 100/59 97/34 98/50   Pulse: 74 57 65   Temp: 98.1 F (36.7 C) 97.6 F (36.4 C) 98.2 F (36.8 C)   TempSrc: Oral Oral Oral   Resp: 17 17 16    Height: 5\' 11"  (1.803 m)     Weight: 74.39 kg (164 lb)     SpO2: 99% 98% 97%     Intake/Output Summary (Last 24 hours) at 08/31/15 1020 Last data filed at 08/31/15 0917  Gross per 24 hour  Intake   1489 ml  Output    150 ml  Net   1339 ml    LABS: Basic Metabolic Panel:  Recent Labs  08/30/15 1450 08/31/15 0425  NA 136 139  K 3.5 3.4*  CL 104 111  CO2 23 24  GLUCOSE 90 93  BUN 16 16  CREATININE 0.93 1.00  CALCIUM 7.9* 7.6*   Liver Function Tests:  Recent Labs  08/30/15 1450  AST 12*  ALT 10*  ALKPHOS 138*  BILITOT 1.0  PROT 5.0*  ALBUMIN 2.2*   No results for input(s): LIPASE, AMYLASE in the last 72 hours. CBC:  Recent Labs  08/30/15 1450 08/31/15 0425  WBC 19.9* 16.1*  NEUTROABS 16.1* 12.8*  HGB 8.9* 8.1*  HCT 27.9* 25.6*  MCV 77.6* 77.9*  PLT 285 246   Cardiac Enzymes: No results for input(s): CKTOTAL, CKMB, CKMBINDEX, TROPONINI in the last 72 hours. BNP: Invalid input(s): POCBNP D-Dimer: No results for input(s): DDIMER in the last 72 hours. Hemoglobin A1C: No results for input(s): HGBA1C in the last 72 hours. Fasting Lipid Panel: No results for input(s): CHOL, HDL, LDLCALC, TRIG, CHOLHDL, LDLDIRECT in the last 72 hours. Thyroid Function Tests: No results for input(s): TSH, T4TOTAL, T3FREE, THYROIDAB in the last 72 hours.  Invalid input(s): FREET3 Anemia Panel: No results for input(s): VITAMINB12, FOLATE, FERRITIN, TIBC, IRON, RETICCTPCT in the last 72 hours.   PHYSICAL EXAM General: Well developed, well nourished, in no acute distress HEENT:  Normocephalic and atramatic Neck:  No JVD.  Lungs: Clear bilaterally to auscultation and  percussion. Heart: HRRR . Normal S1 and S2 without gallops or murmurs.  Abdomen: Bowel sounds are positive, mass LUQ Msk:  Back normal, normal gait. Normal strength and tone for age. Extremities: No clubbing, cyanosis or edema.   Neuro: Alert and oriented X 3. Psych:  Good affect, responds appropriately  TELEMETRY: Reviewed telemetry pt in NSR:  ASSESSMENT AND PLAN: Low-mod risk for surgery if indicated.   Patient and plan discussed with supervising Angie Hogg, Dr. Neoma Laming, who agrees with above findings.   Kelby Fam [place], [facility]  08/31/2015 10:20 AM

## 2015-08-31 NOTE — Progress Notes (Signed)
Patient ID: Craig Page, male   DOB: January 09, 1946, 69 y.o.   MRN: [id number] Only c/o diarrhea- 4-5 times since admission. No fever. VSS. Abd is soft with palpable tender mass left upper quadrant. Labs- wbc down to 16 from 19k. CEA elevated at 16 It appears more likely that he has a contained perforation of a splenic flexure cancer with suspected peritoneal mets. He is currently stable. Plan- treat with antibiotics and if he responds well , consider semi elective resection of splenic flexure in 2-3 weeks.This will allow for reanastamosis of colon as opposed to a colostomy. If he fails to respond will need surgery sooner. Have discussed all of this in full with pt.

## 2015-09-01 DIAGNOSIS — R1902 Left upper quadrant abdominal swelling, mass and lump: Secondary | ICD-10-CM

## 2015-09-01 DIAGNOSIS — E876 Hypokalemia: Secondary | ICD-10-CM

## 2015-09-01 DIAGNOSIS — R970 Elevated carcinoembryonic antigen [CEA]: Secondary | ICD-10-CM

## 2015-09-01 LAB — BASIC METABOLIC PANEL WITH GFR
Anion gap: 6 (ref 5–15)
BUN: 10 mg/dL (ref 6–20)
CO2: 21 mmol/L — ABNORMAL LOW (ref 22–32)
Calcium: 7.3 mg/dL — ABNORMAL LOW (ref 8.9–10.3)
Chloride: 110 mmol/L (ref 101–111)
Creatinine, Ser: 0.89 mg/dL (ref 0.61–1.24)
GFR calc Af Amer: >60 mL/min (ref >60)
GFR calc non Af Amer: >60 mL/min (ref >60)
Glucose, Bld: 104 mg/dL — ABNORMAL HIGH (ref 65–99)
Potassium: 2.8 mmol/L — CL (ref 3.5–5.1)
Sodium: 137 mmol/L (ref 135–145)

## 2015-09-01 LAB — CBC WITH DIFFERENTIAL/PLATELET
BASOS ABS: 0.1 10*3/uL (ref 0–0.1)
BASOS PCT: 1 %
Eosinophils Absolute: 0.8 10*3/uL — ABNORMAL HIGH (ref 0–0.7)
Eosinophils Relative: 4 %
HEMATOCRIT: 24.8 % — AB (ref 40.0–52.0)
Hemoglobin: 7.9 g/dL — ABNORMAL LOW (ref 13.0–18.0)
LYMPHS PCT: 12 %
Lymphs Abs: 2.1 10*3/uL (ref 1.0–3.6)
MCH: 24.8 pg — ABNORMAL LOW (ref 26.0–34.0)
MCHC: 31.9 g/dL — ABNORMAL LOW (ref 32.0–36.0)
MCV: 77.6 fL — AB (ref 80.0–100.0)
MONO ABS: 1.3 10*3/uL — AB (ref 0.2–1.0)
Monocytes Relative: 7 %
NEUTROS PCT: 76 %
Neutro Abs: 14.4 10*3/uL — ABNORMAL HIGH (ref 1.4–6.5)
Platelets: 246 10*3/uL (ref 150–440)
RBC: 3.2 MIL/uL — AB (ref 4.40–5.90)
RDW: 19.3 % — AB (ref 11.5–14.5)
WBC: 18.7 10*3/uL — AB (ref 3.8–10.6)

## 2015-09-01 MED ORDER — KCL IN DEXTROSE-NACL 30-5-0.45 MEQ/L-%-% IV SOLN
INTRAVENOUS | Status: DC
  Administered 2015-09-01 – 2015-09-02 (×5): via INTRAVENOUS
  Filled 2015-09-01 (×7): qty 1000

## 2015-09-01 MED ORDER — POTASSIUM CHLORIDE CRYS ER 20 MEQ PO TBCR
20.0000 meq | EXTENDED_RELEASE_TABLET | Freq: Two times a day (BID) | ORAL | Status: DC
  Administered 2015-09-01 – 2015-09-04 (×7): 20 meq via ORAL
  Filled 2015-09-01 (×7): qty 1

## 2015-09-01 NOTE — Progress Notes (Signed)
   SUBJECTIVE: Pt lying comfortably in bed, c/o mild abdominal pain. No CP or SOB.    Filed Vitals:   08/31/15 0518 08/31/15 1406 08/31/15 2033 09/01/15 0407  BP: 98/50 102/64 121/70 84/64  Pulse:  62 61 42  Temp:  98.2 F (36.8 C) 98.1 F (36.7 C) 98.1 F (36.7 C)  TempSrc:  Oral Oral Oral  Resp:  17 20 18   Height:      Weight:      SpO2:  98% 94% 97%    Intake/Output Summary (Last 24 hours) at 09/01/15 1030 Last data filed at 09/01/15 5284  Gross per 24 hour  Intake 2942.07 ml  Output     50 ml  Net 2892.07 ml    LABS: Basic Metabolic Panel:  Recent Labs  08/31/15 0425 09/01/15 0516  NA 139 137  K 3.4* 2.8*  CL 111 110  CO2 24 21*  GLUCOSE 93 104*  BUN 16 10  CREATININE 1.00 0.89  CALCIUM 7.6* 7.3*   Liver Function Tests:  Recent Labs  08/30/15 1450  AST 12*  ALT 10*  ALKPHOS 138*  BILITOT 1.0  PROT 5.0*  ALBUMIN 2.2*   No results for input(s): LIPASE, AMYLASE in the last 72 hours. CBC:  Recent Labs  08/31/15 0425 09/01/15 0516  WBC 16.1* 18.7*  NEUTROABS 12.8* 14.4*  HGB 8.1* 7.9*  HCT 25.6* 24.8*  MCV 77.9* 77.6*  PLT 246 246   Cardiac Enzymes: No results for input(s): CKTOTAL, CKMB, CKMBINDEX, TROPONINI in the last 72 hours. BNP: Invalid input(s): POCBNP D-Dimer: No results for input(s): DDIMER in the last 72 hours. Hemoglobin A1C: No results for input(s): HGBA1C in the last 72 hours. Fasting Lipid Panel: No results for input(s): CHOL, HDL, LDLCALC, TRIG, CHOLHDL, LDLDIRECT in the last 72 hours. Thyroid Function Tests: No results for input(s): TSH, T4TOTAL, T3FREE, THYROIDAB in the last 72 hours.  Invalid input(s): FREET3 Anemia Panel: No results for input(s): VITAMINB12, FOLATE, FERRITIN, TIBC, IRON, RETICCTPCT in the last 72 hours.   PHYSICAL EXAM General: Well developed, well nourished, in no acute distress General: Well developed, well nourished, in no acute distress HEENT: Normocephalic and atramatic Neck: No JVD.   Lungs: Clear bilaterally to auscultation and percussion. Heart: HRRR . Normal S1 and S2 without gallops or murmurs.  Abdomen: Bowel sounds are positive, mass LUQ Msk: Back normal, normal gait. Normal strength and tone for age. Extremities: No clubbing, cyanosis or edema.  Neuro: Alert and oriented X 3. Psych: Good affect, responds appropriately  TELEMETRY: Reviewed telemetry pt in NSR:  ASSESSMENT AND PLAN: Low-mod risk for surgery if indicated.  Patient and plan discussed with supervising provider, Dr. Neoma Laming, who agrees with above findings.   Craig Page [place], [place]  09/01/2015 10:30 AM

## 2015-09-01 NOTE — Progress Notes (Signed)
Spoke with Dr.Sankar per phone about critical potassium of 2.8 . New order to change iv fluids to d5 0.45% ns with 13meq of potassium Craig Page

## 2015-09-01 NOTE — Progress Notes (Signed)
Patient ID: Craig Page, male   DOB: 1946/10/07, 69 y.o.   MRN: [id number] Diarrhea has lessened. C/o some pain in same location-luq. Afebrile. BP is low but stable. Abdomen is soft, active bowel sounds. Tender luq-mass here is less distinct. WBC 18 K Potassium 2.8. Stable to minimally better. Will allow clear liqs today. Correct K. Reassess in am. If improving trend will plan for semi elective surgery in 2-3 weeks.

## 2015-09-01 NOTE — Progress Notes (Signed)
[place]  [phone] [phone]) [phone]  ID: MALVERN KADLEC OB: 09/18/1946  MR#: [id number]  QPY#:[id number]  Patient Care Team: Arlis Porta., MD as PCP - General (Family Medicine)  CHIEF COMPLAINT: Contained perforation at the splenic flexure, likely secondary to malignancy.  INTERVAL HISTORY: Patient sitting on edge of bed. Abdominal pain and diarrhea are improved. He is now tolerating a clear liquid diet. He denies any fevers. He offers no further specific complaints.  REVIEW OF SYSTEMS:   Review of Systems  Constitutional: Negative for fever.  Respiratory: Negative.   Cardiovascular: Negative.   Gastrointestinal: Positive for diarrhea. Negative for nausea and abdominal pain.    As per HPI. Otherwise, a complete review of systems is negatve.  PAST MEDICAL HISTORY: Past Medical History  Diagnosis Date  . CAD (coronary artery disease)     3v  . Ischemic cardiomyopathy   . Chronic systolic heart failure   . HTN (hypertension)   . HLD (hyperlipidemia)   . Follicular lymphoma   . Myocardial infarction 1992    treated with thrombolytics/notes 10/16/2014    PAST SURGICAL HISTORY: Past Surgical History  Procedure Laterality Date  . Hernia repair    . Umbilical hernia repair  1964  . Coronary artery bypass graft N/A 10/19/2014    Procedure: CORONARY ARTERY BYPASS GRAFTING (CABG);  Surgeon: Ivin Poot, MD;  Location: [facility];  Service: Open Heart Surgery;  Laterality: N/A;  Times 3 using left internal mammary artery and endoscopically harvested left saphenous vein  . Intraoperative transesophageal echocardiogram N/A 10/19/2014    Procedure: INTRAOPERATIVE TRANSESOPHAGEAL ECHOCARDIOGRAM;  Surgeon: Ivin Poot, MD;  Location: [facility];  Service: Open Heart Surgery;  Laterality: N/A;    FAMILY HISTORY Family History  Problem Relation Age of Onset  . Heart failure Mother     deceased  . Cirrhosis Father     deceased        ADVANCED DIRECTIVES:    HEALTH MAINTENANCE: Social History  Substance Use Topics  . Smoking status: Former Smoker -- 1.00 packs/day for 50 years    Types: Cigarettes    Quit date: 09/24/2014  . Smokeless tobacco: Never Used  . Alcohol Use: No     Colonoscopy:  PAP:  Bone density:  Lipid panel:  No Known Allergies  Current Facility-Administered Medications  Medication Dose Route Frequency Provider Last Rate Last Dose  . dextrose 5 % and 0.45 % NaCl with KCl 30 mEq/L infusion   Intravenous Continuous Christene Lye, MD 125 mL/hr at 09/01/15 0817    . guaiFENesin-dextromethorphan (ROBITUSSIN DM) 100-10 MG/5ML syrup 10 mL  10 mL Oral Q4H PRN Cammie Sickle, MD      . hydrocortisone (ANUSOL-HC) 2.5 % rectal cream 1 application  1 application Rectal BID PRN Cammie Sickle, MD      . ondansetron (ZOFRAN) tablet 4-8 mg  4-8 mg Oral Q8H PRN Cammie Sickle, MD       Or  . ondansetron (ZOFRAN-ODT) disintegrating tablet 4-8 mg  4-8 mg Oral Q8H PRN Cammie Sickle, MD       Or  . ondansetron (ZOFRAN) injection 4 mg  4 mg Intravenous Q8H PRN Cammie Sickle, MD       Or  . ondansetron (ZOFRAN) 8 mg in sodium chloride 0.9 % 50 mL IVPB  8 mg Intravenous Q8H PRN Cammie Sickle, MD      . piperacillin-tazobactam (ZOSYN) IVPB 3.375 g  3.375  g Intravenous 3 times per day Ramond Dial, RPH   3.375 g at 09/01/15 0519  . potassium chloride SA (K-DUR,KLOR-CON) CR tablet 20 mEq  20 mEq Oral BID Christene Lye, MD   20 mEq at 09/01/15 1058   Facility-Administered Medications Ordered in Other Encounters  Medication Dose Route Frequency Provider Last Rate Last Dose  . fludeoxyglucose F - 18 (FDG) injection 11.21 milli Curie  11.21 milli Curie Intravenous Once PRN Forest Gleason, MD   11.21 milli Curie at 08/29/15 1233    OBJECTIVE: Filed Vitals:   09/01/15 0407  BP: 84/64  Pulse: 42  Temp: 98.1 F (36.7 C)  Resp: 18     Body mass index  is 22.88 kg/(m^2).    ECOG FS:1 - Symptomatic but completely ambulatory  General: Well-developed, well-nourished, no acute distress. Eyes: Pink conjunctiva, anicteric sclera. Lungs: Clear to auscultation bilaterally. Heart: Regular rate and rhythm. No rubs, murmurs, or gallops. Abdomen: Soft, nontender, nondistended. normoactive bowel sounds. Musculoskeletal: No edema, cyanosis, or clubbing. Neuro: Alert, answering all questions appropriately. Cranial nerves grossly intact. Skin: No rashes or petechiae noted. Psych: Normal affect.   LAB RESULTS:  Lab Results  Component Value Date   NA 137 09/01/2015   K 2.8* 09/01/2015   CL 110 09/01/2015   CO2 21* 09/01/2015   GLUCOSE 104* 09/01/2015   BUN 10 09/01/2015   CREATININE 0.89 09/01/2015   CALCIUM 7.3* 09/01/2015   PROT 5.0* 08/30/2015   ALBUMIN 2.2* 08/30/2015   AST 12* 08/30/2015   ALT 10* 08/30/2015   ALKPHOS 138* 08/30/2015   BILITOT 1.0 08/30/2015   GFRNONAA >60 09/01/2015   GFRAA >60 09/01/2015    Lab Results  Component Value Date   WBC 18.7* 09/01/2015   NEUTROABS 14.4* 09/01/2015   HGB 7.9* 09/01/2015   HCT 24.8* 09/01/2015   MCV 77.6* 09/01/2015   PLT 246 09/01/2015     STUDIES: Nm Pet Image Restag (ps) Skull Base To Thigh  08/29/2015   CLINICAL DATA:  Subsequent treatment strategy for lymphoma. Abdominal pain, diarrhea, leukocytosis, and splenomegaly.  EXAM: NUCLEAR MEDICINE PET SKULL BASE TO THIGH  TECHNIQUE: 11.2 mCi F-18 FDG was injected intravenously. Full-ring PET imaging was performed from the skull base to thigh after the radiotracer. CT data was obtained and used for attenuation correction and anatomic localization.  FASTING BLOOD GLUCOSE:  Value: 92 mg/dl  COMPARISON:  Chest CT on 10/17/2014 and PET-CT on 02/12/2014  FINDINGS: NECK  No hypermetabolic lymph nodes in the neck.  CHEST  A 6 mm hypermetabolic mediastinal lymph node is seen just posterior to the lower thoracic esophagus on image 89 of series 3,  which is hypermetabolic with SUV max of 7.1. This is new since previous study. No other hypermetabolic lymph nodes identified within the thorax. No suspicious pulmonary nodules seen on CT. Emphysema noted.  ABDOMEN/PELVIS  Small hypermetabolic soft tissue nodules are seen along the capsular surface of the liver and spleen. Largest is seen along the anterior aspect of the left hepatic lobe measuring 1.6 cm on image 121 of series 3, with SUV max of 8.2. There is also a tiny hypermetabolic focus in the left pelvic cul-de-sac which has an SUV max of 5.0 on image 63 of series 4. Trace amount of ascites is seen. These findings are highly suspicious with peritoneal carcinoma.  A 10.2 cm mass is seen in the left upper quadrant which shows a thick hypermetabolic rim and stool or other particular debris centrally. This involves  the splenic flexure of the colon and is new since previous study. SUV max measures 26.5. This is suspicious for a perforated colon carcinoma.  Hypermetabolic lymphadenopathy is seen in the central abdominal mesentery medial to this mass, which measures 2.7 cm on image 145/series 3 with SUV max of 9.6. There is also a 8 mm hypermetabolic retroperitoneal lymph node in the aortocaval space on image 151/series 3, which has SUV max of 14.0. No hypermetabolic lymphadenopathy seen within the pelvis.  4.0 cm infrarenal abdominal aortic aneurysm is again seen, without significant change.  SKELETON  No focal hypermetabolic activity to suggest skeletal metastasis.  IMPRESSION: 10 cm left upper quadrant peripherally hypermetabolic mass with central stool or necrotic debris which involves the splenic flexure the colon. This is suspicious for perforated colon carcinoma.  Hypermetabolic peritoneal soft tissue nodules in the abdomen and pelvis and minimal ascites, consistent with peritoneal metastatic disease.  Mild hypermetabolic mesenteric and retroperitoneal lymphadenopathy, consistent with metastatic disease.  6  mm hypermetabolic mediastinal lymph node just posterior to the lower thoracic esophagus. Metastatic disease cannot be excluded.  Stable 4.0 cm infrarenal abdominal aortic aneurysm.   Electronically Signed   By: Earle Gell M.D.   On: 08/29/2015 14:51    ASSESSMENT: History of follicular lymphoma, now with perforated: Likely secondary to possible colon cancer.   PLAN:    1. Left upper quadrant mass: PET scan results reviewed independently and reported as above. Highly suspicious for underlying malignancy of the colon. CEA is elevated at 16.9. Patient has a confirmed contained perforation and his symptoms are improving. Continue current plan of IV antibodies and supportive care. Case discussed with surgery who plans to postpone any intervention for several weeks. 2. Follicular lymphoma: No acute issues at this time, monitor. 3. Abnormal EKG: Appreciate cardiology input, patient is a low to moderate risk for surgery. 4. Hypokalemia: Replace as indicated. Likely secondary to diarrhea which is resolving. Next line 5. Disposition: Possible discharge in the next 1-2 days.   Lloyd Huger, MD   09/01/2015 11:19 AM

## 2015-09-02 ENCOUNTER — Inpatient Hospital Stay: Payer: Commercial Managed Care - HMO | Admitting: Oncology

## 2015-09-02 DIAGNOSIS — R161 Splenomegaly, not elsewhere classified: Secondary | ICD-10-CM

## 2015-09-02 DIAGNOSIS — R100 Acute abdomen: Secondary | ICD-10-CM

## 2015-09-02 LAB — CBC WITH DIFFERENTIAL/PLATELET
Basophils Absolute: 0.1 10*3/uL (ref 0–0.1)
Basophils Relative: 1 %
EOS ABS: 0.9 10*3/uL — AB (ref 0–0.7)
EOS PCT: 5 %
HCT: 26.3 % — ABNORMAL LOW (ref 40.0–52.0)
Hemoglobin: 8.4 g/dL — ABNORMAL LOW (ref 13.0–18.0)
LYMPHS ABS: 2.2 10*3/uL (ref 1.0–3.6)
Lymphocytes Relative: 11 %
MCH: 25 pg — AB (ref 26.0–34.0)
MCHC: 32 g/dL (ref 32.0–36.0)
MCV: 78.1 fL — ABNORMAL LOW (ref 80.0–100.0)
MONOS PCT: 7 %
Monocytes Absolute: 1.4 10*3/uL — ABNORMAL HIGH (ref 0.2–1.0)
Neutro Abs: 14.9 10*3/uL — ABNORMAL HIGH (ref 1.4–6.5)
Neutrophils Relative %: 76 %
PLATELETS: 230 10*3/uL (ref 150–440)
RBC: 3.37 MIL/uL — ABNORMAL LOW (ref 4.40–5.90)
RDW: 19.4 % — AB (ref 11.5–14.5)
WBC: 19.6 10*3/uL — ABNORMAL HIGH (ref 3.8–10.6)

## 2015-09-02 LAB — POTASSIUM: POTASSIUM: 3.7 mmol/L (ref 3.5–5.1)

## 2015-09-02 MED ORDER — ALUM & MAG HYDROXIDE-SIMETH 200-200-20 MG/5ML PO SUSP: 15.0000 mL | ORAL | Status: DC | PRN

## 2015-09-02 MED ORDER — CIPROFLOXACIN HCL 500 MG PO TABS
750.0000 mg | ORAL_TABLET | Freq: Two times a day (BID) | ORAL | Status: DC
  Administered 2015-09-02 – 2015-09-04 (×4): 750 mg via ORAL
  Filled 2015-09-02 (×4): qty 1

## 2015-09-02 MED ORDER — PANTOPRAZOLE SODIUM 40 MG PO TBEC
40.0000 mg | DELAYED_RELEASE_TABLET | Freq: Every day | ORAL | Status: DC
  Administered 2015-09-02 – 2015-09-04 (×3): 40 mg via ORAL
  Filled 2015-09-02 (×3): qty 1

## 2015-09-02 MED ORDER — METRONIDAZOLE 500 MG PO TABS
500.0000 mg | ORAL_TABLET | Freq: Three times a day (TID) | ORAL | Status: DC
Start: 2015-09-02 — End: 2015-09-04
  Administered 2015-09-02 – 2015-09-04 (×5): 500 mg via ORAL
  Filled 2015-09-02 (×5): qty 1

## 2015-09-02 NOTE — Progress Notes (Signed)
[place] @ [facility] [phone]  Fax:(336) [place] Shaheed OB: 06-25-1946  MR#: [id number]  VZC#:[id number]  Patient Care Team: Arlis Porta., MD as PCP - General (Family Medicine)  CHIEF COMPLAINT:  No chief complaint on file.   1. 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 Stage IIIa. 2. Finished a maintenance Rituxan in October of 2012. 3. PET scan in March 2015 shows complete remission. PET scan was done in November 2016 in  [facility] prior to open heart surgery and was reported to be negative.   Oncology Flowsheet 10/17/2014 10/18/2014 10/19/2014  diazepam (VALIUM) PO - - 5 mg  enoxaparin (LOVENOX) [place] 40 mg 40 mg -    INTERVAL HISTORY:  69 year old gentleman came today further follow-up because of number of complaints.  Over.  Of time patient's condition has been declining.  Having increasing diarrhea for last 4 weeks.  2-3 stools every day. Patient was admitted in the hospital with suspected colon perforation.  Was started on IV antibiotics.  Abdominal pain is improved continues to have diarrhea leukocytosis persists.  REVIEW OF SYSTEMS:   Gen. Status: Weakness has improved.  Abdominal pain is better.  Continues to 2 or 3 loose stool.  No nausea.  No vomiting.  No chills.  No fever. PAST MEDICAL HISTORY: Past Medical History  Diagnosis Date  . CAD (coronary artery disease)     3v  . Ischemic cardiomyopathy   . Chronic systolic heart failure   . HTN (hypertension)   . HLD (hyperlipidemia)   . Follicular lymphoma   . Myocardial infarction 1992    treated with thrombolytics/notes 10/16/2014    PAST SURGICAL HISTORY: Past Surgical History  Procedure Laterality Date  . Hernia repair    . Umbilical hernia repair  1964  . Coronary artery bypass graft N/A 10/19/2014    Procedure: CORONARY ARTERY BYPASS GRAFTING (CABG);  Surgeon: Ivin Poot, MD;   Location: [facility];  Service: Open Heart Surgery;  Laterality: N/A;  Times 3 using left internal mammary artery and endoscopically harvested left saphenous vein  . Intraoperative transesophageal echocardiogram N/A 10/19/2014    Procedure: INTRAOPERATIVE TRANSESOPHAGEAL ECHOCARDIOGRAM;  Surgeon: Ivin Poot, MD;  Location: [facility];  Service: Open Heart Surgery;  Laterality: N/A;    FAMILY HISTORY Family History  Problem Relation Age of Onset  . Heart failure Mother     deceased  . Cirrhosis Father     deceased   Significant History/PMH:   Hypertension:    Hyperlipidemia:    Acute Myocardial Infarction:    CAD:    chf:    lymphoma:    Tobacco Use:    CABG: Nov 2015  PFSH: Comments: daughter also has lymphoma  Comments: positive for tobacco use,  30 years history of smokingdoes not drink  Additional Past Medical and Surgical History: hypocholesteremia   Hypertension   History of coronary artery disease   Gastroesophageal reflux disease    ADVANCED DIRECTIVES:  No flowsheet data found.  HEALTH MAINTENANCE: Social History  Substance Use Topics  . Smoking status: Former Smoker -- 1.00 packs/day for 50 years    Types: Cigarettes    Quit date: 09/24/2014  . Smokeless tobacco: Never Used  . Alcohol Use: No      No Known Allergies  Current Facility-Administered Medications  Medication Dose Route Frequency Provider Last Rate Last Dose  . alum & mag  hydroxide-simeth (MAALOX/MYLANTA) 200-200-20 MG/5ML suspension 15 mL  15 mL Oral Q4H PRN Forest Gleason, MD      . ciprofloxacin (CIPRO) tablet 750 mg  750 mg Oral BID Forest Gleason, MD      . dextrose 5 % and 0.45 % NaCl with KCl 30 mEq/L infusion   Intravenous Continuous Lequita Asal, MD 75 mL/hr at 09/02/15 1921    . guaiFENesin-dextromethorphan (ROBITUSSIN DM) 100-10 MG/5ML syrup 10 mL  10 mL Oral Q4H PRN Cammie Sickle, MD      . hydrocortisone (ANUSOL-HC) 2.5 % rectal cream 1 application  1 application  Rectal BID PRN Cammie Sickle, MD      . metroNIDAZOLE (FLAGYL) tablet 500 mg  500 mg Oral 3 times per day Forest Gleason, MD      . ondansetron (ZOFRAN) tablet 4-8 mg  4-8 mg Oral Q8H PRN Cammie Sickle, MD       Or  . ondansetron (ZOFRAN-ODT) disintegrating tablet 4-8 mg  4-8 mg Oral Q8H PRN Cammie Sickle, MD       Or  . ondansetron (ZOFRAN) injection 4 mg  4 mg Intravenous Q8H PRN Cammie Sickle, MD       Or  . ondansetron (ZOFRAN) 8 mg in sodium chloride 0.9 % 50 mL IVPB  8 mg Intravenous Q8H PRN Cammie Sickle, MD      . pantoprazole (PROTONIX) EC tablet 40 mg  40 mg Oral Daily Forest Gleason, MD   40 mg at 09/02/15 1610  . piperacillin-tazobactam (ZOSYN) IVPB 3.375 g  3.375 g Intravenous 3 times per day Forest Gleason, MD   3.375 g at 09/02/15 1452  . potassium chloride SA (K-DUR,KLOR-CON) CR tablet 20 mEq  20 mEq Oral BID Christene Lye, MD   20 mEq at 09/02/15 1031   Facility-Administered Medications Ordered in Other Encounters  Medication Dose Route Frequency Provider Last Rate Last Dose  . fludeoxyglucose F - 18 (FDG) injection 11.21 milli Curie  11.21 milli Curie Intravenous Once PRN Forest Gleason, MD   11.21 milli Curie at 08/29/15 1233    OBJECTIVE:  Filed Vitals:   09/02/15 1249  BP: 112/60  Pulse: 60  Temp: 98 F (36.7 C)  Resp: 16     Body mass index is 22.88 kg/(m^2).    ECOG FS:1 - Symptomatic but completely ambulatory  PHYSICAL EXAM: Gen. status: Patient is feeling week tired. Head exam was generally normal. There was no scleral icterus or corneal arcus. Mucous membranes were moist. Examination of the chest was unremarkable. There were no bony deformities, no asymmetry, and no other abnormalities. Cardiac: Tachycardia blood pressure is slightly low soft systolic murmur Abdomen: Mild distention.  And large spleen 6-7 cm below costal margin no ascites small sounds are present Lymphatic system: Small palpable lymph node in the left  inguinal area.  No other palpable lymphadenopathy Lower extremity trace edema Neurologically, the patient was awake, alert, and oriented to person, place and time. There were no obvious focal neurologic abnormalities. Examination of the skin revealed no evidence of significant rashes, suspicious appearing nevi or other concerning lesions.    LAB RESULTS:  CBC Latest Ref Rng 09/02/2015 09/01/2015  WBC 3.8 - 10.6 K/uL 19.6(H) 18.7(H)  Hemoglobin 13.0 - 18.0 g/dL 8.4(L) 7.9(L)  Hematocrit 40.0 - 52.0 % 26.3(L) 24.8(L)  Platelets 150 - 440 K/uL 230 246    Admission on 08/30/2015  Component Date Value Ref Range Status  . CEA 08/30/2015 16.9* 0.0 -  4.7 ng/mL Final   Comment: (NOTE)       Roche ECLIA methodology       Nonsmokers  <3.9                                     Smokers     <5.6 Performed At: [facility] [place], [place] [id number] Lindon Romp MD WU:[id number]   . aPTT 08/30/2015 35  24 - 36 seconds Final  . WBC 08/30/2015 19.9* 3.8 - 10.6 K/uL Final  . RBC 08/30/2015 3.59* 4.40 - 5.90 MIL/uL Final  . Hemoglobin 08/30/2015 8.9* 13.0 - 18.0 g/dL Final  . HCT 08/30/2015 27.9* 40.0 - 52.0 % Final  . MCV 08/30/2015 77.6* 80.0 - 100.0 fL Final  . MCH 08/30/2015 24.7* 26.0 - 34.0 pg Final  . MCHC 08/30/2015 31.9* 32.0 - 36.0 g/dL Final  . RDW 08/30/2015 19.0* 11.5 - 14.5 % Final  . Platelets 08/30/2015 285  150 - 440 K/uL Final  . Neutrophils Relative % 08/30/2015 80   Final  . Neutro Abs 08/30/2015 16.1* 1.4 - 6.5 K/uL Final  . Lymphocytes Relative 08/30/2015 8   Final  . Lymphs Abs 08/30/2015 1.6  1.0 - 3.6 K/uL Final  . Monocytes Relative 08/30/2015 7   Final  . Monocytes Absolute 08/30/2015 1.3* 0.2 - 1.0 K/uL Final  . Eosinophils Relative 08/30/2015 4   Final  . Eosinophils Absolute 08/30/2015 0.7  0 - 0.7 K/uL Final  . Basophils Relative 08/30/2015 1   Final  . Basophils Absolute 08/30/2015 0.1  0 - 0.1 K/uL Final  . Sodium 08/30/2015 136  135  - 145 mmol/L Final  . Potassium 08/30/2015 3.5  3.5 - 5.1 mmol/L Final  . Chloride 08/30/2015 104  101 - 111 mmol/L Final  . CO2 08/30/2015 23  22 - 32 mmol/L Final  . Glucose, Bld 08/30/2015 90  65 - 99 mg/dL Final  . BUN 08/30/2015 16  6 - 20 mg/dL Final  . Creatinine, Ser 08/30/2015 0.93  0.61 - 1.24 mg/dL Final  . Calcium 08/30/2015 7.9* 8.9 - 10.3 mg/dL Final  . Total Protein 08/30/2015 5.0* 6.5 - 8.1 g/dL Final  . Albumin 08/30/2015 2.2* 3.5 - 5.0 g/dL Final  . AST 08/30/2015 12* 15 - 41 U/L Final  . ALT 08/30/2015 10* 17 - 63 U/L Final  . Alkaline Phosphatase 08/30/2015 138* 38 - 126 U/L Final  . Total Bilirubin 08/30/2015 1.0  0.3 - 1.2 mg/dL Final  . GFR calc non Af Amer 08/30/2015 >60  >60 mL/min Final  . GFR calc Af Amer 08/30/2015 >60  >60 mL/min Final   Comment: (NOTE) The eGFR has been calculated using the CKD EPI equation. This calculation has not been validated in all clinical situations. eGFR's persistently <60 mL/min signify possible Chronic Kidney Disease.   . Anion gap 08/30/2015 9  5 - 15 Final  . Prothrombin Time 08/30/2015 18.2* 11.4 - 15.0 seconds Final  . INR 08/30/2015 1.49   Final  . ABO/RH(D) 08/30/2015 O POS   Final  . Antibody Screen 08/30/2015 NEG   Final  . Sample Expiration 08/30/2015 09/02/2015   Final  . ABO/RH(D) 08/30/2015 O POS   Final  . Sodium 08/31/2015 139  135 - 145 mmol/L Final  . Potassium 08/31/2015 3.4* 3.5 - 5.1 mmol/L Final  . Chloride 08/31/2015 111  101 - 111 mmol/L  Final  . CO2 08/31/2015 24  22 - 32 mmol/L Final  . Glucose, Bld 08/31/2015 93  65 - 99 mg/dL Final  . BUN 08/31/2015 16  6 - 20 mg/dL Final  . Creatinine, Ser 08/31/2015 1.00  0.61 - 1.24 mg/dL Final  . Calcium 08/31/2015 7.6* 8.9 - 10.3 mg/dL Final  . GFR calc non Af Amer 08/31/2015 >60  >60 mL/min Final  . GFR calc Af Amer 08/31/2015 >60  >60 mL/min Final   Comment: (NOTE) The eGFR has been calculated using the CKD EPI equation. This calculation has not been  validated in all clinical situations. eGFR's persistently <60 mL/min signify possible Chronic Kidney Disease.   . Anion gap 08/31/2015 4* 5 - 15 Final  . WBC 08/31/2015 16.1* 3.8 - 10.6 K/uL Final  . RBC 08/31/2015 3.28* 4.40 - 5.90 MIL/uL Final  . Hemoglobin 08/31/2015 8.1* 13.0 - 18.0 g/dL Final  . HCT 08/31/2015 25.6* 40.0 - 52.0 % Final  . MCV 08/31/2015 77.9* 80.0 - 100.0 fL Final  . MCH 08/31/2015 24.7* 26.0 - 34.0 pg Final  . MCHC 08/31/2015 31.7* 32.0 - 36.0 g/dL Final  . RDW 08/31/2015 19.1* 11.5 - 14.5 % Final  . Platelets 08/31/2015 246  150 - 440 K/uL Final  . Neutrophils Relative % 08/31/2015 79   Final  . Neutro Abs 08/31/2015 12.8* 1.4 - 6.5 K/uL Final  . Lymphocytes Relative 08/31/2015 10   Final  . Lymphs Abs 08/31/2015 1.5  1.0 - 3.6 K/uL Final  . Monocytes Relative 08/31/2015 6   Final  . Monocytes Absolute 08/31/2015 0.9  0.2 - 1.0 K/uL Final  . Eosinophils Relative 08/31/2015 4   Final  . Eosinophils Absolute 08/31/2015 0.7  0 - 0.7 K/uL Final  . Basophils Relative 08/31/2015 1   Final  . Basophils Absolute 08/31/2015 0.1  0 - 0.1 K/uL Final  . WBC 09/01/2015 18.7* 3.8 - 10.6 K/uL Final  . RBC 09/01/2015 3.20* 4.40 - 5.90 MIL/uL Final  . Hemoglobin 09/01/2015 7.9* 13.0 - 18.0 g/dL Final  . HCT 09/01/2015 24.8* 40.0 - 52.0 % Final  . MCV 09/01/2015 77.6* 80.0 - 100.0 fL Final  . MCH 09/01/2015 24.8* 26.0 - 34.0 pg Final  . MCHC 09/01/2015 31.9* 32.0 - 36.0 g/dL Final  . RDW 09/01/2015 19.3* 11.5 - 14.5 % Final  . Platelets 09/01/2015 246  150 - 440 K/uL Final  . Neutrophils Relative % 09/01/2015 76   Final  . Neutro Abs 09/01/2015 14.4* 1.4 - 6.5 K/uL Final  . Lymphocytes Relative 09/01/2015 12   Final  . Lymphs Abs 09/01/2015 2.1  1.0 - 3.6 K/uL Final  . Monocytes Relative 09/01/2015 7   Final  . Monocytes Absolute 09/01/2015 1.3* 0.2 - 1.0 K/uL Final  . Eosinophils Relative 09/01/2015 4   Final  . Eosinophils Absolute 09/01/2015 0.8* 0 - 0.7 K/uL Final    . Basophils Relative 09/01/2015 1   Final  . Basophils Absolute 09/01/2015 0.1  0 - 0.1 K/uL Final  . Sodium 09/01/2015 137  135 - 145 mmol/L Final  . Potassium 09/01/2015 2.8* 3.5 - 5.1 mmol/L Final   CRITICAL RESULT CALLED TO, READ BACK BY AND VERIFIED WITH TAMARA CONYERS AT 7169 09/01/15.PMH  . Chloride 09/01/2015 110  101 - 111 mmol/L Final  . CO2 09/01/2015 21* 22 - 32 mmol/L Final  . Glucose, Bld 09/01/2015 104* 65 - 99 mg/dL Final  . BUN 09/01/2015 10  6 - 20 mg/dL Final  .  Creatinine, Ser 09/01/2015 0.89  0.61 - 1.24 mg/dL Final  . Calcium 09/01/2015 7.3* 8.9 - 10.3 mg/dL Final  . GFR calc non Af Amer 09/01/2015 >60  >60 mL/min Final  . GFR calc Af Amer 09/01/2015 >60  >60 mL/min Final   Comment: (NOTE) The eGFR has been calculated using the CKD EPI equation. This calculation has not been validated in all clinical situations. eGFR's persistently <60 mL/min signify possible Chronic Kidney Disease.   . Anion gap 09/01/2015 6  5 - 15 Final  . WBC 09/02/2015 19.6* 3.8 - 10.6 K/uL Final  . RBC 09/02/2015 3.37* 4.40 - 5.90 MIL/uL Final  . Hemoglobin 09/02/2015 8.4* 13.0 - 18.0 g/dL Final  . HCT 09/02/2015 26.3* 40.0 - 52.0 % Final  . MCV 09/02/2015 78.1* 80.0 - 100.0 fL Final  . MCH 09/02/2015 25.0* 26.0 - 34.0 pg Final  . MCHC 09/02/2015 32.0  32.0 - 36.0 g/dL Final  . RDW 09/02/2015 19.4* 11.5 - 14.5 % Final  . Platelets 09/02/2015 230  150 - 440 K/uL Final  . Neutrophils Relative % 09/02/2015 76   Final  . Neutro Abs 09/02/2015 14.9* 1.4 - 6.5 K/uL Final  . Lymphocytes Relative 09/02/2015 11   Final  . Lymphs Abs 09/02/2015 2.2  1.0 - 3.6 K/uL Final  . Monocytes Relative 09/02/2015 7   Final  . Monocytes Absolute 09/02/2015 1.4* 0.2 - 1.0 K/uL Final  . Eosinophils Relative 09/02/2015 5   Final  . Eosinophils Absolute 09/02/2015 0.9* 0 - 0.7 K/uL Final  . Basophils Relative 09/02/2015 1   Final  . Basophils Absolute 09/02/2015 0.1  0 - 0.1 K/uL Final  . Potassium  09/02/2015 3.7  3.5 - 5.1 mmol/L Final  Hospital Outpatient Visit on 08/29/2015  Component Date Value Ref Range Status  . Glucose-Capillary 08/29/2015 92  65 - 99 mg/dL Final        ASSESSMENT: Multiple complaints including leukocytosis, anemia, physical findings indicated splenomegaly. Perforated colon cancer versus lymphoma. Leukocytosis Overall improvement in left upper quadrant pain and left upper quadrant mass on clinical examination I discussed the situation with Dr. Jamal Collin , general surgeon Proceed with oral Cipro and Flagyl Discontinue Zosyn Advance diet The patient tolerated that will be discharged followed by possibility of surgical intervention in 2 weeks   Out of bed and ambulate Repeat blood count and chemistry tomorrow Discontinue IV by Earleen Reaper, MD   09/02/2015 7:58 PM

## 2015-09-02 NOTE — Care Management Important Message (Signed)
Important Message  Patient Details  Name: Craig Page MRN: [id number] Date of Birth: 1946-11-14   Medicare Important Message Given:  Yes-second notification given    Juliann Pulse A Allmond 09/02/2015, 11:08 AM

## 2015-09-02 NOTE — Progress Notes (Signed)
Patient ID: Craig Page, male   DOB: 12/20/1945, 68 y.o.   MRN: [id number] Pt states he feels better. Diarrhea resolved. Pain noteably decreased. AVSS. Abdomen is soft, LUQ mass is far less distinct on exam. Tenderness also is much reduced. Clinically he is responding to IV antibiotic. Advance diet slowly,. Switch to oral antibiotics tomorrow.

## 2015-09-02 NOTE — Progress Notes (Signed)
   SUBJECTIVE: Pt sitting up in chair. States abdominal mass has decreased in size. Feeling better.   Filed Vitals:   09/01/15 0407 09/01/15 1331 09/01/15 2150 09/02/15 0537  BP: 84/64 115/50 110/60 110/44  Pulse: 42 57 60 60  Temp: 98.1 F (36.7 C) 98.2 F (36.8 C) 98 F (36.7 C) 98.5 F (36.9 C)  TempSrc: Oral Oral Oral Oral  Resp: 18 16 18 18   Height:      Weight:      SpO2: 97% 98% 98% 100%    Intake/Output Summary (Last 24 hours) at 09/02/15 0906 Last data filed at 09/02/15 0829  Gross per 24 hour  Intake 2622.92 ml  Output    300 ml  Net 2322.92 ml    LABS: Basic Metabolic Panel:  Recent Labs  08/31/15 0425 09/01/15 0516 09/02/15 0636  NA 139 137  --   K 3.4* 2.8* 3.7  CL 111 110  --   CO2 24 21*  --   GLUCOSE 93 104*  --   BUN 16 10  --   CREATININE 1.00 0.89  --   CALCIUM 7.6* 7.3*  --    Liver Function Tests:  Recent Labs  08/30/15 1450  AST 12*  ALT 10*  ALKPHOS 138*  BILITOT 1.0  PROT 5.0*  ALBUMIN 2.2*   No results for input(s): LIPASE, AMYLASE in the last 72 hours. CBC:  Recent Labs  09/01/15 0516 09/02/15 0636  WBC 18.7* 19.6*  NEUTROABS 14.4* 14.9*  HGB 7.9* 8.4*  HCT 24.8* 26.3*  MCV 77.6* 78.1*  PLT 246 230   Cardiac Enzymes: No results for input(s): CKTOTAL, CKMB, CKMBINDEX, TROPONINI in the last 72 hours. BNP: Invalid input(s): POCBNP D-Dimer: No results for input(s): DDIMER in the last 72 hours. Hemoglobin A1C: No results for input(s): HGBA1C in the last 72 hours. Fasting Lipid Panel: No results for input(s): CHOL, HDL, LDLCALC, TRIG, CHOLHDL, LDLDIRECT in the last 72 hours. Thyroid Function Tests: No results for input(s): TSH, T4TOTAL, T3FREE, THYROIDAB in the last 72 hours.  Invalid input(s): FREET3 Anemia Panel: No results for input(s): VITAMINB12, FOLATE, FERRITIN, TIBC, IRON, RETICCTPCT in the last 72 hours.   PHYSICAL EXAM General: Well developed, well nourished, in no acute distress HEENT:  Normocephalic and atramatic Neck: No JVD.  Lungs: Clear bilaterally to auscultation and percussion. Heart: HRRR . Normal S1 and S2 without gallops or murmurs.  Abdomen: Bowel sounds are positive, mass LUQ Msk: Back normal, normal gait. Normal strength and tone for age. Extremities: No clubbing, cyanosis or edema.  Neuro: Alert and oriented X 3. Psych: Good affect, responds appropriately  TELEMETRY: Reviewed telemetry pt in NSR:  ASSESSMENT AND PLAN: Low-mod risk for surgery if indicated.  Patient and plan discussed with supervising provider, Dr. Neoma Laming, who agrees with above findings.   Kelby Fam [place], [place]  09/02/2015 9:06 AM

## 2015-09-02 NOTE — Consult Note (Signed)
ANTIBIOTIC CONSULT NOTE - FOLLOW UP   Pharmacy Consult for zosyn Indication: bowel perforation  No Known Allergies  Patient Measurements: Height: 5\' 11"  (180.3 cm) Weight: 164 lb (74.39 kg) IBW/kg (Calculated) : 75.3 Adjusted Body Weight:   Vital Signs: Temp: 98 F (36.7 C) (09/26 1249) Temp Source: Oral (09/26 1249) BP: 112/60 mmHg (09/26 1249) Pulse Rate: 60 (09/26 1249) Intake/Output from previous day: 09/25 0701 - 09/26 0700 In: 2589.6 [P.O.:600; I.V.:1926.9; IV Piggyback:62.7] Out: 300 [Urine:300] Intake/Output from this shift: Total I/O In: 994.6 [I.V.:964.6; IV Piggyback:30] Out: -   Labs:  Recent Labs  08/31/15 0425 09/01/15 0516 09/02/15 0636  WBC 16.1* 18.7* 19.6*  HGB 8.1* 7.9* 8.4*  PLT 246 246 230  CREATININE 1.00 0.89  --    Estimated Creatinine Clearance: 83.6 mL/min (by C-G formula based on Cr of 0.89). No results for input(s): VANCOTROUGH, VANCOPEAK, VANCORANDOM, GENTTROUGH, GENTPEAK, GENTRANDOM, TOBRATROUGH, TOBRAPEAK, TOBRARND, AMIKACINPEAK, AMIKACINTROU, AMIKACIN in the last 72 hours.   Microbiology: No results found for this or any previous visit (from the past 720 hour(s)).  Medical History: Past Medical History  Diagnosis Date  . CAD (coronary artery disease)     3v  . Ischemic cardiomyopathy   . Chronic systolic heart failure   . HTN (hypertension)   . HLD (hyperlipidemia)   . Follicular lymphoma   . Myocardial infarction 1992    treated with thrombolytics/notes 10/16/2014    Medications:  Scheduled:  . piperacillin-tazobactam (ZOSYN)  IV  3.375 g Intravenous 3 times per day  . potassium chloride  20 mEq Oral BID   Assessment: Pt is a 69 year old male admitted with a perforated colon. Pharmacy consulted to dose zosyn  Goal of Therapy:    Plan:  will give zosyn 3.375g q 8 hours ei infusion  Pharmacy to continue to monitor  Xyla Leisner D 09/02/2015,3:11 PM

## 2015-09-02 NOTE — Progress Notes (Signed)
Patient complained of having a lot of gas on his stomach and a lot of belching.  Dr. Oliva Bustard was paged, notified and he gave an order for maalox 14ml q4-6 hours as needed and for Prilosec 20mg  bid.  Substitution for prilosec per pharmacy is protonix 40mg  daily.

## 2015-09-03 LAB — CBC WITH DIFFERENTIAL/PLATELET
Basophils Absolute: 0.1 10*3/uL (ref 0–0.1)
Basophils Relative: 0 %
EOS PCT: 5 %
Eosinophils Absolute: 0.9 10*3/uL — ABNORMAL HIGH (ref 0–0.7)
HEMATOCRIT: 26.4 % — AB (ref 40.0–52.0)
Hemoglobin: 8.3 g/dL — ABNORMAL LOW (ref 13.0–18.0)
LYMPHS PCT: 10 %
Lymphs Abs: 1.7 10*3/uL (ref 1.0–3.6)
MCH: 24.6 pg — AB (ref 26.0–34.0)
MCHC: 31.5 g/dL — AB (ref 32.0–36.0)
MCV: 78.1 fL — AB (ref 80.0–100.0)
MONO ABS: 1.4 10*3/uL — AB (ref 0.2–1.0)
MONOS PCT: 8 %
NEUTROS ABS: 13.5 10*3/uL — AB (ref 1.4–6.5)
Neutrophils Relative %: 77 %
Platelets: 224 10*3/uL (ref 150–440)
RBC: 3.38 MIL/uL — ABNORMAL LOW (ref 4.40–5.90)
RDW: 19.5 % — AB (ref 11.5–14.5)
WBC: 17.6 10*3/uL — ABNORMAL HIGH (ref 3.8–10.6)

## 2015-09-03 LAB — COMPREHENSIVE METABOLIC PANEL
ALT: 8 U/L — ABNORMAL LOW (ref 17–63)
ANION GAP: 0 — AB (ref 5–15)
AST: 11 U/L — ABNORMAL LOW (ref 15–41)
Albumin: 2 g/dL — ABNORMAL LOW (ref 3.5–5.0)
Alkaline Phosphatase: 130 U/L — ABNORMAL HIGH (ref 38–126)
BILIRUBIN TOTAL: 1.1 mg/dL (ref 0.3–1.2)
BUN: 5 mg/dL — AB (ref 6–20)
CO2: 27 mmol/L (ref 22–32)
Calcium: 7.6 mg/dL — ABNORMAL LOW (ref 8.9–10.3)
Chloride: 112 mmol/L — ABNORMAL HIGH (ref 101–111)
Creatinine, Ser: 0.93 mg/dL (ref 0.61–1.24)
Glucose, Bld: 98 mg/dL (ref 65–99)
POTASSIUM: 4.4 mmol/L (ref 3.5–5.1)
Sodium: 139 mmol/L (ref 135–145)
TOTAL PROTEIN: 4.4 g/dL — AB (ref 6.5–8.1)

## 2015-09-03 NOTE — Progress Notes (Signed)
SUBJECTIVE: Pt continues to feel better, no CP or SOB. Ready to go home.   Filed Vitals:   09/02/15 0537 09/02/15 1249 09/02/15 2204 09/03/15 0532  BP: 110/44 112/60 106/55 104/49  Pulse: 60 60 69 55  Temp: 98.5 F (36.9 C) 98 F (36.7 C) 98.5 F (36.9 C) 98.1 F (36.7 C)  TempSrc: Oral Oral Oral Oral  Resp: 18 16 17 16   Height:      Weight:      SpO2: 100% 99% 96% 96%    Intake/Output Summary (Last 24 hours) at 09/03/15 0847 Last data filed at 09/03/15 0410  Gross per 24 hour  Intake 2579.58 ml  Output   1125 ml  Net 1454.58 ml    LABS: Basic Metabolic Panel:  Recent Labs  09/01/15 0516 09/02/15 0636 09/03/15 0448  NA 137  --  139  K 2.8* 3.7 4.4  CL 110  --  112*  CO2 21*  --  27  GLUCOSE 104*  --  98  BUN 10  --  5*  CREATININE 0.89  --  0.93  CALCIUM 7.3*  --  7.6*   Liver Function Tests:  Recent Labs  09/03/15 0448  AST 11*  ALT 8*  ALKPHOS 130*  BILITOT 1.1  PROT 4.4*  ALBUMIN 2.0*   No results for input(s): LIPASE, AMYLASE in the last 72 hours. CBC:  Recent Labs  09/02/15 0636 09/03/15 0448  WBC 19.6* 17.6*  NEUTROABS 14.9* 13.5*  HGB 8.4* 8.3*  HCT 26.3* 26.4*  MCV 78.1* 78.1*  PLT 230 224   Cardiac Enzymes: No results for input(s): CKTOTAL, CKMB, CKMBINDEX, TROPONINI in the last 72 hours. BNP: Invalid input(s): POCBNP D-Dimer: No results for input(s): DDIMER in the last 72 hours. Hemoglobin A1C: No results for input(s): HGBA1C in the last 72 hours. Fasting Lipid Panel: No results for input(s): CHOL, HDL, LDLCALC, TRIG, CHOLHDL, LDLDIRECT in the last 72 hours. Thyroid Function Tests: No results for input(s): TSH, T4TOTAL, T3FREE, THYROIDAB in the last 72 hours.  Invalid input(s): FREET3 Anemia Panel: No results for input(s): VITAMINB12, FOLATE, FERRITIN, TIBC, IRON, RETICCTPCT in the last 72 hours.   PHYSICAL EXAM General: Well developed, well nourished, in no acute distress HEENT: Normocephalic and atramatic Neck: No  JVD.  Lungs: Clear bilaterally to auscultation and percussion. Heart: HRRR . Normal S1 and S2 without gallops or murmurs.  Abdomen: Bowel sounds are positive, mass LUQ Msk: Back normal, normal gait. Normal strength and tone for age. Extremities: No clubbing, cyanosis or edema.  Neuro: Alert and oriented X 3. Psych: Good affect, responds appropriately  TELEMETRY: Reviewed telemetry pt in NSR:  ASSESSMENT AND PLAN: Low-mod risk for surgery if indicated. Pt given f/u in our office on Friday 9/30. Will recheck echo prior to surgery.   Active Problems:   Lymphoma    Craig David, MD, [facility] 09/03/2015 8:47 AM

## 2015-09-03 NOTE — Progress Notes (Signed)
[place] @ [facility] [phone]  Fax:([phone]) [place] OB: 02/25/1946  MR#: [id number]  BTD#:[id number]  Patient Care Team: Arlis Porta., MD as PCP - General (Family Medicine)  CHIEF COMPLAINT:  No chief complaint on file.   1. 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 Stage IIIa. 2. Finished a maintenance Rituxan in October of 2012. 3. PET scan in March 2015 shows complete remission. PET scan was done in November 2016 in  [place] prior to open heart surgery and was reported to be negative.   Oncology Flowsheet 10/17/2014 10/18/2014 10/19/2014  diazepam (VALIUM) PO - - 5 mg  enoxaparin (LOVENOX) [place] 40 mg 40 mg -    INTERVAL HISTORY:  69 year old gentleman was admitted in the hospital with contained perforated colon cancer.  Patient has been started on IV antibiotic resulting in to see improvement in soreness.  Patient complained of gas-like symptoms. Diarrhea has improved.  No nausea no vomiting patient has been started on low residue diet Which he has been tolerating well.  Appetite still poor.  Has been switched over to Cipro and Flagyl.  REVIEW OF SYSTEMS:   Gen. Status: Weakness has improved.  Abdominal pain is better.  Continues to 2 or 3 loose stool.  No nausea.  No vomiting.  No chills.  No fever. Mild abdominal distention. Diarrhea is improved. Complains of gas-like symptoms PAST MEDICAL HISTORY: Past Medical History  Diagnosis Date  . CAD (coronary artery disease)     3v  . Ischemic cardiomyopathy   . Chronic systolic heart failure   . HTN (hypertension)   . HLD (hyperlipidemia)   . Follicular lymphoma   . Myocardial infarction 1992    treated with thrombolytics/notes 10/16/2014    PAST SURGICAL HISTORY: Past Surgical History  Procedure Laterality Date  . Hernia repair    . Umbilical hernia repair  1964  . Coronary artery bypass  graft N/A 10/19/2014    Procedure: CORONARY ARTERY BYPASS GRAFTING (CABG);  Surgeon: Ivin Poot, MD;  Location: [place];  Service: Open Heart Surgery;  Laterality: N/A;  Times 3 using left internal mammary artery and endoscopically harvested left saphenous vein  . Intraoperative transesophageal echocardiogram N/A 10/19/2014    Procedure: INTRAOPERATIVE TRANSESOPHAGEAL ECHOCARDIOGRAM;  Surgeon: Ivin Poot, MD;  Location: [place];  Service: Open Heart Surgery;  Laterality: N/A;    FAMILY HISTORY Family History  Problem Relation Age of Onset  . Heart failure Mother     deceased  . Cirrhosis Father     deceased   Significant History/PMH:   Hypertension:    Hyperlipidemia:    Acute Myocardial Infarction:    CAD:    chf:    lymphoma:    Tobacco Use:    CABG: Nov 2015  PFSH: Comments: daughter also has lymphoma  Comments: positive for tobacco use,  30 years history of smokingdoes not drink  Additional Past Medical and Surgical History: hypocholesteremia   Hypertension   History of coronary artery disease   Gastroesophageal reflux disease    ADVANCED DIRECTIVES:  No flowsheet data found.  HEALTH MAINTENANCE: Social History  Substance Use Topics  . Smoking status: Former Smoker -- 1.00 packs/day for 50 years    Types: Cigarettes    Quit date: 09/24/2014  . Smokeless tobacco: Never Used  . Alcohol Use: No      No Known Allergies  Current  Facility-Administered Medications  Medication Dose Route Frequency Provider Last Rate Last Dose  . alum & mag hydroxide-simeth (MAALOX/MYLANTA) 200-200-20 MG/5ML suspension 15 mL  15 mL Oral Q4H PRN Forest Gleason, MD      . ciprofloxacin (CIPRO) tablet 750 mg  750 mg Oral BID Forest Gleason, MD   750 mg at 09/03/15 1610  . dextrose 5 % and 0.45 % NaCl with KCl 30 mEq/L infusion   Intravenous Continuous Forest Gleason, MD 10 mL/hr at 09/02/15 2055    . guaiFENesin-dextromethorphan (ROBITUSSIN DM) 100-10 MG/5ML syrup 10 mL  10 mL  Oral Q4H PRN Cammie Sickle, MD      . hydrocortisone (ANUSOL-HC) 2.5 % rectal cream 1 application  1 application Rectal BID PRN Cammie Sickle, MD      . metroNIDAZOLE (FLAGYL) tablet 500 mg  500 mg Oral 3 times per day Forest Gleason, MD   500 mg at 09/03/15 1508  . ondansetron (ZOFRAN) tablet 4-8 mg  4-8 mg Oral Q8H PRN Cammie Sickle, MD       Or  . ondansetron (ZOFRAN-ODT) disintegrating tablet 4-8 mg  4-8 mg Oral Q8H PRN Cammie Sickle, MD       Or  . ondansetron (ZOFRAN) injection 4 mg  4 mg Intravenous Q8H PRN Cammie Sickle, MD       Or  . ondansetron (ZOFRAN) 8 mg in sodium chloride 0.9 % 50 mL IVPB  8 mg Intravenous Q8H PRN Cammie Sickle, MD      . pantoprazole (PROTONIX) EC tablet 40 mg  40 mg Oral Daily Forest Gleason, MD   40 mg at 09/03/15 9604  . potassium chloride SA (K-DUR,KLOR-CON) CR tablet 20 mEq  20 mEq Oral BID Christene Lye, MD   20 mEq at 09/03/15 5409   Facility-Administered Medications Ordered in Other Encounters  Medication Dose Route Frequency Provider Last Rate Last Dose  . fludeoxyglucose F - 18 (FDG) injection 11.21 milli Curie  11.21 milli Curie Intravenous Once PRN Forest Gleason, MD   11.21 milli Curie at 08/29/15 1233    OBJECTIVE:  Filed Vitals:   09/03/15 1233  BP: 111/52  Pulse: 61  Temp: 98.4 F (36.9 C)  Resp: 16     Body mass index is 22.88 kg/(m^2).    ECOG FS:1 - Symptomatic but completely ambulatory  PHYSICAL EXAM: Gen. status: Patient is feeling week tired. Head exam was generally normal. There was no scleral icterus or corneal arcus. Mucous membranes were moist. Examination of the chest was unremarkable. There were no bony deformities, no asymmetry, and no other abnormalities. Cardiac: Tachycardia blood pressure is slightly low soft systolic murmur Abdomen: Mild distention.  And large spleen 6-7 cm below costal margin no ascites small sounds are present Lymphatic system: Small palpable lymph node  in the left inguinal area.  No other palpable lymphadenopathy Lower extremity trace edema Neurologically, the patient was awake, alert, and oriented to person, place and time. There were no obvious focal neurologic abnormalities. Examination of the skin revealed no evidence of significant rashes, suspicious appearing nevi or other concerning lesions.    LAB RESULTS:  CBC Latest Ref Rng 09/03/2015 09/02/2015  WBC 3.8 - 10.6 K/uL 17.6(H) 19.6(H)  Hemoglobin 13.0 - 18.0 g/dL 8.3(L) 8.4(L)  Hematocrit 40.0 - 52.0 % 26.4(L) 26.3(L)  Platelets 150 - 440 K/uL 224 230    Admission on 08/30/2015  Component Date Value Ref Range Status  . CEA 08/30/2015 16.9* 0.0 - 4.7 ng/mL  Final   Comment: (NOTE)       Roche ECLIA methodology       Nonsmokers  <3.9                                     Smokers     <5.6 Performed At: [facility] [place], [place] [id number] Lindon Romp MD HT:[id number]   . aPTT 08/30/2015 35  24 - 36 seconds Final  . WBC 08/30/2015 19.9* 3.8 - 10.6 K/uL Final  . RBC 08/30/2015 3.59* 4.40 - 5.90 MIL/uL Final  . Hemoglobin 08/30/2015 8.9* 13.0 - 18.0 g/dL Final  . HCT 08/30/2015 27.9* 40.0 - 52.0 % Final  . MCV 08/30/2015 77.6* 80.0 - 100.0 fL Final  . MCH 08/30/2015 24.7* 26.0 - 34.0 pg Final  . MCHC 08/30/2015 31.9* 32.0 - 36.0 g/dL Final  . RDW 08/30/2015 19.0* 11.5 - 14.5 % Final  . Platelets 08/30/2015 285  150 - 440 K/uL Final  . Neutrophils Relative % 08/30/2015 80   Final  . Neutro Abs 08/30/2015 16.1* 1.4 - 6.5 K/uL Final  . Lymphocytes Relative 08/30/2015 8   Final  . Lymphs Abs 08/30/2015 1.6  1.0 - 3.6 K/uL Final  . Monocytes Relative 08/30/2015 7   Final  . Monocytes Absolute 08/30/2015 1.3* 0.2 - 1.0 K/uL Final  . Eosinophils Relative 08/30/2015 4   Final  . Eosinophils Absolute 08/30/2015 0.7  0 - 0.7 K/uL Final  . Basophils Relative 08/30/2015 1   Final  . Basophils Absolute 08/30/2015 0.1  0 - 0.1 K/uL Final  . Sodium  08/30/2015 136  135 - 145 mmol/L Final  . Potassium 08/30/2015 3.5  3.5 - 5.1 mmol/L Final  . Chloride 08/30/2015 104  101 - 111 mmol/L Final  . CO2 08/30/2015 23  22 - 32 mmol/L Final  . Glucose, Bld 08/30/2015 90  65 - 99 mg/dL Final  . BUN 08/30/2015 16  6 - 20 mg/dL Final  . Creatinine, Ser 08/30/2015 0.93  0.61 - 1.24 mg/dL Final  . Calcium 08/30/2015 7.9* 8.9 - 10.3 mg/dL Final  . Total Protein 08/30/2015 5.0* 6.5 - 8.1 g/dL Final  . Albumin 08/30/2015 2.2* 3.5 - 5.0 g/dL Final  . AST 08/30/2015 12* 15 - 41 U/L Final  . ALT 08/30/2015 10* 17 - 63 U/L Final  . Alkaline Phosphatase 08/30/2015 138* 38 - 126 U/L Final  . Total Bilirubin 08/30/2015 1.0  0.3 - 1.2 mg/dL Final  . GFR calc non Af Amer 08/30/2015 >60  >60 mL/min Final  . GFR calc Af Amer 08/30/2015 >60  >60 mL/min Final   Comment: (NOTE) The eGFR has been calculated using the CKD EPI equation. This calculation has not been validated in all clinical situations. eGFR's persistently <60 mL/min signify possible Chronic Kidney Disease.   . Anion gap 08/30/2015 9  5 - 15 Final  . Prothrombin Time 08/30/2015 18.2* 11.4 - 15.0 seconds Final  . INR 08/30/2015 1.49   Final  . ABO/RH(D) 08/30/2015 O POS   Final  . Antibody Screen 08/30/2015 NEG   Final  . Sample Expiration 08/30/2015 09/02/2015   Final  . ABO/RH(D) 08/30/2015 O POS   Final  . Sodium 08/31/2015 139  135 - 145 mmol/L Final  . Potassium 08/31/2015 3.4* 3.5 - 5.1 mmol/L Final  . Chloride 08/31/2015 111  101 - 111 mmol/L Final  .  CO2 08/31/2015 24  22 - 32 mmol/L Final  . Glucose, Bld 08/31/2015 93  65 - 99 mg/dL Final  . BUN 08/31/2015 16  6 - 20 mg/dL Final  . Creatinine, Ser 08/31/2015 1.00  0.61 - 1.24 mg/dL Final  . Calcium 08/31/2015 7.6* 8.9 - 10.3 mg/dL Final  . GFR calc non Af Amer 08/31/2015 >60  >60 mL/min Final  . GFR calc Af Amer 08/31/2015 >60  >60 mL/min Final   Comment: (NOTE) The eGFR has been calculated using the CKD EPI equation. This  calculation has not been validated in all clinical situations. eGFR's persistently <60 mL/min signify possible Chronic Kidney Disease.   . Anion gap 08/31/2015 4* 5 - 15 Final  . WBC 08/31/2015 16.1* 3.8 - 10.6 K/uL Final  . RBC 08/31/2015 3.28* 4.40 - 5.90 MIL/uL Final  . Hemoglobin 08/31/2015 8.1* 13.0 - 18.0 g/dL Final  . HCT 08/31/2015 25.6* 40.0 - 52.0 % Final  . MCV 08/31/2015 77.9* 80.0 - 100.0 fL Final  . MCH 08/31/2015 24.7* 26.0 - 34.0 pg Final  . MCHC 08/31/2015 31.7* 32.0 - 36.0 g/dL Final  . RDW 08/31/2015 19.1* 11.5 - 14.5 % Final  . Platelets 08/31/2015 246  150 - 440 K/uL Final  . Neutrophils Relative % 08/31/2015 79   Final  . Neutro Abs 08/31/2015 12.8* 1.4 - 6.5 K/uL Final  . Lymphocytes Relative 08/31/2015 10   Final  . Lymphs Abs 08/31/2015 1.5  1.0 - 3.6 K/uL Final  . Monocytes Relative 08/31/2015 6   Final  . Monocytes Absolute 08/31/2015 0.9  0.2 - 1.0 K/uL Final  . Eosinophils Relative 08/31/2015 4   Final  . Eosinophils Absolute 08/31/2015 0.7  0 - 0.7 K/uL Final  . Basophils Relative 08/31/2015 1   Final  . Basophils Absolute 08/31/2015 0.1  0 - 0.1 K/uL Final  . WBC 09/01/2015 18.7* 3.8 - 10.6 K/uL Final  . RBC 09/01/2015 3.20* 4.40 - 5.90 MIL/uL Final  . Hemoglobin 09/01/2015 7.9* 13.0 - 18.0 g/dL Final  . HCT 09/01/2015 24.8* 40.0 - 52.0 % Final  . MCV 09/01/2015 77.6* 80.0 - 100.0 fL Final  . MCH 09/01/2015 24.8* 26.0 - 34.0 pg Final  . MCHC 09/01/2015 31.9* 32.0 - 36.0 g/dL Final  . RDW 09/01/2015 19.3* 11.5 - 14.5 % Final  . Platelets 09/01/2015 246  150 - 440 K/uL Final  . Neutrophils Relative % 09/01/2015 76   Final  . Neutro Abs 09/01/2015 14.4* 1.4 - 6.5 K/uL Final  . Lymphocytes Relative 09/01/2015 12   Final  . Lymphs Abs 09/01/2015 2.1  1.0 - 3.6 K/uL Final  . Monocytes Relative 09/01/2015 7   Final  . Monocytes Absolute 09/01/2015 1.3* 0.2 - 1.0 K/uL Final  . Eosinophils Relative 09/01/2015 4   Final  . Eosinophils Absolute 09/01/2015  0.8* 0 - 0.7 K/uL Final  . Basophils Relative 09/01/2015 1   Final  . Basophils Absolute 09/01/2015 0.1  0 - 0.1 K/uL Final  . Sodium 09/01/2015 137  135 - 145 mmol/L Final  . Potassium 09/01/2015 2.8* 3.5 - 5.1 mmol/L Final   CRITICAL RESULT CALLED TO, READ BACK BY AND VERIFIED WITH TAMARA CONYERS AT 3267 09/01/15.PMH  . Chloride 09/01/2015 110  101 - 111 mmol/L Final  . CO2 09/01/2015 21* 22 - 32 mmol/L Final  . Glucose, Bld 09/01/2015 104* 65 - 99 mg/dL Final  . BUN 09/01/2015 10  6 - 20 mg/dL Final  . Creatinine, Ser  09/01/2015 0.89  0.61 - 1.24 mg/dL Final  . Calcium 09/01/2015 7.3* 8.9 - 10.3 mg/dL Final  . GFR calc non Af Amer 09/01/2015 >60  >60 mL/min Final  . GFR calc Af Amer 09/01/2015 >60  >60 mL/min Final   Comment: (NOTE) The eGFR has been calculated using the CKD EPI equation. This calculation has not been validated in all clinical situations. eGFR's persistently <60 mL/min signify possible Chronic Kidney Disease.   . Anion gap 09/01/2015 6  5 - 15 Final  . WBC 09/02/2015 19.6* 3.8 - 10.6 K/uL Final  . RBC 09/02/2015 3.37* 4.40 - 5.90 MIL/uL Final  . Hemoglobin 09/02/2015 8.4* 13.0 - 18.0 g/dL Final  . HCT 09/02/2015 26.3* 40.0 - 52.0 % Final  . MCV 09/02/2015 78.1* 80.0 - 100.0 fL Final  . MCH 09/02/2015 25.0* 26.0 - 34.0 pg Final  . MCHC 09/02/2015 32.0  32.0 - 36.0 g/dL Final  . RDW 09/02/2015 19.4* 11.5 - 14.5 % Final  . Platelets 09/02/2015 230  150 - 440 K/uL Final  . Neutrophils Relative % 09/02/2015 76   Final  . Neutro Abs 09/02/2015 14.9* 1.4 - 6.5 K/uL Final  . Lymphocytes Relative 09/02/2015 11   Final  . Lymphs Abs 09/02/2015 2.2  1.0 - 3.6 K/uL Final  . Monocytes Relative 09/02/2015 7   Final  . Monocytes Absolute 09/02/2015 1.4* 0.2 - 1.0 K/uL Final  . Eosinophils Relative 09/02/2015 5   Final  . Eosinophils Absolute 09/02/2015 0.9* 0 - 0.7 K/uL Final  . Basophils Relative 09/02/2015 1   Final  . Basophils Absolute 09/02/2015 0.1  0 - 0.1 K/uL Final   . Potassium 09/02/2015 3.7  3.5 - 5.1 mmol/L Final  . WBC 09/03/2015 17.6* 3.8 - 10.6 K/uL Final  . RBC 09/03/2015 3.38* 4.40 - 5.90 MIL/uL Final  . Hemoglobin 09/03/2015 8.3* 13.0 - 18.0 g/dL Final  . HCT 09/03/2015 26.4* 40.0 - 52.0 % Final  . MCV 09/03/2015 78.1* 80.0 - 100.0 fL Final  . MCH 09/03/2015 24.6* 26.0 - 34.0 pg Final  . MCHC 09/03/2015 31.5* 32.0 - 36.0 g/dL Final  . RDW 09/03/2015 19.5* 11.5 - 14.5 % Final  . Platelets 09/03/2015 224  150 - 440 K/uL Final  . Neutrophils Relative % 09/03/2015 77   Final  . Neutro Abs 09/03/2015 13.5* 1.4 - 6.5 K/uL Final  . Lymphocytes Relative 09/03/2015 10   Final  . Lymphs Abs 09/03/2015 1.7  1.0 - 3.6 K/uL Final  . Monocytes Relative 09/03/2015 8   Final  . Monocytes Absolute 09/03/2015 1.4* 0.2 - 1.0 K/uL Final  . Eosinophils Relative 09/03/2015 5   Final  . Eosinophils Absolute 09/03/2015 0.9* 0 - 0.7 K/uL Final  . Basophils Relative 09/03/2015 0   Final  . Basophils Absolute 09/03/2015 0.1  0 - 0.1 K/uL Final  . Sodium 09/03/2015 139  135 - 145 mmol/L Final  . Potassium 09/03/2015 4.4  3.5 - 5.1 mmol/L Final  . Chloride 09/03/2015 112* 101 - 111 mmol/L Final  . CO2 09/03/2015 27  22 - 32 mmol/L Final  . Glucose, Bld 09/03/2015 98  65 - 99 mg/dL Final  . BUN 09/03/2015 5* 6 - 20 mg/dL Final  . Creatinine, Ser 09/03/2015 0.93  0.61 - 1.24 mg/dL Final  . Calcium 09/03/2015 7.6* 8.9 - 10.3 mg/dL Final  . Total Protein 09/03/2015 4.4* 6.5 - 8.1 g/dL Final  . Albumin 09/03/2015 2.0* 3.5 - 5.0 g/dL Final  . AST  09/03/2015 11* 15 - 41 U/L Final  . ALT 09/03/2015 8* 17 - 63 U/L Final  . Alkaline Phosphatase 09/03/2015 130* 38 - 126 U/L Final  . Total Bilirubin 09/03/2015 1.1  0.3 - 1.2 mg/dL Final  . GFR calc non Af Amer 09/03/2015 >60  >60 mL/min Final  . GFR calc Af Amer 09/03/2015 >60  >60 mL/min Final   Comment: (NOTE) The eGFR has been calculated using the CKD EPI equation. This calculation has not been validated in all  clinical situations. eGFR's persistently <60 mL/min signify possible Chronic Kidney Disease.   . Anion gap 09/03/2015 0* 5 - 15 Final        ASSESSMENT: Perforated colon cancer which is contained patient is responding to IV antibiotics.  Patient has been switched over to Cipro and Flagyl Discussed situation with the arm Dr. Gevena Mart  for possible surgery next week. If tolerated diet very well then patient will be discharged tomorrow morning Patient is making progress with ambulation Cardiologist note has been reviewed patient will get an echocardiogram done on September 30. Leukocytosis persist   Out of bed and ambulate Repeat blood count and chemistry tomorrow Discontinue IV by Earleen Reaper, MD   09/03/2015 7:43 PM

## 2015-09-03 NOTE — Progress Notes (Signed)
Patient ID: Craig Page, male   DOB: 1946/05/13, 69 y.o.   MRN: [id number] No complaints. Pain is markedly diinished. AVSS. Discussed plan with pt and Dr. Oliva Bustard. He can be discharged on oral antibiotics later today or tomorrow am.  Needs to be on low residue diet.

## 2015-09-04 ENCOUNTER — Telehealth: Payer: Self-pay | Admitting: Family Medicine

## 2015-09-04 ENCOUNTER — Other Ambulatory Visit: Payer: Self-pay | Admitting: *Deleted

## 2015-09-04 ENCOUNTER — Encounter: Payer: Self-pay | Admitting: *Deleted

## 2015-09-04 ENCOUNTER — Ambulatory Visit: Payer: Self-pay

## 2015-09-04 DIAGNOSIS — C8590 Non-Hodgkin lymphoma, unspecified, unspecified site: Secondary | ICD-10-CM

## 2015-09-04 LAB — GLUCOSE, CAPILLARY: Glucose-Capillary: 90 mg/dL (ref 65–99)

## 2015-09-04 MED ORDER — METRONIDAZOLE 500 MG PO TABS: 500.0000 mg | ORAL_TABLET | Freq: Three times a day (TID) | ORAL | Status: DC

## 2015-09-04 MED ORDER — ONDANSETRON HCL 4 MG PO TABS: 4.0000 mg | ORAL_TABLET | Freq: Three times a day (TID) | ORAL | Status: AC | PRN

## 2015-09-04 MED ORDER — PROMETHAZINE HCL 25 MG PO TABS: 25.0000 mg | ORAL_TABLET | Freq: Four times a day (QID) | ORAL | Status: DC | PRN

## 2015-09-04 MED ORDER — CIPROFLOXACIN HCL 500 MG PO TABS: 500.0000 mg | ORAL_TABLET | Freq: Two times a day (BID) | ORAL | Status: DC

## 2015-09-04 NOTE — Discharge Summary (Signed)
Physician Discharge Summary  Patient ID: Craig Page MRN: [id number] DOB/AGE: 1946/10/09 69 y.o.  Admit date: 08/30/2015 Discharge date: 09/04/2015  Admission Diagnoses:  Discharge Diagnoses:  Active Problems:   Lymphoma   Discharged Condition: fair  Hospital Course: During hospital stay patient was started on IV antibiotics had a CT scan done evaluated by surgeon  Consults: Surgical consult Cardiology consult  Significant Diagnostic Studies: CT scan of abdomen and pelvis 182 Treatments: IV antibiotic  Discharge Exam: Blood pressure 105/53, pulse 61, temperature 98 F (36.7 C), temperature source Oral, resp. rate 17, height 5\' 11"  (1.803 m), weight 164 lb (74.39 kg), SpO2 97 %.   Patient was alert and oriented.  Abdominal distention.  Palpable masses decreased in size.  Cardiac: Irregular heart sounds soft systolic murmur Disposition: 01-Home or Self Care     Medication List    STOP taking these medications        simethicone 80 MG chewable tablet  Commonly known as:  MYLICON      TAKE these medications        amiodarone 200 MG tablet  Commonly known as:  PACERONE  Take 1 tablet (200 mg total) by mouth daily.     carvedilol 3.125 MG tablet  Commonly known as:  COREG  TAKE 1 TABLET BY MOUTH TWICE DAILY.     ciprofloxacin 500 MG tablet  Commonly known as:  CIPRO  Take 1 tablet (500 mg total) by mouth 2 (two) times daily.     lisinopril 2.5 MG tablet  Commonly known as:  PRINIVIL,ZESTRIL  TAKE 1 TABLET BY MOUTH TWICE DAILY     metroNIDAZOLE 500 MG tablet  Commonly known as:  FLAGYL  Take 1 tablet (500 mg total) by mouth every 8 (eight) hours.     ondansetron 4 MG tablet  Commonly known as:  ZOFRAN  Take 1-2 tablets (4-8 mg total) by mouth every 8 (eight) hours as needed for nausea (not responsive to prochlorperazine (COMPAZINE)).     pantoprazole 40 MG tablet  Commonly known as:  PROTONIX  Take 40 mg by mouth daily.     promethazine 25 MG tablet   Commonly known as:  PHENERGAN  Take 1 tablet (25 mg total) by mouth every 6 (six) hours as needed for nausea or vomiting.     spironolactone 25 MG tablet  Commonly known as:  ALDACTONE  Take 0.5 tablets (12.5 mg total) by mouth daily.           Follow-up Information    Follow up with Christene Lye, MD. Go on 09/11/2015.   Specialties:  General Surgery, Radiology   Why:  Wednesday at 9:45am for hospital follow-up and treatment planning   Contact information:   [address] [place] [address] [phone]       Signed: Forest Gleason 09/04/2015, 9:36 AM

## 2015-09-04 NOTE — Progress Notes (Signed)
Pt d/c home; d/c instructions reviewed w/ pt; pt understanding was verbalized; IV removed catheter in tact, gauze dressing applied; all pt questions answered; pt left unit via wheelchair accompanied by staff 

## 2015-09-04 NOTE — Progress Notes (Signed)
   SUBJECTIVE: pt c/o mild abdominal pain today after eating breakfast. No CP or SOB.    Filed Vitals:   09/03/15 0532 09/03/15 1233 09/03/15 2240 09/04/15 0620  BP: 104/49 111/52 130/49 105/53  Pulse: 55 61 59 61  Temp: 98.1 F (36.7 C) 98.4 F (36.9 C) 97.8 F (36.6 C) 98 F (36.7 C)  TempSrc: Oral Oral Oral Oral  Resp: 16 16  17   Height:      Weight:      SpO2: 96% 97% 96% 97%    Intake/Output Summary (Last 24 hours) at 09/04/15 1033 Last data filed at 09/04/15 0857  Gross per 24 hour  Intake   1778 ml  Output   1825 ml  Net    -47 ml    LABS: Basic Metabolic Panel:  Recent Labs  09/02/15 0636 09/03/15 0448  NA  --  139  K 3.7 4.4  CL  --  112*  CO2  --  27  GLUCOSE  --  98  BUN  --  5*  CREATININE  --  0.93  CALCIUM  --  7.6*   Liver Function Tests:  Recent Labs  09/03/15 0448  AST 11*  ALT 8*  ALKPHOS 130*  BILITOT 1.1  PROT 4.4*  ALBUMIN 2.0*   No results for input(s): LIPASE, AMYLASE in the last 72 hours. CBC:  Recent Labs  09/02/15 0636 09/03/15 0448  WBC 19.6* 17.6*  NEUTROABS 14.9* 13.5*  HGB 8.4* 8.3*  HCT 26.3* 26.4*  MCV 78.1* 78.1*  PLT 230 224   Cardiac Enzymes: No results for input(s): CKTOTAL, CKMB, CKMBINDEX, TROPONINI in the last 72 hours. BNP: Invalid input(s): POCBNP D-Dimer: No results for input(s): DDIMER in the last 72 hours. Hemoglobin A1C: No results for input(s): HGBA1C in the last 72 hours. Fasting Lipid Panel: No results for input(s): CHOL, HDL, LDLCALC, TRIG, CHOLHDL, LDLDIRECT in the last 72 hours. Thyroid Function Tests: No results for input(s): TSH, T4TOTAL, T3FREE, THYROIDAB in the last 72 hours.  Invalid input(s): FREET3 Anemia Panel: No results for input(s): VITAMINB12, FOLATE, FERRITIN, TIBC, IRON, RETICCTPCT in the last 72 hours.   PHYSICAL EXAM General: Well developed, well nourished, in no acute distress HEENT: Normocephalic and atramatic Neck: No JVD.  Lungs: Clear bilaterally to  auscultation and percussion. Heart: HRRR . Normal S1 and S2 without gallops or murmurs.  Abdomen: Bowel sounds are positive, mass LUQ Msk: Back normal, normal gait. Normal strength and tone for age. Extremities: No clubbing, cyanosis or edema.  Neuro: Alert and oriented X 3. Psych: Good affect, responds appropriately  TELEMETRY: Reviewed telemetry pt in NSR:  ASSESSMENT AND PLAN: Low-mod risk for surgery if indicated. Pt given f/u in our office on Friday 9/30. Will recheck echo prior to surgery.   Patient and plan discussed with supervising provider, Dr. Neoma Laming, who agrees with above findings.   Kelby Fam [place], [place]  09/04/2015 10:33 AM

## 2015-09-04 NOTE — Discharge Summary (Signed)
Physician Discharge Summary  Patient ID: Craig Page MRN: [id number] DOB/AGE: [birth year] 69 y.o.  Admit date: 08/30/2015 Discharge date: 09/04/2015  Admission Diagnoses: Perforated: Splenic flexure. Follicular lymphoma Leukocytosis Ischemic cardiomyopathy Hypertension  Discharge Diagnoses:  Perforated  Colon  Mass in the splenic flexor  Discharged Condition: [facility] Course:   During hospital course patient was started on IV antibiotics and liquid diet.  Patient underwent evaluation by surgeon Dr. Jamal Collin as well as some cardiac consultation. Gradually pain improved.  Patient started taking low residual diet.  Patient continued to have problem with epigastric discomfort.  Had diarrhea which is resolved.  Patient was ambulating well.  Consults: general surgery  Cardiologist  Significant Diagnostic Studies: {CT scan of abdomen and plain x-rays  Treatments:  IV antibiotics  Discharge Exam: Blood pressure 105/53, pulse 61, temperature 98 F (36.7 C), temperature source Oral, resp. rate 17, height 5\' 11"  (1.803 m), weight 164 lb (74.39 kg), SpO2 97 %.  Abdomen was mildly distended bowel sounds are present in left upper quadrant pain is improved.  Patient was ambulating well. Follow-up appointment has been made to see Dr. Jamal Collin and cardiologist for echocardiogram and me for further evaluation. Disposition: 01-Home or Self Care  Patient was advised to call me if continues to have high fever or more abdominal discomfort or nausea or vomiting. Continue antibiotic. Discharge time was more than 30 minutes     Medication List    STOP taking these medications        simethicone 80 MG chewable tablet  Commonly known as:  MYLICON      TAKE these medications        amiodarone 200 MG tablet  Commonly known as:  PACERONE  Take 1 tablet (200 mg total) by mouth daily.     carvedilol 3.125 MG tablet  Commonly known as:  COREG  TAKE 1 TABLET BY MOUTH TWICE DAILY.     ciprofloxacin 500 MG tablet  Commonly known as:  CIPRO  Take 1 tablet (500 mg total) by mouth 2 (two) times daily.     lisinopril 2.5 MG tablet  Commonly known as:  PRINIVIL,ZESTRIL  TAKE 1 TABLET BY MOUTH TWICE DAILY     metroNIDAZOLE 500 MG tablet  Commonly known as:  FLAGYL  Take 1 tablet (500 mg total) by mouth every 8 (eight) hours.     ondansetron 4 MG tablet  Commonly known as:  ZOFRAN  Take 1-2 tablets (4-8 mg total) by mouth every 8 (eight) hours as needed for nausea (not responsive to prochlorperazine (COMPAZINE)).     pantoprazole 40 MG tablet  Commonly known as:  PROTONIX  Take 40 mg by mouth daily.     promethazine 25 MG tablet  Commonly known as:  PHENERGAN  Take 1 tablet (25 mg total) by mouth every 6 (six) hours as needed for nausea or vomiting.     spironolactone 25 MG tablet  Commonly known as:  ALDACTONE  Take 0.5 tablets (12.5 mg total) by mouth daily.           Follow-up Information    Follow up with Christene Lye, MD. Go on 09/11/2015.   Specialties:  General Surgery, Radiology   Why:  Wednesday at 9:45am for hospital follow-up and treatment planning   Contact information:   [place] [address] [phone]       Follow up with Forest Gleason, MD. Go on 09/09/2015.   Specialty:  Oncology   Why:  Monday  at 11:00am for hospital follow-up.   Contact information:   [address] [place]  [address] [phone]       Follow up with Dionisio David, MD. Go on 09/06/2015.   Specialty:  Cardiology   Why:  Friday at 9:45am for hospital follow-up.   Contact information:   [place] [address] [phone]       Signed: Forest Gleason 09/04/2015, 11:52 AM

## 2015-09-04 NOTE — Care Management Important Message (Signed)
Important Message  Patient Details  Name: Craig Page MRN: [id number] Date of Birth: September 03, 1946   Medicare Important Message Given:  Yes-third notification given    [name] 09/04/2015, 9:52 AM

## 2015-09-04 NOTE — Telephone Encounter (Signed)
Auth:-  NPI  # [id number] - Group # [id number]

## 2015-09-04 NOTE — Telephone Encounter (Signed)
Craig Staggers # [id number] Called Dr. Angie Fava office and spoke to [place].

## 2015-09-04 NOTE — Discharge Instructions (Signed)
Follow all MD discharge instructions. Take all medications as prescribed. Keep all follow up appointments. If your symptoms return, or if you experience any new symptoms that are of concern to you or that are bothersome to you, call your doctor. For all questions and/or concerns, call your doctor.   If you have a medical emergency, call 911.

## 2015-09-04 NOTE — Telephone Encounter (Signed)
Can you please verify if this is a referral or auth for [facility]? If he has appt already it may just need the Auth in [place]. Also I need NPI number for this. Thanks.

## 2015-09-04 NOTE — Telephone Encounter (Signed)
Pt need a referral for  Dr. Jamal Collin  [phone])   Pt appt . Oct 5th

## 2015-09-06 ENCOUNTER — Telehealth: Payer: Self-pay | Admitting: *Deleted

## 2015-09-06 ENCOUNTER — Encounter: Payer: Self-pay | Admitting: *Deleted

## 2015-09-06 ENCOUNTER — Emergency Department
Admission: EM | Admit: 2015-09-06 | Discharge: 2015-09-06 | Disposition: A | Discharge status: Home / Self Care | Payer: Commercial Managed Care - HMO | Source: Home / Self Care | Attending: Emergency Medicine | Admitting: Emergency Medicine

## 2015-09-06 DIAGNOSIS — E860 Dehydration: Secondary | ICD-10-CM

## 2015-09-06 DIAGNOSIS — C189 Malignant neoplasm of colon, unspecified: Secondary | ICD-10-CM | POA: Diagnosis not present

## 2015-09-06 LAB — CBC
HEMATOCRIT: 30.5 % — AB (ref 40.0–52.0)
Hemoglobin: 9.8 g/dL — ABNORMAL LOW (ref 13.0–18.0)
MCH: 25.3 pg — ABNORMAL LOW (ref 26.0–34.0)
MCHC: 32 g/dL (ref 32.0–36.0)
MCV: 79.1 fL — ABNORMAL LOW (ref 80.0–100.0)
PLATELETS: 226 10*3/uL (ref 150–440)
RBC: 3.85 MIL/uL — ABNORMAL LOW (ref 4.40–5.90)
RDW: 20.6 % — AB (ref 11.5–14.5)
WBC: 20.8 10*3/uL — AB (ref 3.8–10.6)

## 2015-09-06 LAB — COMPREHENSIVE METABOLIC PANEL
ALBUMIN: 2.4 g/dL — AB (ref 3.5–5.0)
ALT: 8 U/L — ABNORMAL LOW (ref 17–63)
AST: 15 U/L (ref 15–41)
Alkaline Phosphatase: 131 U/L — ABNORMAL HIGH (ref 38–126)
Anion gap: 6 (ref 5–15)
BUN: 11 mg/dL (ref 6–20)
CHLORIDE: 106 mmol/L (ref 101–111)
CO2: 25 mmol/L (ref 22–32)
Calcium: 7.6 mg/dL — ABNORMAL LOW (ref 8.9–10.3)
Creatinine, Ser: 1.25 mg/dL — ABNORMAL HIGH (ref 0.61–1.24)
GFR calc Af Amer: >60 mL/min (ref >60)
GFR calc non Af Amer: 57 mL/min — ABNORMAL LOW (ref >60)
GLUCOSE: 110 mg/dL — AB (ref 65–99)
POTASSIUM: 4 mmol/L (ref 3.5–5.1)
SODIUM: 137 mmol/L (ref 135–145)
Total Bilirubin: 0.8 mg/dL (ref 0.3–1.2)
Total Protein: 4.8 g/dL — ABNORMAL LOW (ref 6.5–8.1)

## 2015-09-06 LAB — TROPONIN I: Troponin I: <0.03 ng/mL (ref <0.031)

## 2015-09-06 LAB — LIPASE, BLOOD: Lipase: 11 U/L — ABNORMAL LOW (ref 22–51)

## 2015-09-06 MED ORDER — SODIUM CHLORIDE 0.9 % IV SOLN
1000.0000 mL | Freq: Once | INTRAVENOUS | Status: AC
  Administered 2015-09-06: 1000 mL via INTRAVENOUS

## 2015-09-06 NOTE — ED Provider Notes (Signed)
[facility] Emergency Department Provider Note  ____________________________________________  Time seen: 2 PM  I have reviewed the triage vital signs and the nursing notes.   HISTORY  Chief Complaint Hypotension    HPI Craig Page is a 69 y.o. male who was sent by his physician because of low blood pressure. He denies chest pain he has felt slightly weak. He was recently hospitalizedfor perforated colon likely secondary to the cancer. He has excellent outpatient follow-up and reports his abdominal pain has improved significantly he does continue to have some mild diarrhea. Does report decreased by mouth intake. He denies fevers chills. No shortness of breath.     Past Medical History  Diagnosis Date  . CAD (coronary artery disease)     3v  . Ischemic cardiomyopathy   . Chronic systolic heart failure   . HTN (hypertension)   . HLD (hyperlipidemia)   . Follicular lymphoma   . Myocardial infarction 1992    treated with thrombolytics/notes 10/16/2014    Patient Active Problem List   Diagnosis Date Noted  . Bowel perforation 08/30/2015  . Iron deficiency anemia 08/30/2015  . Lymphoma 08/30/2015  . Chronic systolic heart failure 51/76/1607  . HTN (hypertension) 10/31/2014  . PAD (peripheral artery disease) 10/31/2014  . S/P CABG x 3   . CAD (coronary artery disease) 10/19/2014  . Coronary artery disease 10/16/2014  . Ischemic cardiomyopathy 10/16/2014    Past Surgical History  Procedure Laterality Date  . Hernia repair    . Umbilical hernia repair  1964  . Coronary artery bypass graft N/A 10/19/2014    Procedure: CORONARY ARTERY BYPASS GRAFTING (CABG);  Surgeon: Ivin Poot, MD;  Location: [facility];  Service: Open Heart Surgery;  Laterality: N/A;  Times 3 using left internal mammary artery and endoscopically harvested left saphenous vein  . Intraoperative transesophageal echocardiogram N/A 10/19/2014    Procedure: INTRAOPERATIVE  TRANSESOPHAGEAL ECHOCARDIOGRAM;  Surgeon: Ivin Poot, MD;  Location: [facility];  Service: Open Heart Surgery;  Laterality: N/A;    Current Outpatient Rx  Name  Route  Sig  Dispense  Refill  . amiodarone (PACERONE) 200 MG tablet   Oral   Take 1 tablet (200 mg total) by mouth daily. Patient not taking: Reported on 08/30/2015   30 tablet   1   . carvedilol (COREG) 3.125 MG tablet      TAKE 1 TABLET BY MOUTH TWICE DAILY.   180 tablet   4     **Patient requests 90 days supply**   . ciprofloxacin (CIPRO) 500 MG tablet   Oral   Take 1 tablet (500 mg total) by mouth 2 (two) times daily.   20 tablet   0   . lisinopril (PRINIVIL,ZESTRIL) 2.5 MG tablet      TAKE 1 TABLET BY MOUTH TWICE DAILY   180 tablet   0     **Patient requests 90 days supply**   . metroNIDAZOLE (FLAGYL) 500 MG tablet   Oral   Take 1 tablet (500 mg total) by mouth every 8 (eight) hours.   30 tablet   0   . ondansetron (ZOFRAN) 4 MG tablet   Oral   Take 1-2 tablets (4-8 mg total) by mouth every 8 (eight) hours as needed for nausea (not responsive to prochlorperazine (COMPAZINE)).   20 tablet   0   . pantoprazole (PROTONIX) 40 MG tablet   Oral   Take 40 mg by mouth daily.         Marland Kitchen  promethazine (PHENERGAN) 25 MG tablet   Oral   Take 1 tablet (25 mg total) by mouth every 6 (six) hours as needed for nausea or vomiting.   30 tablet   0   . spironolactone (ALDACTONE) 25 MG tablet   Oral   Take 0.5 tablets (12.5 mg total) by mouth daily. Patient not taking: Reported on 08/30/2015   45 tablet   3     Does not need refill currently. Please file.     Allergies Review of patient's allergies indicates no known allergies.  Family History  Problem Relation Age of Onset  . Heart failure Mother     deceased  . Cirrhosis Father     deceased    Social History Social History  Substance Use Topics  . Smoking status: Former Smoker -- 1.00 packs/day for 50 years    Types: Cigarettes    Quit date:  09/24/2014  . Smokeless tobacco: Never Used  . Alcohol Use: No    Review of Systems  Constitutional: Negative for fever. Eyes: Negative for visual changes. ENT: Negative for sore throat Cardiovascular: Negative for chest pain. Respiratory: Negative for shortness of breath. Gastrointestinal: Negative for abdominal pain, vomiting , positive for diarrhea. Genitourinary: Negative for dysuria. Musculoskeletal: Negative for back pain. Skin: Negative for rash. Neurological: Negative for headaches or focal weakness Psychiatric: No anxiety    ____________________________________________   PHYSICAL EXAM:  VITAL SIGNS: ED Triage Vitals  Enc Vitals Group     BP 09/06/15 1136 104/54 mmHg     Pulse Rate 09/06/15 1136 63     Resp 09/06/15 1136 16     Temp 09/06/15 1136 97.6 F (36.4 C)     Temp Source 09/06/15 1136 Oral     SpO2 09/06/15 1136 100 %     Weight 09/06/15 1136 164 lb (74.39 kg)     Height 09/06/15 1136 5\' 11"  (1.803 m)     Head Cir --      Peak Flow --      Pain Score --      Pain Loc --      Pain Edu? --      Excl. in [place]? --      Constitutional: Alert and oriented. Well appearing and in no distress. Pleasant and interactive Eyes: Conjunctivae are normal.  ENT   Head: Normocephalic and atraumatic.   Mouth/Throat: Mucous membranes are moist. Cardiovascular: Normal rate, regular rhythm. Normal and symmetric distal pulses are present in all extremities. No murmurs, rubs, or gallops. Respiratory: Normal respiratory effort without tachypnea nor retractions. Breath sounds are clear and equal bilaterally.  Gastrointestinal: Soft and non-tender in all quadrants. No distention. There is no CVA tenderness. Genitourinary: deferred Musculoskeletal: Nontender with normal range of motion in all extremities. No lower extremity tenderness nor edema. Neurologic:  Normal speech and language. No gross focal neurologic deficits are appreciated. Skin:  Skin is warm, dry and  intact. No rash noted. Psychiatric: Mood and affect are normal. Patient exhibits appropriate insight and judgment.  ____________________________________________    LABS (pertinent positives/negatives)  Labs Reviewed  CBC - Abnormal; Notable for the following:    WBC 20.8 (*)    RBC 3.85 (*)    Hemoglobin 9.8 (*)    HCT 30.5 (*)    MCV 79.1 (*)    MCH 25.3 (*)    RDW 20.6 (*)    All other components within normal limits  COMPREHENSIVE METABOLIC PANEL - Abnormal; Notable for the following:  Glucose, Bld 110 (*)    Creatinine, Ser 1.25 (*)    Calcium 7.6 (*)    Total Protein 4.8 (*)    Albumin 2.4 (*)    ALT 8 (*)    Alkaline Phosphatase 131 (*)    GFR calc non Af Amer 57 (*)    All other components within normal limits  LIPASE, BLOOD - Abnormal; Notable for the following:    Lipase 11 (*)    All other components within normal limits  TROPONIN I    ____________________________________________   EKG  Unchanged from prior  ____________________________________________    RADIOLOGY I have personally reviewed any xrays that were ordered on this patient: None  ____________________________________________   PROCEDURES  Procedure(s) performed: none  Critical Care performed: none  ____________________________________________   INITIAL IMPRESSION / ASSESSMENT AND PLAN / ED COURSE  Pertinent labs & imaging results that were available during my care of the patient were reviewed by me and considered in my medical decision making (see chart for details).  Patient well-appearing and in no distress. Initially his blood pressure has been in the 58I systolic. His blood work is suggestive of mild dehydration. He feels very well and denies chest pain or shortness of breath or abdominal pain. He has had diarrhea as well and admits to decreased by mouth intake. I suspect he is dehydrated which is the cause of his low blood pressure. He no longer feels dizzy or weak in the  emergency department  ----------------------------------------- 3:36 PM on 09/06/2015 -----------------------------------------  After fluids I performed orthostatics. Standing his blood pressure was 128/70 with a heart rate of 65. While lying down his heart rate was 70 and his blood pressure was 120/65. He had no dizziness with standing reports he feels well and agrees with plan of discharge. He agrees to return to the emergency department with dizziness returns or if he has chest pain and dizziness worsening pain or fevers  ____________________________________________   FINAL CLINICAL IMPRESSION(S) / ED DIAGNOSES  Final diagnoses:  Dehydration     Lavonia Drafts, MD 09/06/15 1538

## 2015-09-06 NOTE — Telephone Encounter (Signed)
Patient has been readmitted to hospital for hypotension per his cardiologist. Wanted to let you know

## 2015-09-06 NOTE — ED Notes (Signed)
Pt reports sent by Dr. Humphrey Rolls for hypotension. States discharged from hospital 2 days ago for cancer. Denies pain. Reports generalized weakness.

## 2015-09-06 NOTE — ED Notes (Signed)
Pt here for low BP per follow up for possible colon CA, pt in no apparent distress.

## 2015-09-06 NOTE — Discharge Instructions (Signed)
Dehydration, Adult Dehydration is when you lose more fluids from the body than you take in. Vital organs like the kidneys, brain, and heart cannot function without a proper amount of fluids and salt. Any loss of fluids from the body can cause dehydration.  CAUSES   Vomiting.  Diarrhea.  Excessive sweating.  Excessive urine output.  Fever. SYMPTOMS  Mild dehydration  Thirst.  Dry lips.  Slightly dry mouth. Moderate dehydration  Very dry mouth.  Sunken eyes.  Skin does not bounce back quickly when lightly pinched and released.  Dark urine and decreased urine production.  Decreased tear production.  Headache. Severe dehydration  Very dry mouth.  Extreme thirst.  Rapid, weak pulse (more than 100 beats per minute at rest).  Cold hands and feet.  Not able to sweat in spite of heat and temperature.  Rapid breathing.  Blue lips.  Confusion and lethargy.  Difficulty being awakened.  Minimal urine production.  No tears. DIAGNOSIS  Your caregiver will diagnose dehydration based on your symptoms and your exam. Blood and urine tests will help confirm the diagnosis. The diagnostic evaluation should also identify the cause of dehydration. TREATMENT  Treatment of mild or moderate dehydration can often be done at home by increasing the amount of fluids that you drink. It is best to drink small amounts of fluid more often. Drinking too much at one time can make vomiting worse. Refer to the home care instructions below. Severe dehydration needs to be treated at the hospital where you will probably be given intravenous (IV) fluids that contain water and electrolytes. HOME CARE INSTRUCTIONS   Ask your caregiver about specific rehydration instructions.  Drink enough fluids to keep your urine clear or pale yellow.  Drink small amounts frequently if you have nausea and vomiting.  Eat as you normally do.  Avoid:  Foods or drinks high in sugar.  Carbonated  drinks.  Juice.  Extremely hot or cold fluids.  Drinks with caffeine.  Fatty, greasy foods.  Alcohol.  Tobacco.  Overeating.  Gelatin desserts.  Wash your hands well to avoid spreading bacteria and viruses.  Only take over-the-counter or prescription medicines for pain, discomfort, or fever as directed by your caregiver.  Ask your caregiver if you should continue all prescribed and over-the-counter medicines.  Keep all follow-up appointments with your caregiver. SEEK MEDICAL CARE IF:  You have abdominal pain and it increases or stays in one area (localizes).  You have a rash, stiff neck, or severe headache.  You are irritable, sleepy, or difficult to awaken.  You are weak, dizzy, or extremely thirsty. SEEK IMMEDIATE MEDICAL CARE IF:   You are unable to keep fluids down or you get worse despite treatment.  You have frequent episodes of vomiting or diarrhea.  You have blood or green matter (bile) in your vomit.  You have blood in your stool or your stool looks black and tarry.  You have not urinated in 6 to 8 hours, or you have only urinated a small amount of very dark urine.  You have a fever.  You faint. MAKE SURE YOU:   Understand these instructions.  Will watch your condition.  Will get help right away if you are not doing well or get worse. Document Released: 11/23/2005 Document Revised: 02/15/2012 Document Reviewed: 07/13/2011 Exit[employer] Patient Information 2015 [employer]. This information is not intended to replace advice given to you by your health care provider. Make sure you discuss any questions you have with your health care   provider.  

## 2015-09-09 ENCOUNTER — Inpatient Hospital Stay: Payer: Commercial Managed Care - HMO

## 2015-09-09 ENCOUNTER — Inpatient Hospital Stay
Admission: AD | Admit: 2015-09-09 | Discharge: 2015-09-24 | DRG: 329 | Disposition: A | Discharge status: Skilled Nursing Facility | Payer: Commercial Managed Care - HMO | Source: Ambulatory Visit | Attending: Oncology | Admitting: Oncology

## 2015-09-09 ENCOUNTER — Inpatient Hospital Stay: Payer: Commercial Managed Care - HMO | Admitting: Oncology

## 2015-09-09 VITALS — BP 102/66 | HR 56 | Resp 18

## 2015-09-09 DIAGNOSIS — E785 Hyperlipidemia, unspecified: Secondary | ICD-10-CM | POA: Diagnosis present

## 2015-09-09 DIAGNOSIS — I2510 Atherosclerotic heart disease of native coronary artery without angina pectoris: Secondary | ICD-10-CM | POA: Diagnosis present

## 2015-09-09 DIAGNOSIS — N179 Acute kidney failure, unspecified: Secondary | ICD-10-CM | POA: Diagnosis not present

## 2015-09-09 DIAGNOSIS — I252 Old myocardial infarction: Secondary | ICD-10-CM

## 2015-09-09 DIAGNOSIS — Z8249 Family history of ischemic heart disease and other diseases of the circulatory system: Secondary | ICD-10-CM | POA: Diagnosis not present

## 2015-09-09 DIAGNOSIS — D638 Anemia in other chronic diseases classified elsewhere: Secondary | ICD-10-CM | POA: Diagnosis present

## 2015-09-09 DIAGNOSIS — C8590 Non-Hodgkin lymphoma, unspecified, unspecified site: Secondary | ICD-10-CM

## 2015-09-09 DIAGNOSIS — D72829 Elevated white blood cell count, unspecified: Secondary | ICD-10-CM | POA: Diagnosis present

## 2015-09-09 DIAGNOSIS — R0902 Hypoxemia: Secondary | ICD-10-CM

## 2015-09-09 DIAGNOSIS — D509 Iron deficiency anemia, unspecified: Secondary | ICD-10-CM

## 2015-09-09 DIAGNOSIS — C786 Secondary malignant neoplasm of retroperitoneum and peritoneum: Secondary | ICD-10-CM | POA: Diagnosis present

## 2015-09-09 DIAGNOSIS — Z951 Presence of aortocoronary bypass graft: Secondary | ICD-10-CM

## 2015-09-09 DIAGNOSIS — R188 Other ascites: Secondary | ICD-10-CM | POA: Diagnosis not present

## 2015-09-09 DIAGNOSIS — E46 Unspecified protein-calorie malnutrition: Secondary | ICD-10-CM | POA: Diagnosis present

## 2015-09-09 DIAGNOSIS — I110 Hypertensive heart disease with heart failure: Secondary | ICD-10-CM | POA: Diagnosis present

## 2015-09-09 DIAGNOSIS — Z720 Tobacco use: Secondary | ICD-10-CM | POA: Diagnosis not present

## 2015-09-09 DIAGNOSIS — D500 Iron deficiency anemia secondary to blood loss (chronic): Secondary | ICD-10-CM | POA: Diagnosis present

## 2015-09-09 DIAGNOSIS — C185 Malignant neoplasm of splenic flexure: Secondary | ICD-10-CM

## 2015-09-09 DIAGNOSIS — K56609 Unspecified intestinal obstruction, unspecified as to partial versus complete obstruction: Secondary | ICD-10-CM | POA: Diagnosis present

## 2015-09-09 DIAGNOSIS — K6389 Other specified diseases of intestine: Secondary | ICD-10-CM

## 2015-09-09 DIAGNOSIS — Z8489 Family history of other specified conditions: Secondary | ICD-10-CM | POA: Diagnosis not present

## 2015-09-09 DIAGNOSIS — I5022 Chronic systolic (congestive) heart failure: Secondary | ICD-10-CM | POA: Diagnosis present

## 2015-09-09 DIAGNOSIS — K631 Perforation of intestine (nontraumatic): Secondary | ICD-10-CM

## 2015-09-09 DIAGNOSIS — E860 Dehydration: Secondary | ICD-10-CM | POA: Diagnosis present

## 2015-09-09 DIAGNOSIS — N170 Acute kidney failure with tubular necrosis: Secondary | ICD-10-CM | POA: Diagnosis present

## 2015-09-09 DIAGNOSIS — C787 Secondary malignant neoplasm of liver and intrahepatic bile duct: Secondary | ICD-10-CM | POA: Diagnosis present

## 2015-09-09 DIAGNOSIS — R140 Abdominal distension (gaseous): Secondary | ICD-10-CM

## 2015-09-09 DIAGNOSIS — Z8572 Personal history of non-Hodgkin lymphomas: Secondary | ICD-10-CM | POA: Diagnosis not present

## 2015-09-09 DIAGNOSIS — Z9889 Other specified postprocedural states: Secondary | ICD-10-CM

## 2015-09-09 DIAGNOSIS — C8290 Follicular lymphoma, unspecified, unspecified site: Secondary | ICD-10-CM | POA: Diagnosis present

## 2015-09-09 DIAGNOSIS — C801 Malignant (primary) neoplasm, unspecified: Secondary | ICD-10-CM

## 2015-09-09 DIAGNOSIS — R109 Unspecified abdominal pain: Secondary | ICD-10-CM

## 2015-09-09 DIAGNOSIS — C189 Malignant neoplasm of colon, unspecified: Principal | ICD-10-CM | POA: Diagnosis present

## 2015-09-09 DIAGNOSIS — M7989 Other specified soft tissue disorders: Secondary | ICD-10-CM | POA: Diagnosis not present

## 2015-09-09 DIAGNOSIS — I255 Ischemic cardiomyopathy: Secondary | ICD-10-CM | POA: Diagnosis present

## 2015-09-09 LAB — URINALYSIS COMPLETE WITH MICROSCOPIC (ARMC ONLY)
Bacteria, UA: NONE SEEN
Bilirubin Urine: NEGATIVE
Glucose, UA: NEGATIVE mg/dL
Hgb urine dipstick: NEGATIVE
KETONES UR: NEGATIVE mg/dL
Nitrite: NEGATIVE
PROTEIN: NEGATIVE mg/dL
SPECIFIC GRAVITY, URINE: 1.004 — AB (ref 1.005–1.030)
Squamous Epithelial / LPF: NONE SEEN
pH: 6 (ref 5.0–8.0)

## 2015-09-09 LAB — CBC WITH DIFFERENTIAL/PLATELET
BASOS PCT: 0 %
Basophils Absolute: 0.1 10*3/uL (ref 0–0.1)
EOS ABS: 0.6 10*3/uL (ref 0–0.7)
Eosinophils Relative: 3 %
HEMATOCRIT: 31.1 % — AB (ref 40.0–52.0)
HEMOGLOBIN: 9.9 g/dL — AB (ref 13.0–18.0)
LYMPHS ABS: 1.3 10*3/uL (ref 1.0–3.6)
Lymphocytes Relative: 7 %
MCH: 25.6 pg — ABNORMAL LOW (ref 26.0–34.0)
MCHC: 32 g/dL (ref 32.0–36.0)
MCV: 80 fL (ref 80.0–100.0)
MONOS PCT: 7 %
Monocytes Absolute: 1.2 10*3/uL — ABNORMAL HIGH (ref 0.2–1.0)
NEUTROS ABS: 16 10*3/uL — AB (ref 1.4–6.5)
NEUTROS PCT: 83 %
Platelets: 203 10*3/uL (ref 150–440)
RBC: 3.88 MIL/uL — AB (ref 4.40–5.90)
RDW: 23.2 % — ABNORMAL HIGH (ref 11.5–14.5)
WBC: 19.3 10*3/uL — AB (ref 3.8–10.6)

## 2015-09-09 LAB — COMPREHENSIVE METABOLIC PANEL
ALBUMIN: 2.3 g/dL — AB (ref 3.5–5.0)
ALK PHOS: 187 U/L — AB (ref 38–126)
ALT: 7 U/L — AB (ref 17–63)
ANION GAP: 11 (ref 5–15)
AST: 15 U/L (ref 15–41)
BILIRUBIN TOTAL: 0.9 mg/dL (ref 0.3–1.2)
BUN: 32 mg/dL — AB (ref 6–20)
CALCIUM: 7 mg/dL — AB (ref 8.9–10.3)
CO2: 19 mmol/L — AB (ref 22–32)
CREATININE: 4.88 mg/dL — AB (ref 0.61–1.24)
Chloride: 100 mmol/L — ABNORMAL LOW (ref 101–111)
GFR calc Af Amer: 13 mL/min — ABNORMAL LOW (ref >60)
GFR calc non Af Amer: 11 mL/min — ABNORMAL LOW (ref >60)
GLUCOSE: 159 mg/dL — AB (ref 65–99)
Potassium: 3.8 mmol/L (ref 3.5–5.1)
SODIUM: 130 mmol/L — AB (ref 135–145)
Total Protein: 4.9 g/dL — ABNORMAL LOW (ref 6.5–8.1)

## 2015-09-09 MED ORDER — IOHEXOL 240 MG/ML SOLN
25.0000 mL | INTRAMUSCULAR | Status: AC
  Administered 2015-09-09 (×2): 25 mL via ORAL

## 2015-09-09 MED ORDER — SODIUM CHLORIDE 0.9 % IV SOLN
INTRAVENOUS | Status: DC
  Administered 2015-09-09 – 2015-09-10 (×2): via INTRAVENOUS
  Filled 2015-09-09 (×6): qty 1000

## 2015-09-09 MED ORDER — SODIUM CHLORIDE 0.9 % IV SOLN
INTRAVENOUS | Status: DC
  Administered 2015-09-09: 16:00:00 via INTRAVENOUS

## 2015-09-09 MED ORDER — PANTOPRAZOLE SODIUM 40 MG IV SOLR: 40.0000 mg | Freq: Every day | INTRAVENOUS | Status: DC

## 2015-09-09 MED ORDER — SODIUM CHLORIDE 0.9 % IV SOLN: INTRAVENOUS | Status: DC | PRN

## 2015-09-09 MED ORDER — SODIUM CHLORIDE 0.9 % IV BOLUS (SEPSIS)
250.0000 mL | Freq: Once | INTRAVENOUS | Status: AC
  Administered 2015-09-09: 250 mL via INTRAVENOUS

## 2015-09-09 MED ORDER — MORPHINE SULFATE (PF) 2 MG/ML IV SOLN
2.0000 mg | INTRAVENOUS | Status: DC | PRN
  Administered 2015-09-09 – 2015-09-10 (×2): 2 mg via INTRAVENOUS
  Filled 2015-09-09 (×3): qty 1

## 2015-09-09 MED ORDER — PIPERACILLIN-TAZOBACTAM 3.375 G IVPB
3.3750 g | Freq: Two times a day (BID) | INTRAVENOUS | Status: DC
  Administered 2015-09-09 – 2015-09-15 (×13): 3.375 g via INTRAVENOUS
  Filled 2015-09-09 (×17): qty 50

## 2015-09-09 NOTE — Plan of Care (Signed)
Problem: Discharge Progression Outcomes Goal: Other Discharge Outcomes/Goals Outcome: Progressing Pt alert and oriented, possible discharge in 2-4 days depending on CT scan results and results

## 2015-09-09 NOTE — Plan of Care (Signed)
Problem: Discharge Progression Outcomes Goal: Discharge plan in place and appropriate Outcome: Progressing Craig Page was discharged last week and was admitted today from the cancer center. Pt lives at home with his wife and has had increased LUQ abd pain and became severely dizzy. History of Lymphoma and being treated in the cancer center. Creatine and BUN elevate, fluids running at 218ml/hr monitor labs every day and increase po fluid intake. Pt has no history of renal failure.  urine output at least 30 ml/hour  serum creatinine, and creatinine clearance within normal range Early ambulation to prevent further weakness CT and ultrasound done of abd 09/09/15 Poor po intake, may need a dietary consult Monitor I/O

## 2015-09-09 NOTE — H&P (Signed)
Craig Gleason, MD Physician Signed Oncology Progress Notes 09/03/2015 7:42 PM  Related encounter: Admission (Discharged) from 08/30/2015 in [place] @ [facility] [phone] [phone] Woodlyn Caltagirone OB: [birth year] MR#: [id number] MEQ#:[id number]  Patient Care Team: Arlis Porta., MD as PCP - General (Family Medicine)  CHIEF COMPLAINT:  No chief complaint on file.   1. Patient diagnosed with malignant lymphoma non-Hodgkin's follicular type, grade 1 finished showing t 14:18.CD20 positive. Stage IIIa. 2. Finished a maintenance Rituxan in October of 2012. 3. PET scan in March 2015 shows complete remission. PET scan was done in November 2016 in [place] prior to open heart surgery and was reported to be negative.   Oncology Flowsheet 10/17/2014 10/18/2014 10/19/2014  diazepam (VALIUM) PO - - 5 mg  enoxaparin (LOVENOX) [place] 40 mg 40 mg -    INTERVAL HISTORY:  69 year old gentleman came today further follow-up feeling extremely weak tired.  Abdominal distention.  Increasing abdominal pain. Persistent leukocytosis.  Feeling week tired has swelling of lower extremity In view of declining performance status increasing abdominal pain not able to take any antibiotics by mouth patient was admitted in the hospital for further evaluation and treatment consideration  REVIEW OF SYSTEMS:   general status: Patient is feeling weak and tired.  No change in a performance status.  No chills.  No fever. HEENT: No evidence of stomatitis Lungs: No cough or shortness of breath Cardiac: No chest pain or paroxysmal nocturnal dyspnea GI: Increasing abdominal discomfort.  Abdominal distention.  Poor appetite.  Losing weight.  Persistent nausea.  Not able to take any medication by mouth Skin: No rash Lower extremity no swelling Neurological system: No tingling.  No  numbness.  No other focal signs Musculoskeletal system no bony pains  PAST MEDICAL HISTORY: Past Medical History  Diagnosis Date  . CAD (coronary artery disease)     3v  . Ischemic cardiomyopathy   . Chronic systolic heart failure   . HTN (hypertension)   . HLD (hyperlipidemia)   . Follicular lymphoma   . Myocardial infarction 1992    treated with thrombolytics/notes 10/16/2014    PAST SURGICAL HISTORY: Past Surgical History  Procedure Laterality Date  . Hernia repair    . Umbilical hernia repair  1964  . Coronary artery bypass graft N/A 10/19/2014    Procedure: CORONARY ARTERY BYPASS GRAFTING (CABG); Surgeon: Ivin Poot, MD; Location: [place]; Service: Open Heart Surgery; Laterality: N/A; Times 3 using left internal mammary artery and endoscopically harvested left saphenous vein  . Intraoperative transesophageal echocardiogram N/A 10/19/2014    Procedure: INTRAOPERATIVE TRANSESOPHAGEAL ECHOCARDIOGRAM; Surgeon: Ivin Poot, MD; Location: [place]; Service: Open Heart Surgery; Laterality: N/A;    FAMILY HISTORY Family History  Problem Relation Age of Onset  . Heart failure Mother     deceased  . Cirrhosis Father     deceased   Significant History/PMH:  Hypertension:   Hyperlipidemia:   Acute Myocardial Infarction:   CAD:   chf:   lymphoma:   Tobacco Use:   CABG: Nov 2015  PFSH: Comments: daughter also has lymphoma  Comments: positive for tobacco use, 30 years history of smokingdoes not drink  Additional Past Medical and Surgical History: hypocholesteremia  Hypertension  History of coronary artery disease  Gastroesophageal reflux disease    ADVANCED DIRECTIVES:  No flowsheet data found.  HEALTH MAINTENANCE: Social History  Substance Use Topics  . Smoking status: Former Smoker -- 1.00 packs/day  for 50 years    Types: Cigarettes    Quit date: 09/24/2014  . Smokeless tobacco: Never Used  . Alcohol Use: No     No Known Allergies  Current Facility-Administered Medications  Medication Dose Route Frequency Provider Last Rate Last Dose  . alum & mag hydroxide-simeth (MAALOX/MYLANTA) 200-200-20 MG/5ML suspension 15 mL 15 mL Oral Q4H PRN Craig Gleason, MD    . ciprofloxacin (CIPRO) tablet 750 mg 750 mg Oral BID Craig Gleason, MD  750 mg at 09/03/15 1610  . dextrose 5 % and 0.45 % NaCl with KCl 30 mEq/L infusion  Intravenous Continuous Craig Gleason, MD 10 mL/hr at 09/02/15 2055   . guaiFENesin-dextromethorphan (ROBITUSSIN DM) 100-10 MG/5ML syrup 10 mL 10 mL Oral Q4H PRN Cammie Sickle, MD    . hydrocortisone (ANUSOL-HC) 2.5 % rectal cream 1 application 1 application Rectal BID PRN Cammie Sickle, MD    . metroNIDAZOLE (FLAGYL) tablet 500 mg 500 mg Oral 3 times per day Craig Gleason, MD  500 mg at 09/03/15 1508  . ondansetron (ZOFRAN) tablet 4-8 mg 4-8 mg Oral Q8H PRN Cammie Sickle, MD     Or  . ondansetron (ZOFRAN-ODT) disintegrating tablet 4-8 mg 4-8 mg Oral Q8H PRN Cammie Sickle, MD     Or  . ondansetron (ZOFRAN) injection 4 mg 4 mg Intravenous Q8H PRN Cammie Sickle, MD     Or  . ondansetron (ZOFRAN) 8 mg in sodium chloride 0.9 % 50 mL IVPB 8 mg Intravenous Q8H PRN Cammie Sickle, MD    . pantoprazole (PROTONIX) EC tablet 40 mg 40 mg Oral Daily Craig Gleason, MD  40 mg at 09/03/15 9604  . potassium chloride SA (K-DUR,KLOR-CON) CR tablet 20 mEq 20 mEq Oral BID Christene Lye, MD  20 mEq at 09/03/15 5409   Facility-Administered Medications Ordered in Other Encounters  Medication Dose Route Frequency Provider Last Rate Last Dose  . fludeoxyglucose F - 18 (FDG)  injection 11.21 milli Curie 11.21 milli Curie Intravenous Once PRN Craig Gleason, MD  11.21 milli Curie at 08/29/15 1233    OBJECTIVE:  Filed Vitals:   09/03/15 1233  BP: 111/52  Pulse: 61  Temp: 98.4 F (36.9 C)  Resp: 16   Body mass index is 22.88 kg/(m^2). ECOG FS:1 - Symptomatic but completely ambulatory  PHYSICAL EXAM: Gen. status: Patient is feeling week tired. Head exam was generally normal. There was no scleral icterus or corneal arcus. Mucous membranes were moist. Examination of the chest was unremarkable. There were no bony deformities, no asymmetry, and no other abnormalities. Cardiac: Tachycardia blood pressure is slightly low soft systolic murmur Abdomen: Mild distention.Bowel sounds are diminished.  Tenderness in abdominal area.  Lymphatic system: Small palpable lymph node in the left inguinal area. No other palpable lymphadenopathy Lower extremity trace edema Neurologically, the patient was awake, alert, and oriented to person, place and time. There were no obvious focal neurologic abnormalities. Examination of the skin revealed no evidence of significant rashes, suspicious appearing nevi or other concerning lesions.    LAB RESULTS:                                                                                                                                                                                                                                                                                    .       .       .       .       .       .       .       .       .       .       .       .       .       .       .       .       .          .       .       .       .       .       .       .       .       .       .       .       .       .       .       .       .       .       .       .       .       .       .       .       .        .       .       .       .       .       .       .       .       .       .       .       .       .       .       .       .       .          .       .       .       .       .       .       .       .          .       .       .       .       .       .       .       .       .       .       .       .       .       .       .       .       .       .       .       .       .       .       .       .       .       .       .       .       .       .       .       .       .       .       .       .       .       .       .       .       .       .       .       Marland Kitchen       Marland Kitchen  All lab data has been reviewed     ASSESSMENT: Perforated colon cancer which is contained patient is responding to IV antibiotics.  Discussed situation with the arm Dr. Gevena Mart for possible surgery next week. If tolerated diet very well then patient will be discharged tomorrow morning Patient is making progress with ambulation Cardiologist note has been reviewed patient will get an echocardiogram done on September 30. Leukocytosis persist in view of continuing leukocytosis increasing abdominal distention progressing pain patient was admitted in the hospital for IV antibiotics I discussed situation with Dr. Jamal Collin  a repeat CT scan has been scheduled Regarding pain management will start patient on a small dose of fentanyl patch and IV morphine for pain control Patient has developed acute renal failure most likely due to dehydration Will get nephrological evaluation increase IV fluid Craig Gleason, MD 09/03/2015 7:43 PM

## 2015-09-09 NOTE — Progress Notes (Signed)
ANTIBIOTIC CONSULT NOTE - INITIAL  Pharmacy Consult for zosyn Indication: intra-abdominal infection  No Known Allergies  Patient Measurements:     Vital Signs: BP: 102/66 mmHg (10/03 1216) Pulse Rate: 56 (10/03 1216)   Labs:  Recent Labs  09/09/15 1125  WBC 19.3*  HGB 9.9*  PLT 203  CREATININE 4.88*   Estimated Creatinine Clearance: 15.2 mL/min (by C-G formula based on Cr of 4.88). No results for input(s): VANCOTROUGH, VANCOPEAK, VANCORANDOM, GENTTROUGH, GENTPEAK, GENTRANDOM, TOBRATROUGH, TOBRAPEAK, TOBRARND, AMIKACINPEAK, AMIKACINTROU, AMIKACIN in the last 72 hours.   Microbiology: No results found for this or any previous visit (from the past 720 hour(s)).  Assessment: Pharmacy consulted to dose zosyn for intra-abdominal infection in this 69 year old male. No H&P available at this time. Appears to have AKI with SCr of 4.88 (up from ~1.0 in September), estCrCl: 15.75ml/min  Plan:  Will order zosyn 3.375gm IV Q12H as indicated for CrCl < 30ml/min.  Pharmacy to follow per consult.   Rexene Edison, PharmD Clinical Pharmacist 09/09/2015 2:00 PM

## 2015-09-09 NOTE — Progress Notes (Signed)
Dr. Jamal Collin called RN regarding unable to enter new orders. Telephone orders read back & verified for: - NS w/ 30K @ 184mL (K 3.8, BUN 32, Creatinine 4.88) - NS bolus 253mL - NPO after midnight for possible procedure tomorrow depending on labs - Foley catheter inserted for critical I/O Will continue to closely monitor patient throughout the night.

## 2015-09-09 NOTE — Consult Note (Signed)
Reason for Consult:abdominal pain, suspected colon CA with focal perforation Referring Physician:Janak Choksi,md   Craig Page is an 69 y.o. male.  HPI: Pt was noted to have findings of contained perforation of splenic flexure about 10 days ago. Has been treated with antibiotics and seemed to  respond with resolution of diarrhea and marked decrease in luq abd pain.  Plan was for an interval colon resewction. Last 2-3 days he has developed abd distension and some increase in pain though not localised in one place. He also seems a bit confused   Past Medical History  Diagnosis Date  . CAD (coronary artery disease)     3v  . Ischemic cardiomyopathy   . Chronic systolic heart failure ([place])   . HTN (hypertension)   . HLD (hyperlipidemia)   . Follicular lymphoma ([place])   . Myocardial infarction [facility]) 1992    treated with thrombolytics/notes 10/16/2014    Past Surgical History  Procedure Laterality Date  . Hernia repair    . Umbilical hernia repair  1964  . Coronary artery bypass graft N/A 10/19/2014    Procedure: CORONARY ARTERY BYPASS GRAFTING (CABG);  Surgeon: Ivin Poot, MD;  Location: [place];  Service: Open Heart Surgery;  Laterality: N/A;  Times 3 using left internal mammary artery and endoscopically harvested left saphenous vein  . Intraoperative transesophageal echocardiogram N/A 10/19/2014    Procedure: INTRAOPERATIVE TRANSESOPHAGEAL ECHOCARDIOGRAM;  Surgeon: Ivin Poot, MD;  Location: [place];  Service: Open Heart Surgery;  Laterality: N/A;    Family History  Problem Relation Age of Onset  . Heart failure Mother     deceased  . Cirrhosis Father     deceased    Social History:  reports that he quit smoking about a year ago. His smoking use included Cigarettes. He has a 50 pack-year smoking history. He has never used smokeless tobacco. He reports that he does not drink alcohol or use illicit drugs.  Allergies: No Known Allergies  Medications: I have reviewed the  patient's current medications.  Results for orders placed or performed during the hospital encounter of 09/09/15 (from the past 48 hour(s))  Urinalysis complete, with microscopic ([facility] only)     Status: Abnormal   Collection Time: 09/09/15  6:34 PM  Result Value Ref Range   Color, Urine YELLOW (A) YELLOW   APPearance CLEAR (A) CLEAR   Glucose, UA NEGATIVE NEGATIVE mg/dL   Bilirubin Urine NEGATIVE NEGATIVE   Ketones, ur NEGATIVE NEGATIVE mg/dL   Specific Gravity, Urine 1.004 (L) 1.005 - 1.030   Hgb urine dipstick NEGATIVE NEGATIVE   pH 6.0 5.0 - 8.0   Protein, ur NEGATIVE NEGATIVE mg/dL   Nitrite NEGATIVE NEGATIVE   Leukocytes, UA TRACE (A) NEGATIVE   RBC / HPF 0-5 0 - 5 RBC/hpf   WBC, UA 0-5 0 - 5 WBC/hpf   Bacteria, UA NONE SEEN NONE SEEN   Squamous Epithelial / LPF NONE SEEN NONE SEEN    Ct Abdomen Pelvis Wo Contrast  09/09/2015   CLINICAL DATA:  Initial encounter for increasing left upper quadrant pain in a patient with history of lymphoma. 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  EXAM: CT ABDOMEN AND PELVIS WITHOUT CONTRAST  TECHNIQUE: Multidetector CT imaging of the abdomen and pelvis was performed following the standard protocol without IV contrast.  COMPARISON:  08/29/2015.  FINDINGS: Lower chest: Emphysema with chronic interstitial changes noted in the lung bases. A posterior focus of collapse/consolidation  is seen in the left lower lobe. 2.1 cm nodular pleural-based lesion is seen at the dome of the left hemidiaphragm.  Hepatobiliary: Nodularity along the anterior liver margin raises the question of cirrhosis. There is a new 2.2 cm low-density lesion in the lateral segment left liver (image 13 series 2). A new 2.4 cm lesion is seen in the inferior right liver (image 30). These are suspicious for metastatic disease given the rapid interval appearance. There is no evidence for gallstones, gallbladder wall thickening, or pericholecystic  fluid. No intrahepatic or extrahepatic biliary dilation.  Pancreas: No focal mass lesion. No dilatation of the main duct. No intraparenchymal cyst. No peripancreatic edema.  Spleen: No splenomegaly. No focal mass lesion.  Adrenals/Urinary Tract: Right adrenal gland is unremarkable. 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 Kidneys are unremarkable. No evidence for hydroureter. The urinary bladder appears normal for the degree of distention.  Stomach/Bowel: Tiny hiatal hernia noted. Stomach otherwise unremarkable. No small bowel wall thickening. No small bowel dilatation. The terminal ileum is normal. The appendix is normal. Large lesion involving the splenic flexure of the colon again noted. No evidence for obstruction.  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 Small nodules are seen along the margin of the large splenic flexure lesion with a conglomeration of lymph nodes identified in the central small bowel mesentery measuring 2.9 x 3.4 cm.  Reproductive: Prostate gland is mildly enlarged.  Other: Small volume intraperitoneal free fluid is evident.  Musculoskeletal: Bone windows reveal no worrisome lytic or sclerotic osseous lesions.  IMPRESSION: 1. No substantial change in the large necrotic/perforated splenic flexure mass with adjacent nodularity and central mesenteric lymphadenopathy. 2. Interval development of hypo attenuating lesions in the liver parenchyma, suspicious for metastatic disease. 3. Interval development of small volume intraperitoneal ascites 4. 3.9 cm abdominal aortic aneurysm   Electronically Signed   By: Misty Stanley M.D.   On: 09/09/2015 16:39    Review of Systems  Constitutional: Positive for malaise/fatigue. Negative for fever and chills.  Respiratory: Negative.   Cardiovascular: Negative for chest pain and palpitations.  Gastrointestinal: Positive for abdominal pain. Negative for heartburn, nausea, vomiting, diarrhea and constipation.    Blood pressure 101/65, pulse 98, temperature 97.5 F (36.4 C), temperature source Oral, height 5\' 11"  (1.803 m), weight 165 lb 1.6 oz (74.889 kg), SpO2 99 %. Physical Exam  Constitutional: He appears well-developed.  Eyes: Conjunctivae are normal. Pupils are equal, round, and reactive to light.  Neck: Neck supple.  Cardiovascular: Normal rate, regular rhythm and normal heart sounds.   Respiratory: Effort normal and breath sounds normal.  GI: He exhibits distension and mass (vague fullness luq). There is tenderness (mild in luq). There is no rebound and no guarding.  Neurological: He is alert.  Appears a bit confused-not able to recall details of his visit to ER couple of days ago.    Assessment/Plan: Suspected splenic flexure colon malignancy with focal contained perforation and likely metastatic disease. 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  Given failure of antibiotic regimen, he likely will need surgical exploration sooner. Currently he has moderate elevation of creatinine, likely from dehydration.  He is currently getting rehydrated. Will reevaluate in am. If his labs are better will proceed with laparotomy/colon resection.  Selena Swaminathan G 09/09/2015, 7:54 PM

## 2015-09-09 NOTE — H&P (Signed)
[place] [facility] called for access, unable to get access on pt. 1-2+ pitting edema, would only let me attempt to access x1 try. Supervisor notified.

## 2015-09-09 NOTE — Plan of Care (Signed)
Problem: Phase III Progression Outcomes Goal: IV/normal saline lock discontinued Outcome: Progressing Fluids going at 250/ml hr. Increasing fluids.

## 2015-09-09 NOTE — Progress Notes (Signed)
Patient being admitted to room #124. Called report to [place], Therapist, sports.  Called orderly, [place] for transport via wheelchair.

## 2015-09-09 NOTE — Progress Notes (Signed)
So was admitted in the hospital to see detail history and physical from the hospital note

## 2015-09-10 ENCOUNTER — Encounter: Payer: Self-pay | Admitting: Anesthesiology

## 2015-09-10 ENCOUNTER — Inpatient Hospital Stay: Payer: Commercial Managed Care - HMO | Admitting: Certified Registered"

## 2015-09-10 ENCOUNTER — Encounter
Admission: AD | Disposition: A | Discharge status: Skilled Nursing Facility | Payer: Self-pay | Source: Ambulatory Visit | Attending: Oncology

## 2015-09-10 DIAGNOSIS — M7989 Other specified soft tissue disorders: Secondary | ICD-10-CM

## 2015-09-10 DIAGNOSIS — D72829 Elevated white blood cell count, unspecified: Secondary | ICD-10-CM

## 2015-09-10 DIAGNOSIS — R188 Other ascites: Secondary | ICD-10-CM

## 2015-09-10 DIAGNOSIS — K631 Perforation of intestine (nontraumatic): Secondary | ICD-10-CM

## 2015-09-10 DIAGNOSIS — R944 Abnormal results of kidney function studies: Secondary | ICD-10-CM

## 2015-09-10 DIAGNOSIS — C786 Secondary malignant neoplasm of retroperitoneum and peritoneum: Secondary | ICD-10-CM | POA: Diagnosis present

## 2015-09-10 DIAGNOSIS — N179 Acute kidney failure, unspecified: Secondary | ICD-10-CM

## 2015-09-10 DIAGNOSIS — C801 Malignant (primary) neoplasm, unspecified: Secondary | ICD-10-CM

## 2015-09-10 HISTORY — PX: LAPAROSCOPY: SHX197

## 2015-09-10 HISTORY — PX: LAPAROTOMY: SHX154

## 2015-09-10 HISTORY — PX: TRANSVERSE LOOP COLOSTOMY: SHX6478

## 2015-09-10 LAB — COMPREHENSIVE METABOLIC PANEL
ALT: 7 U/L — ABNORMAL LOW (ref 17–63)
ANION GAP: 5 (ref 5–15)
AST: 12 U/L — ABNORMAL LOW (ref 15–41)
Albumin: 2.1 g/dL — ABNORMAL LOW (ref 3.5–5.0)
Alkaline Phosphatase: 191 U/L — ABNORMAL HIGH (ref 38–126)
BUN: 32 mg/dL — ABNORMAL HIGH (ref 6–20)
CHLORIDE: 110 mmol/L (ref 101–111)
CO2: 19 mmol/L — ABNORMAL LOW (ref 22–32)
CREATININE: 4.91 mg/dL — AB (ref 0.61–1.24)
Calcium: 7 mg/dL — ABNORMAL LOW (ref 8.9–10.3)
GFR, EST AFRICAN AMERICAN: 13 mL/min — AB (ref >60)
GFR, EST NON AFRICAN AMERICAN: 11 mL/min — AB (ref >60)
Glucose, Bld: 105 mg/dL — ABNORMAL HIGH (ref 65–99)
POTASSIUM: 4.5 mmol/L (ref 3.5–5.1)
Sodium: 134 mmol/L — ABNORMAL LOW (ref 135–145)
Total Bilirubin: 0.9 mg/dL (ref 0.3–1.2)
Total Protein: 4.1 g/dL — ABNORMAL LOW (ref 6.5–8.1)

## 2015-09-10 LAB — CBC
HCT: 28.5 % — ABNORMAL LOW (ref 40.0–52.0)
Hemoglobin: 9.1 g/dL — ABNORMAL LOW (ref 13.0–18.0)
MCH: 25.4 pg — AB (ref 26.0–34.0)
MCHC: 32.1 g/dL (ref 32.0–36.0)
MCV: 79.3 fL — AB (ref 80.0–100.0)
PLATELETS: 200 10*3/uL (ref 150–440)
RBC: 3.59 MIL/uL — ABNORMAL LOW (ref 4.40–5.90)
RDW: 22.8 % — AB (ref 11.5–14.5)
WBC: 21.9 10*3/uL — ABNORMAL HIGH (ref 3.8–10.6)

## 2015-09-10 LAB — MAGNESIUM: MAGNESIUM: 1.6 mg/dL — AB (ref 1.7–2.4)

## 2015-09-10 SURGERY — LAPAROSCOPY, DIAGNOSTIC
Anesthesia: General | Site: Abdomen | Laterality: Right

## 2015-09-10 MED ORDER — PIPERACILLIN-TAZOBACTAM 3.375 G IVPB: 3.3750 g | Freq: Two times a day (BID) | INTRAVENOUS | Status: DC

## 2015-09-10 MED ORDER — ACETAMINOPHEN 325 MG PO TABS: 650.0000 mg | ORAL_TABLET | Freq: Four times a day (QID) | ORAL | Status: DC | PRN

## 2015-09-10 MED ORDER — OXYCODONE HCL 5 MG PO TABS: 5.0000 mg | ORAL_TABLET | Freq: Once | ORAL | Status: DC | PRN

## 2015-09-10 MED ORDER — ONDANSETRON HCL 4 MG PO TABS
4.0000 mg | ORAL_TABLET | Freq: Three times a day (TID) | ORAL | Status: DC | PRN
  Filled 2015-09-10: qty 1

## 2015-09-10 MED ORDER — MORPHINE SULFATE (PF) 2 MG/ML IV SOLN
2.0000 mg | INTRAVENOUS | Status: DC | PRN
  Administered 2015-09-11 – 2015-09-23 (×19): 2 mg via INTRAVENOUS
  Filled 2015-09-10 (×19): qty 1

## 2015-09-10 MED ORDER — ACETAMINOPHEN 10 MG/ML IV SOLN
INTRAVENOUS | Status: DC | PRN
  Administered 2015-09-10: 1000 mg via INTRAVENOUS

## 2015-09-10 MED ORDER — PANTOPRAZOLE SODIUM 40 MG PO TBEC
40.0000 mg | DELAYED_RELEASE_TABLET | Freq: Every day | ORAL | Status: DC
  Administered 2015-09-11 – 2015-09-24 (×14): 40 mg via ORAL
  Filled 2015-09-10 (×14): qty 1

## 2015-09-10 MED ORDER — PHENYLEPHRINE HCL 10 MG/ML IJ SOLN
INTRAMUSCULAR | Status: DC | PRN
  Administered 2015-09-10 (×10): 100 ug via INTRAVENOUS
  Administered 2015-09-10: 200 ug via INTRAVENOUS
  Administered 2015-09-10: 100 ug via INTRAVENOUS

## 2015-09-10 MED ORDER — OXYCODONE HCL 5 MG/5ML PO SOLN: 5.0000 mg | Freq: Once | ORAL | Status: DC | PRN

## 2015-09-10 MED ORDER — FENTANYL CITRATE (PF) 100 MCG/2ML IJ SOLN
INTRAMUSCULAR | Status: DC | PRN
  Administered 2015-09-10 (×3): 50 ug via INTRAVENOUS
  Administered 2015-09-10: 100 ug via INTRAVENOUS

## 2015-09-10 MED ORDER — SUGAMMADEX SODIUM 200 MG/2ML IV SOLN
INTRAVENOUS | Status: DC | PRN
  Administered 2015-09-10: 149.8 mg via INTRAVENOUS

## 2015-09-10 MED ORDER — PROPOFOL 10 MG/ML IV BOLUS
INTRAVENOUS | Status: DC | PRN
  Administered 2015-09-10: 150 mg via INTRAVENOUS

## 2015-09-10 MED ORDER — ONDANSETRON HCL 4 MG/2ML IJ SOLN
INTRAMUSCULAR | Status: DC | PRN
  Administered 2015-09-10: 4 mg via INTRAVENOUS

## 2015-09-10 MED ORDER — ESMOLOL HCL 10 MG/ML IV SOLN
INTRAVENOUS | Status: DC | PRN
  Administered 2015-09-10: 20 mg via INTRAVENOUS

## 2015-09-10 MED ORDER — LIDOCAINE HCL (CARDIAC) 20 MG/ML IV SOLN
INTRAVENOUS | Status: DC | PRN
  Administered 2015-09-10: 50 mg via INTRAVENOUS

## 2015-09-10 MED ORDER — AMIODARONE HCL 200 MG PO TABS
200.0000 mg | ORAL_TABLET | Freq: Every day | ORAL | Status: DC
  Administered 2015-09-12 – 2015-09-24 (×13): 200 mg via ORAL
  Filled 2015-09-10 (×13): qty 1

## 2015-09-10 MED ORDER — SPIRONOLACTONE 25 MG PO TABS: 12.5000 mg | ORAL_TABLET | Freq: Every day | ORAL | Status: DC

## 2015-09-10 MED ORDER — ACETAMINOPHEN 650 MG RE SUPP: 650.0000 mg | Freq: Four times a day (QID) | RECTAL | Status: DC | PRN

## 2015-09-10 MED ORDER — LACTATED RINGERS IV SOLN
INTRAVENOUS | Status: DC | PRN
  Administered 2015-09-10: 16:00:00 via INTRAVENOUS

## 2015-09-10 MED ORDER — CARVEDILOL 3.125 MG PO TABS: 3.1250 mg | ORAL_TABLET | Freq: Two times a day (BID) | ORAL | Status: DC

## 2015-09-10 MED ORDER — SODIUM CHLORIDE 0.9 % IV SOLN
INTRAVENOUS | Status: DC
  Administered 2015-09-10 – 2015-09-12 (×5): via INTRAVENOUS

## 2015-09-10 MED ORDER — SUCCINYLCHOLINE CHLORIDE 20 MG/ML IJ SOLN
INTRAMUSCULAR | Status: DC | PRN
  Administered 2015-09-10: 100 mg via INTRAVENOUS

## 2015-09-10 MED ORDER — OXYCODONE HCL 5 MG PO TABS
5.0000 mg | ORAL_TABLET | ORAL | Status: DC | PRN
  Administered 2015-09-11 (×2): 5 mg via ORAL
  Administered 2015-09-13: 10 mg via ORAL
  Administered 2015-09-18: 22:00:00 5 mg via ORAL
  Administered 2015-09-19 – 2015-09-23 (×6): 10 mg via ORAL
  Filled 2015-09-10: qty 1
  Filled 2015-09-10: qty 2
  Filled 2015-09-10: qty 1
  Filled 2015-09-10: qty 2
  Filled 2015-09-10: qty 1
  Filled 2015-09-10 (×3): qty 2
  Filled 2015-09-10: qty 1
  Filled 2015-09-10 (×2): qty 2

## 2015-09-10 MED ORDER — LISINOPRIL 5 MG PO TABS: 2.5000 mg | ORAL_TABLET | Freq: Two times a day (BID) | ORAL | Status: DC

## 2015-09-10 MED ORDER — FENTANYL CITRATE (PF) 100 MCG/2ML IJ SOLN: 25.0000 ug | INTRAMUSCULAR | Status: DC | PRN

## 2015-09-10 MED ORDER — ROCURONIUM BROMIDE 100 MG/10ML IV SOLN
INTRAVENOUS | Status: DC | PRN
  Administered 2015-09-10: 30 mg via INTRAVENOUS
  Administered 2015-09-10: 20 mg via INTRAVENOUS
  Administered 2015-09-10: 10 mg via INTRAVENOUS

## 2015-09-10 MED ORDER — SIMETHICONE 80 MG PO CHEW
80.0000 mg | CHEWABLE_TABLET | Freq: Three times a day (TID) | ORAL | Status: DC | PRN
  Administered 2015-09-11: 21:00:00 80 mg via ORAL
  Filled 2015-09-10 (×4): qty 1

## 2015-09-10 SURGICAL SUPPLY — 72 items
APPLIER CLIP 9.375 SM OPEN (CLIP) ×4
BLADE SURG 10 STRL SS SAFETY (BLADE) IMPLANT
BLADE SURG 11 STRL SS SAFETY (MISCELLANEOUS) ×4 IMPLANT
BLADE SURG 15 STRL SS SAFETY (BLADE) ×4 IMPLANT
CANISTER SUCT 1200ML W/VALVE (MISCELLANEOUS) ×4 IMPLANT
CANNULA DILATOR  5MM W/SLV (CANNULA) ×1
CANNULA DILATOR 10 W/SLV (CANNULA) ×3 IMPLANT
CANNULA DILATOR 10MM W/SLV (CANNULA) ×1
CANNULA DILATOR 12 W/SLV (CANNULA) ×3 IMPLANT
CANNULA DILATOR 12MM W/SLV (CANNULA) ×1
CANNULA DILATOR 5 W/SLV (CANNULA) ×3 IMPLANT
CATH TRAY 16F METER LATEX (MISCELLANEOUS) ×4 IMPLANT
CHLORAPREP W/TINT 26ML (MISCELLANEOUS) ×4 IMPLANT
CLEANER CAUTERY TIP 5X5 PAD (MISCELLANEOUS) IMPLANT
CLIP APPLIE 9.375 SM OPEN (CLIP) ×2 IMPLANT
COVER PROBE FLX POLY STRL (MISCELLANEOUS) ×4 IMPLANT
DEFOGGER SCOPE WARMER CLEARIFY (MISCELLANEOUS) ×4 IMPLANT
DRAPE INCISE IOBAN 66X45 STRL (DRAPES) ×4 IMPLANT
DRAPE LAPAROTOMY 100X77 ABD (DRAPES) ×4 IMPLANT
DRSG TEGADERM 6X8 (GAUZE/BANDAGES/DRESSINGS) ×4 IMPLANT
GLOVE BIO SURGEON STRL SZ7 (GLOVE) ×16 IMPLANT
GOWN STRL REUS W/ TWL LRG LVL3 (GOWN DISPOSABLE) ×8 IMPLANT
GOWN STRL REUS W/TWL LRG LVL3 (GOWN DISPOSABLE) ×8
HANDLE YANKAUER SUCT BULB TIP (MISCELLANEOUS) IMPLANT
HOLDER FOLEY CATH W/STRAP (MISCELLANEOUS) IMPLANT
IRRIGATION STRYKERFLOW (MISCELLANEOUS) IMPLANT
IRRIGATOR STRYKERFLOW (MISCELLANEOUS)
IV LACTATED RINGERS 1000ML (IV SOLUTION) IMPLANT
KIT RM TURNOVER STRD PROC AR (KITS) ×4 IMPLANT
LABEL OR SOLS (LABEL) ×4 IMPLANT
LIQUID BAND (GAUZE/BANDAGES/DRESSINGS) ×4 IMPLANT
NDL INSUFF ACCESS 14 VERSASTEP (NEEDLE) ×4 IMPLANT
NEEDLE VERESS 14GA 120MM (NEEDLE) ×4 IMPLANT
NS IRRIG 1000ML POUR BTL (IV SOLUTION) ×4 IMPLANT
NS IRRIG 500ML POUR BTL (IV SOLUTION) ×4 IMPLANT
PACK BASIN MAJOR ARMC (MISCELLANEOUS) ×4 IMPLANT
PACK COLON CLEAN CLOSURE (MISCELLANEOUS) ×4 IMPLANT
PACK LAP CHOLECYSTECTOMY (MISCELLANEOUS) ×4 IMPLANT
PAD CLEANER CAUTERY TIP 5X5 (MISCELLANEOUS)
PAD GROUND ADULT SPLIT (MISCELLANEOUS) ×4 IMPLANT
PENCIL ELECTRO HAND CTR (MISCELLANEOUS) IMPLANT
POUCH ENDO CATCH 10MM SPEC (MISCELLANEOUS) IMPLANT
SCISSORS METZENBAUM CVD 33 (INSTRUMENTS) ×4 IMPLANT
SET YANKAUER POOLE SUCT (MISCELLANEOUS) ×4 IMPLANT
SHEARS HARMONIC ACE PLUS 36CM (ENDOMECHANICALS) IMPLANT
SLEEVE ENDOPATH XCEL 5M (ENDOMECHANICALS) ×8 IMPLANT
SPONGE LAP 18X18 5 PK (GAUZE/BANDAGES/DRESSINGS) ×4 IMPLANT
STAPLER PROXIMATE 75MM BLUE (STAPLE) ×4 IMPLANT
STAPLER SKIN PROX 35W (STAPLE) ×4 IMPLANT
SUT MNCRL 3 0 RB1 (SUTURE) ×2 IMPLANT
SUT MONOCRYL 3 0 RB1 (SUTURE) ×2
SUT PDS AB 0 CT1 27 (SUTURE) ×4 IMPLANT
SUT PROLENE 0 CT 1 30 (SUTURE) ×4 IMPLANT
SUT SILK 2 0 (SUTURE) ×2
SUT SILK 2-0 30XBRD TIE 12 (SUTURE) ×2 IMPLANT
SUT SILK 3-0 (SUTURE) ×6 IMPLANT
SUT SILK 3-0 SH-1 18XCR BRD (SUTURE) ×2
SUT VIC AB 0 SH 27 (SUTURE) ×4 IMPLANT
SUT VIC AB 2-0 BRD 54 (SUTURE) ×4 IMPLANT
SUT VIC AB 2-0 CT1 27 (SUTURE) ×4
SUT VIC AB 2-0 CT1 TAPERPNT 27 (SUTURE) ×4 IMPLANT
SUT VIC AB 3-0 54X BRD REEL (SUTURE) ×2 IMPLANT
SUT VIC AB 3-0 BRD 54 (SUTURE) ×2
SUT VIC AB 3-0 SH 27 (SUTURE) ×10
SUT VIC AB 3-0 SH 27X BRD (SUTURE) ×10 IMPLANT
SUT VIC AB 4-0 FS2 27 (SUTURE) ×4 IMPLANT
SUTURE SILK 3-0 SH-1 18XCR BRD (SUTURE) ×2 IMPLANT
SYR BULB IRRIG 60ML STRL (SYRINGE) ×4 IMPLANT
TROCAR XCEL NON-BLD 11X100MML (ENDOMECHANICALS) ×4 IMPLANT
TROCAR XCEL NON-BLD 5MMX100MML (ENDOMECHANICALS) ×8 IMPLANT
TROCAR XCEL UNIV SLVE 11M 100M (ENDOMECHANICALS) ×4 IMPLANT
TUBING INSUFFLATOR HI FLOW (MISCELLANEOUS) IMPLANT

## 2015-09-10 NOTE — Transfer of Care (Signed)
Immediate Anesthesia Transfer of Care Note  Patient: Craig Page  Procedure(s) Performed: Procedure(s): LAPAROSCOPY DIAGNOSTIC (N/A) EXPLORATORY LAPAROTOMY (N/A)  Patient Location: PACU  Anesthesia Type:General  Level of Consciousness: awake  Airway & Oxygen Therapy: Patient Spontanous Breathing and Patient connected to face mask oxygen  Post-op Assessment: Report given to RN  Post vital signs: Reviewed  Last Vitals:  Filed Vitals:   09/10/15 1815  BP: 98/48  Pulse: 88  Temp: 36.1 C  Resp: 18    Complications: No apparent anesthesia complications

## 2015-09-10 NOTE — Op Note (Signed)
Preop diagnosis: Contained perforation of splenic flexure lesion suspected carcinomatosis  Post op diagnosis: Same  Operation: Laparoscopy biopsy of peritoneal nodules and transverse colon colostomy  Surgeon: S.G.Doloras Tellado  Assistant: Chauncey Reading   Anesthesia: Gen.  Complications: None  EBL: 50 mL  Drains: None  Description: Patient was put to sleep in supine position the operating table. Left internal jugular central line was placed for adequate IV axis and the potential for need of immediate chemotherapy. The abdomen was prepped and draped sterile field. Small incision made just above the umbilicus for port placement and a Veress needle position the peritoneal cavity and verified of the hanging drop method and pneumoperitoneum was obtained and 10 mm port was placed. The camera was introduced with the finding of from multiple nodules involving the peritoneum over the diaphragmatic area and the upper abdominal region on both sides and scattered areas throughout the rest of the abdomen. In the left upper quadrant there was a large fragment phlegmon this mass containing what appeared to be a splenic flexure with cold with some fibrinous material in the omentum and the some loops of small bowel the spleen itself was not easily visualized and was unclear as to whether a normal deep actual process involving the spleen. After the further evaluation abdominal cavity was felt that it be safer just do a colostomy and biopsy of peritoneal nodules right lateral 5 mm port was placed and biopsies were obtained of tube peritoneal nodules section suggested the that this was likely a carcinoma and not the lymphoma that he had had in the past. Following this the transverse colon was identified in the right upper quadrant pain and was decided to bring it out at that site a circular one-inch incision was made and this was subsequently extended medially to allow for more adequate mobilization of the transverse colon.  Transverse colon loop was brought up at this field and subsequently with careful exposure the mesenteric border was freed. 75 mm GIA stapler was used to transect the bowel the distal end was left closed and placed inside the peritoneal cavity. The proximal end was subsequently matured as a colostomy the fascial opening of the medial to the colostomy was closed with the few interrupted 0 PDS stitches and the colon was then transfixed to the fascia with 4 sutures of 2-0 Vicryl and the skin opening just medial to the colostomy which was transverse incision was closed with interrupted sutures of 3-0 Monocryl. The laparoscope and placed the colostomy site appeared to be free of any tension or other abnormalities in the closed distal segment showed no abnormality in the was no evidence of any active bleeders. Ports were removed the fascial opening at the umbilical area closed with the 2-0 PDS and the skin incision with the port site closed with Monocryl. The colostomy was then opened and matured with interrupted inverted everting stitches of 3-0 Vicryl. The skin incision poor port incisions were covered with liquid ban and subsequently a colostomy wafer with bag was applied and patient subsequently returned recovery room stable condition.

## 2015-09-10 NOTE — Anesthesia Postprocedure Evaluation (Signed)
  Anesthesia Post-op Note  Patient: Craig Page  Procedure(s) Performed: Procedure(s): LAPAROSCOPY DIAGNOSTIC (N/A) EXPLORATORY LAPAROTOMY (N/A) TRANSVERSE LOOP COLOSTOMY (Right)  Anesthesia type:General ETT  Patient location: PACU  Post pain: Pain level controlled  Post assessment: Post-op Vital signs reviewed, Patient's Cardiovascular Status Stable, Respiratory Function Stable, Patent Airway and No signs of Nausea or vomiting  Post vital signs: Reviewed and stable  Last Vitals:  Filed Vitals:   09/10/15 1845  BP: 100/59  Pulse: 87  Temp:   Resp: 19    Level of consciousness: awake, alert  and patient cooperative  Complications: No apparent anesthesia complications

## 2015-09-10 NOTE — Progress Notes (Signed)
Talked with Dr. Jamal Collin on the phone, he wants pt to be cleared by nephrology and cardiology before pt can have colon resection. Dr. Juleen China notified and he will see pt. Pt has remained NPO since midnight.

## 2015-09-10 NOTE — Care Management (Signed)
Admitted to [facility] with the diagnosis of colon obstruction. Discharged from this facility 09/04/15 with the discharge diagnosis of perforated clon, Mass in the splenic flexor. Lives with wife, [name] [phone]). Sees Dr. Jeb Levering at the [facility]. Sees Dr Luan Pulling, Cardiac surgery in the past. Decreased  appetite for a while. Appointment was arranged to see Dr. Jamal Collin 09/11/15 to discuss surgical interventions.  NPO. Scheduled for Colon Resection today. WBC's 21.9. IV Zosyn continues. Foley intact. Shelbie Ammons RN MSN Care Management [phone]

## 2015-09-10 NOTE — Progress Notes (Signed)
Dr.Vachani will be the prime doctor that rounds on pt for prime doctors. BP low this am, rechecking vital signs frequently. At risk for sepsis related to perforated bowel. Dr. Jamal Collin rounded on pt last night, he recommends a exploratory lap and bowel resection.

## 2015-09-10 NOTE — Plan of Care (Addendum)
Problem: Discharge Progression Outcomes Goal: Other Discharge Outcomes/Goals Plan of care progress to goal: - Continues on IV fluids. - Foley in place. - Pain controlled via PRN pain meds. - Possible surgery (colon resection) today. - Blood pressure 92/60, recheck 88/54, MD notified, continue to monitor, if BP continues to decrease notify MD, per Dr. Grayland Ormond. Will continue to monitor.

## 2015-09-10 NOTE — Progress Notes (Signed)
Craig Page is a 69 y.o. male  [id number]  Primary Cardiologist: Neoma Laming Reason for Consultation: Preoperative clearance for colon surgery  HPI: This is a 69 year old white male with a past medical history of coronary artery disease ischemic cardiomyopathy with ejection fraction 43% on echocardiogram done last week on Friday, presented now with perforation of the colon with history of follicular lymphoma. Patient denies any chest pain shortness of breath PND or orthopnea or leg swelling. Patient has some abdominal pain but appears to be comfortable.   Review of Systems: Review of Systems  Gastrointestinal: Positive for abdominal pain.  Neurological: Positive for dizziness.  All other systems reviewed and are negative.     Past Medical History  Diagnosis Date  . CAD (coronary artery disease)     3v  . Ischemic cardiomyopathy   . Chronic systolic heart failure ([place])   . HTN (hypertension)   . HLD (hyperlipidemia)   . Follicular lymphoma ([place])   . Myocardial infarction [facility]) 1992    treated with thrombolytics/notes 10/16/2014    Medications Prior to Admission  Medication Sig Dispense Refill  . amiodarone (PACERONE) 200 MG tablet Take 1 tablet (200 mg total) by mouth daily. 30 tablet 1  . carvedilol (COREG) 3.125 MG tablet TAKE 1 TABLET BY MOUTH TWICE DAILY. 180 tablet 4  . ciprofloxacin (CIPRO) 500 MG tablet Take 1 tablet (500 mg total) by mouth 2 (two) times daily. 20 tablet 0  . lisinopril (PRINIVIL,ZESTRIL) 2.5 MG tablet TAKE 1 TABLET BY MOUTH TWICE DAILY 180 tablet 0  . metroNIDAZOLE (FLAGYL) 500 MG tablet Take 1 tablet (500 mg total) by mouth every 8 (eight) hours. 30 tablet 0  . ondansetron (ZOFRAN) 4 MG tablet Take 1-2 tablets (4-8 mg total) by mouth every 8 (eight) hours as needed for nausea (not responsive to prochlorperazine (COMPAZINE)). 20 tablet 0  . pantoprazole (PROTONIX) 40 MG tablet Take 40 mg by mouth daily.    . promethazine (PHENERGAN) 25 MG tablet  Take 1 tablet (25 mg total) by mouth every 6 (six) hours as needed for nausea or vomiting. 30 tablet 0  . simethicone (MYLICON) 80 MG chewable tablet Chew 1 tablet by mouth 3 (three) times daily as needed.  1  . spironolactone (ALDACTONE) 25 MG tablet Take 0.5 tablets (12.5 mg total) by mouth daily. 45 tablet 3     . pantoprazole  40 mg Intravenous Daily  . piperacillin-tazobactam (ZOSYN)  IV  3.375 g Intravenous Q12H    Infusions: . 0.9 % sodium chloride with kcl 125 mL/hr at 09/10/15 0604    No Known Allergies  Social History   Social History  . Marital Status: Married    Spouse Name: N/A  . Number of Children: N/A  . Years of Education: N/A   Occupational History  . Not on file.   Social History Main Topics  . Smoking status: Former Smoker -- 1.00 packs/day for 50 years    Types: Cigarettes    Quit date: 09/24/2014  . Smokeless tobacco: Never Used  . Alcohol Use: No  . Drug Use: No  . Sexual Activity: Not Currently   Other Topics Concern  . Not on file   Social History Narrative    Family History  Problem Relation Age of Onset  . Heart failure Mother     deceased  . Cirrhosis Father     deceased    PHYSICAL EXAM: Filed Vitals:   09/10/15 1221  BP: 108/67  Pulse:  74  Temp: 98.2 F (36.8 C)  Resp: 18     Intake/Output Summary (Last 24 hours) at 09/10/15 1420 Last data filed at 09/10/15 0500  Gross per 24 hour  Intake    760 ml  Output    350 ml  Net    410 ml    General:  Well appearing. No respiratory difficulty HEENT: normal Neck: supple. no JVD. Carotids 2+ bilat; no bruits. No lymphadenopathy or thryomegaly appreciated. Cor: PMI nondisplaced. Regular rate & rhythm. No rubs, gallops or murmurs. Lungs: clear Abdomen: soft, nontender, nondistended. No hepatosplenomegaly. No bruits or masses. Good bowel sounds. Extremities: no cyanosis, clubbing, rash, edema Neuro: alert & oriented x 3, cranial nerves grossly intact. moves all 4 extremities  w/o difficulty. Affect pleasant.  ECG: Prior EKG had no 8 significant EKG changes we are ordering another EKG.  Results for orders placed or performed during the hospital encounter of 09/09/15 (from the past 24 hour(s))  Urine culture     Status: None (Preliminary result)   Collection Time: 09/09/15  6:34 PM  Result Value Ref Range   Specimen Description URINE, CLEAN CATCH    Special Requests NONE    Culture NO GROWTH < 12 HOURS    Report Status PENDING   Urinalysis complete, with microscopic ([facility] only)     Status: Abnormal   Collection Time: 09/09/15  6:34 PM  Result Value Ref Range   Color, Urine YELLOW (A) YELLOW   APPearance CLEAR (A) CLEAR   Glucose, UA NEGATIVE NEGATIVE mg/dL   Bilirubin Urine NEGATIVE NEGATIVE   Ketones, ur NEGATIVE NEGATIVE mg/dL   Specific Gravity, Urine 1.004 (L) 1.005 - 1.030   Hgb urine dipstick NEGATIVE NEGATIVE   pH 6.0 5.0 - 8.0   Protein, ur NEGATIVE NEGATIVE mg/dL   Nitrite NEGATIVE NEGATIVE   Leukocytes, UA TRACE (A) NEGATIVE   RBC / HPF 0-5 0 - 5 RBC/hpf   WBC, UA 0-5 0 - 5 WBC/hpf   Bacteria, UA NONE SEEN NONE SEEN   Squamous Epithelial / LPF NONE SEEN NONE SEEN  CBC     Status: Abnormal   Collection Time: 09/10/15  5:30 AM  Result Value Ref Range   WBC 21.9 (H) 3.8 - 10.6 K/uL   RBC 3.59 (L) 4.40 - 5.90 MIL/uL   Hemoglobin 9.1 (L) 13.0 - 18.0 g/dL   HCT 28.5 (L) 40.0 - 52.0 %   MCV 79.3 (L) 80.0 - 100.0 fL   MCH 25.4 (L) 26.0 - 34.0 pg   MCHC 32.1 32.0 - 36.0 g/dL   RDW 22.8 (H) 11.5 - 14.5 %   Platelets 200 150 - 440 K/uL  Comprehensive metabolic panel     Status: Abnormal   Collection Time: 09/10/15  5:30 AM  Result Value Ref Range   Sodium 134 (L) 135 - 145 mmol/L   Potassium 4.5 3.5 - 5.1 mmol/L   Chloride 110 101 - 111 mmol/L   CO2 19 (L) 22 - 32 mmol/L   Glucose, Bld 105 (H) 65 - 99 mg/dL   BUN 32 (H) 6 - 20 mg/dL   Creatinine, Ser 4.91 (H) 0.61 - 1.24 mg/dL   Calcium 7.0 (L) 8.9 - 10.3 mg/dL   Total Protein 4.1 (L) 6.5  - 8.1 g/dL   Albumin 2.1 (L) 3.5 - 5.0 g/dL   AST 12 (L) 15 - 41 U/L   ALT 7 (L) 17 - 63 U/L   Alkaline Phosphatase 191 (H) 38 - 126 U/L  Total Bilirubin 0.9 0.3 - 1.2 mg/dL   GFR calc non Af Amer 11 (L) >60 mL/min   GFR calc Af Amer 13 (L) >60 mL/min   Anion gap 5 5 - 15  Magnesium     Status: Abnormal   Collection Time: 09/10/15  5:30 AM  Result Value Ref Range   Magnesium 1.6 (L) 1.7 - 2.4 mg/dL   Ct Abdomen Pelvis Wo Contrast  09/09/2015   CLINICAL DATA:  Initial encounter for increasing left upper quadrant pain in a patient with history of lymphoma. 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  EXAM: CT ABDOMEN AND PELVIS WITHOUT CONTRAST  TECHNIQUE: Multidetector CT imaging of the abdomen and pelvis was performed following the standard protocol without IV contrast.  COMPARISON:  08/29/2015.  FINDINGS: Lower chest: Emphysema with chronic interstitial changes noted in the lung bases. A posterior focus of collapse/consolidation is seen in the left lower lobe. 2.1 cm nodular pleural-based lesion is seen at the dome of the left hemidiaphragm.  Hepatobiliary: Nodularity along the anterior liver margin raises the question of cirrhosis. There is a new 2.2 cm low-density lesion in the lateral segment left liver (image 13 series 2). A new 2.4 cm lesion is seen in the inferior right liver (image 30). These are suspicious for metastatic disease given the rapid interval appearance. There is no evidence for gallstones, gallbladder wall thickening, or pericholecystic fluid. No intrahepatic or extrahepatic biliary dilation.  Pancreas: No focal mass lesion. No dilatation of the main duct. No intraparenchymal cyst. No peripancreatic edema.  Spleen: No splenomegaly. No focal mass lesion.  Adrenals/Urinary Tract: Right adrenal gland is unremarkable. 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 Kidneys are unremarkable. No evidence for hydroureter. The urinary  bladder appears normal for the degree of distention.  Stomach/Bowel: Tiny hiatal hernia noted. Stomach otherwise unremarkable. No small bowel wall thickening. No small bowel dilatation. The terminal ileum is normal. The appendix is normal. Large lesion involving the splenic flexure of the colon again noted. No evidence for obstruction.  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 Small nodules are seen along the margin of the large splenic flexure lesion with a conglomeration of lymph nodes identified in the central small bowel mesentery measuring 2.9 x 3.4 cm.  Reproductive: Prostate gland is mildly enlarged.  Other: Small volume intraperitoneal free fluid is evident.  Musculoskeletal: Bone windows reveal no worrisome lytic or sclerotic osseous lesions.  IMPRESSION: 1. No substantial change in the large necrotic/perforated splenic flexure mass with adjacent nodularity and central mesenteric lymphadenopathy. 2. Interval development of hypo attenuating lesions in the liver parenchyma, suspicious for metastatic disease. 3. Interval development of small volume intraperitoneal ascites 4. 3.9 cm abdominal aortic aneurysm   Electronically Signed   By: Misty Stanley M.D.   On: 09/09/2015 16:39     ASSESSMENT AND PLAN: Mass of the colon with lymphadenopathy adenopathy requiring surgery with history of mild LV dysfunction on echocardiogram done in the office last Friday ejection fraction was 43%. Patient denies any chest pain shortness of breath orthopnea or PND. Patient is low risk to moderate risk for above surgery for cardiac complication. The risk of surgery and the type of procedure would be the risk. Cardiac point of view he appears to be quite stable. Advise not to give too much fluids postoperatively.  Tylar Amborn A

## 2015-09-10 NOTE — Progress Notes (Signed)
Patient ID: Craig Page, male   DOB: 1946/11/03, 69 y.o.   MRN: [id number] Discussed with pt, nephrology and Dr. Dr. Oliva Bustard. Appears pt has acute renal failure but he is starting to make urine. Nephrology feels this can be handled after surgery Will proceed with laparoscopy and/or laparotomy. Colostomy with or without resection of splenic flexure. Pt is agreeable.

## 2015-09-10 NOTE — Progress Notes (Signed)
Initial Nutrition Assessment   INTERVENTION:   Coordination of Care: await diet progression as medically able Medical Food Supplement Therapy: will recommend Ensure when diet advanced as pt was drinking PTA   NUTRITION DIAGNOSIS:   Inadequate oral intake related to inability to eat as evidenced by NPO status.  GOAL:   Patient will meet greater than or equal to 90% of their needs  MONITOR:    (Energy Intake, Electrolyte and renal Profile, Digestive System, Anthropometrics)  REASON FOR ASSESSMENT:   Malnutrition Screening Tool    ASSESSMENT:   Pt admitted with colonic obstruction, per MD note scheduled for surgical intervention. Pt with h/o malignant non-Hodgkins lymphoma.  Past Medical History  Diagnosis Date  . CAD (coronary artery disease)     3v  . Ischemic cardiomyopathy   . Chronic systolic heart failure ([place])   . HTN (hypertension)   . HLD (hyperlipidemia)   . Follicular lymphoma ([place])   . Myocardial infarction [facility]) 1992    treated with thrombolytics/notes 10/16/2014    Diet Order:  Diet NPO time specified Except for: Ice Chips, Sips with Meds    Current Nutrition: Pt NPO currently, pt reports eating last night but not really tolerating well   Food/Nutrition-Related History: Pt reports poor appetite PTA, eating 2 meals per day usually. Pt reports drinking Ensure PTA as well.   Medications: NS with KCl at 120mL/hr, Protonix  Electrolyte/Renal Profile and Glucose Profile:   Recent Labs Lab 09/06/15 1255 09/09/15 1125 09/10/15 0530  NA 137 130* 134*  K 4.0 3.8 4.5  CL 106 100* 110  CO2 25 19* 19*  BUN 11 32* 32*  CREATININE 1.25* 4.88* 4.91*  CALCIUM 7.6* 7.0* 7.0*  MG  --   --  1.6*  GLUCOSE 110* 159* 105*   Protein Profile:  Recent Labs Lab 09/06/15 1255 09/09/15 1125 09/10/15 0530  ALBUMIN 2.4* 2.3* 2.1*    Gastrointestinal Profile: Last BM:  09/09/2015   Nutrition-Focused Physical Exam Findings: Nutrition-Focused physical exam  completed. Findings are no fat depletion, mild temple muscle depletion, and no edema.    Weight Change: Pt reports 40lbs weight loss 2 years ago when he had a bacterial infection (20% weight loss), Per [facility] pt with 8% weight loss in the past year.   Skin:  Reviewed, no issues  Height:   Ht Readings from Last 1 Encounters:  09/09/15 5\' 11"  (1.803 m)    Weight:   Wt Readings from Last 1 Encounters:  09/09/15 165 lb 1.6 oz (74.889 kg)    Wt Readings from Last 10 Encounters:  09/09/15 165 lb 1.6 oz (74.889 kg)  09/06/15 164 lb (74.39 kg)  08/30/15 164 lb (74.39 kg)  08/27/15 162 lb 6 oz (73.653 kg)  12/03/14 170 lb (77.111 kg)  10/31/14 173 lb 4 oz (78.586 kg)  10/24/14 180 lb 8.9 oz (81.9 kg)    BMI:  Body mass index is 23.04 kg/(m^2).  Estimated Nutritional Needs:   Kcal:  BEE: 1536kcals, TEE: (IF 1.1-1.3)(AF 1.2) [id number]  Protein:  75-82g protein (1.0-1.2g/kg)  Fluid:  1873-2219mL of fluid (25-12mL/kg)  EDUCATION NEEDS:   Education needs no appropriate at this time    [place], RD, LDN Pager [phone]

## 2015-09-10 NOTE — Progress Notes (Signed)
Consent signed by pt, wife at bedside. RN called OR to let them know consent was obtained, SCD's applied.

## 2015-09-10 NOTE — Care Management Important Message (Signed)
Important Message  Patient Details  Name: CONALL VANGORDER MRN: [id number] Date of Birth: [birth year]   Medicare Important Message Given:  Yes-second notification given    Shelbie Ammons, RN 09/10/2015, 8:42 AM

## 2015-09-10 NOTE — Progress Notes (Signed)
So was admitted in the hospital to see detail history and physical from the hospital note  [place] @ [facility] [phone]  [phone]) [place] OB: 1946/08/26  MR#: [id number]  [facility]#:[id number]  Patient Care Team: Arlis Porta., MD as PCP - General (Family Medicine)  CHIEF COMPLAINT:   1 acute renal failure 2.  Perforated colon with contained perforation Possibility of primary colon cancer with metastases to the lymph node and liver metastases versus lymphoma recurrent disease 3.  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  Previous history of coronary artery disease being managed by cardiologist  Oncology Flowsheet 10/17/2014 10/18/2014 10/19/2014  diazepam (VALIUM) PO - - 5 mg  enoxaparin (LOVENOX) Twain 40 mg 40 mg -    INTERVAL HISTORY: Patient was admitted in hospital with progressive renal failure feeling week tired increasing abdominal pain.  Over last 24 hours intravenous fluid patient has developed more ascites most swelling of lower extremity with progressive increase in serum creatinine. Abdominal pain has improved.  Patient is feeling better. REVIEW OF SYSTEMS:    general status: Patient is feeling weak and tired.  No change in a performance status.  No chills.  No fever. HEENT:   No evidence of stomatitis Lungs: No cough or shortness of breath Cardiac: No chest pain or paroxysmal nocturnal dyspnea GI: Increasing abdominal distention. Increasing abdominal pain Skin: No rash Lower extremity swelling of lower extremity Neurological system: No tingling.  No numbness.  No other focal signs Musculoskeletal system no bony pains  As per HPI. Otherwise, a complete review of systems is negatve.  PAST MEDICAL HISTORY: Past Medical History  Diagnosis Date  . CAD (coronary artery disease)     3v  . Ischemic cardiomyopathy   . Chronic systolic heart failure ([place])   . HTN  (hypertension)   . HLD (hyperlipidemia)   . Follicular lymphoma ([place])   . Myocardial infarction [facility]) 1992    treated with thrombolytics/notes 10/16/2014    PAST SURGICAL HISTORY: Past Surgical History  Procedure Laterality Date  . Hernia repair    . Umbilical hernia repair  1964  . Coronary artery bypass graft N/A 10/19/2014    Procedure: CORONARY ARTERY BYPASS GRAFTING (CABG);  Surgeon: Ivin Poot, MD;  Location: [place];  Service: Open Heart Surgery;  Laterality: N/A;  Times 3 using left internal mammary artery and endoscopically harvested left saphenous vein  . Intraoperative transesophageal echocardiogram N/A 10/19/2014    Procedure: INTRAOPERATIVE TRANSESOPHAGEAL ECHOCARDIOGRAM;  Surgeon: Ivin Poot, MD;  Location: [place];  Service: Open Heart Surgery;  Laterality: N/A;    FAMILY HISTORY Family History  Problem Relation Age of Onset  . Heart failure Mother     deceased  . Cirrhosis Father     deceased    ADVANCED DIRECTIVES:  No flowsheet data found.  HEALTH MAINTENANCE: Social History  Substance Use Topics  . Smoking status: Former Smoker -- 1.00 packs/day for 50 years    Types: Cigarettes    Quit date: 09/24/2014  . Smokeless tobacco: Never Used  . Alcohol Use: No      No Known Allergies  Current Facility-Administered Medications  Medication Dose Route Frequency Provider Last Rate Last Dose  . morphine 2 MG/ML injection 2 mg  2 mg Intravenous Q2H PRN Lloyd Huger, MD   2 mg at 09/10/15 0256  . ondansetron (ZOFRAN) 4 mg in sodium chloride 0.9 % 50 mL  IVPB   Intravenous Q4H PRN Forest Gleason, MD      . pantoprazole (PROTONIX) injection 40 mg  40 mg Intravenous Daily Forest Gleason, MD   40 mg at 09/09/15 1611  . piperacillin-tazobactam (ZOSYN) IVPB 3.375 g  3.375 g Intravenous Q12H Forest Gleason, MD   3.375 g at 09/10/15 1303  . sodium chloride 0.9 % 1,000 mL with potassium chloride 30 mEq infusion   Intravenous Continuous Christene Lye, MD 125  mL/hr at 09/10/15 0604     Facility-Administered Medications Ordered in Other Encounters  Medication Dose Route Frequency Provider Last Rate Last Dose  . esmolol (BREVIBLOC) injection    Anesthesia Intra-op Rolla Plate, CRNA   20 mg at 09/10/15 1657  . fentaNYL (SUBLIMAZE) injection    Anesthesia Intra-op Rolla Plate, CRNA   50 mcg at 09/10/15 1649  . lactated ringers infusion    Continuous PRN Rolla Plate, CRNA      . lidocaine (cardiac) 100 mg/34m (XYLOCAINE) 20 MG/ML injection 2%   Intravenous Anesthesia Intra-op BRolla Plate CRNA   50 mg at 09/10/15 1555  . phenylephrine (NEO-SYNEPHRINE) injection   Intravenous Anesthesia Intra-op BRolla Plate CRNA   100 mcg at 09/10/15 1636  . propofol (DIPRIVAN) 10 mg/mL bolus/IV push    Anesthesia Intra-op BRolla Plate CRNA   150 mg at 09/10/15 1555  . rocuronium ([facility] injection    Anesthesia Intra-op BRolla Plate CRNA   20 mg at 09/10/15 1627  . succinylcholine (ANECTINE) injection   Intravenous Anesthesia Intra-op BRolla Plate CRNA   100 mg at 09/10/15 1555    OBJECTIVE:  Filed Vitals:   09/10/15 1221  BP: 108/67  Pulse: 74  Temp: 98.2 F (36.8 C)  Resp: 18     Body mass index is 23.04 kg/(m^2).    ECOG FS:2 - Symptomatic, <50% confined to bed  PHYSICAL EXAM: Patient is lying in the bed not very uncomfortable. Performance status is 2 Lungs occasional crepitations.  Cardiac: Irregular heart sounds soft systolic murmur.  Abdomen: Swollen.  Ascites.  Palpable mass in the left upper abdominal area.  Also the present.  Lower extremity edema. Neurological system no localizing sign Skin: No rash Lymphatic system: Supraclavicular, cervical, axillary, inguinal lymph nodes are not palpable Head exam was generally normal. There was no scleral icterus or corneal arcus. Mucous membranes were moist.    LAB RESULTS:  CBC Latest Ref Rng 09/10/2015 09/09/2015  WBC 3.8 - 10.6 K/uL 21.9(H) 19.3(H)  Hemoglobin 13.0 - 18.0 g/dL  9.1(L) 9.9(L)  Hematocrit 40.0 - 52.0 % 28.5(L) 31.1(L)  Platelets 150 - 440 K/uL 200 203    Admission on 09/09/2015  Component Date Value Ref Range Status  . Specimen Description 09/09/2015 URINE, CLEAN CATCH   Final  . Special Requests 09/09/2015 NONE   Final  . Culture 09/09/2015 NO GROWTH < 12 HOURS   Final  . Report Status 09/09/2015 PENDING   Incomplete  . Color, Urine 09/09/2015 YELLOW* YELLOW Final  . APPearance 09/09/2015 CLEAR* CLEAR Final  . Glucose, UA 09/09/2015 NEGATIVE  NEGATIVE mg/dL Final  . Bilirubin Urine 09/09/2015 NEGATIVE  NEGATIVE Final  . Ketones, ur 09/09/2015 NEGATIVE  NEGATIVE mg/dL Final  . Specific Gravity, Urine 09/09/2015 1.004* 1.005 - 1.030 Final  . Hgb urine dipstick 09/09/2015 NEGATIVE  NEGATIVE Final  . pH 09/09/2015 6.0  5.0 - 8.0 Final  . Protein, ur 09/09/2015 NEGATIVE  NEGATIVE mg/dL Final  . Nitrite 09/09/2015 NEGATIVE  NEGATIVE Final  . Leukocytes, UA 09/09/2015  TRACE* NEGATIVE Final  . RBC / HPF 09/09/2015 0-5  0 - 5 RBC/hpf Final  . WBC, UA 09/09/2015 0-5  0 - 5 WBC/hpf Final  . Bacteria, UA 09/09/2015 NONE SEEN  NONE SEEN Final  . Squamous Epithelial / LPF 09/09/2015 NONE SEEN  NONE SEEN Final  . WBC 09/10/2015 21.9* 3.8 - 10.6 K/uL Final  . RBC 09/10/2015 3.59* 4.40 - 5.90 MIL/uL Final  . Hemoglobin 09/10/2015 9.1* 13.0 - 18.0 g/dL Final  . HCT 09/10/2015 28.5* 40.0 - 52.0 % Final  . MCV 09/10/2015 79.3* 80.0 - 100.0 fL Final  . MCH 09/10/2015 25.4* 26.0 - 34.0 pg Final  . MCHC 09/10/2015 32.1  32.0 - 36.0 g/dL Final  . RDW 09/10/2015 22.8* 11.5 - 14.5 % Final  . Platelets 09/10/2015 200  150 - 440 K/uL Final  . Sodium 09/10/2015 134* 135 - 145 mmol/L Final  . Potassium 09/10/2015 4.5  3.5 - 5.1 mmol/L Final  . Chloride 09/10/2015 110  101 - 111 mmol/L Final  . CO2 09/10/2015 19* 22 - 32 mmol/L Final  . Glucose, Bld 09/10/2015 105* 65 - 99 mg/dL Final  . BUN 09/10/2015 32* 6 - 20 mg/dL Final  . Creatinine, Ser 09/10/2015 4.91*  0.61 - 1.24 mg/dL Final  . Calcium 09/10/2015 7.0* 8.9 - 10.3 mg/dL Final  . Total Protein 09/10/2015 4.1* 6.5 - 8.1 g/dL Final  . Albumin 09/10/2015 2.1* 3.5 - 5.0 g/dL Final  . AST 09/10/2015 12* 15 - 41 U/L Final  . ALT 09/10/2015 7* 17 - 63 U/L Final  . Alkaline Phosphatase 09/10/2015 191* 38 - 126 U/L Final  . Total Bilirubin 09/10/2015 0.9  0.3 - 1.2 mg/dL Final  . GFR calc non Af Amer 09/10/2015 11* >60 mL/min Final  . GFR calc Af Amer 09/10/2015 13* >60 mL/min Final   Comment: (NOTE) The eGFR has been calculated using the CKD EPI equation. This calculation has not been validated in all clinical situations. eGFR's persistently <60 mL/min signify possible Chronic Kidney Disease.   . Anion gap 09/10/2015 5  5 - 15 Final  . Magnesium 09/10/2015 1.6* 1.7 - 2.4 mg/dL Final  Appointment on 09/09/2015  Component Date Value Ref Range Status  . WBC 09/09/2015 19.3* 3.8 - 10.6 K/uL Final  . RBC 09/09/2015 3.88* 4.40 - 5.90 MIL/uL Final  . Hemoglobin 09/09/2015 9.9* 13.0 - 18.0 g/dL Final  . HCT 09/09/2015 31.1* 40.0 - 52.0 % Final  . MCV 09/09/2015 80.0  80.0 - 100.0 fL Final  . MCH 09/09/2015 25.6* 26.0 - 34.0 pg Final  . MCHC 09/09/2015 32.0  32.0 - 36.0 g/dL Final  . RDW 09/09/2015 23.2* 11.5 - 14.5 % Final  . Platelets 09/09/2015 203  150 - 440 K/uL Final  . Neutrophils Relative % 09/09/2015 83   Final  . Neutro Abs 09/09/2015 16.0* 1.4 - 6.5 K/uL Final  . Lymphocytes Relative 09/09/2015 7   Final  . Lymphs Abs 09/09/2015 1.3  1.0 - 3.6 K/uL Final  . Monocytes Relative 09/09/2015 7   Final  . Monocytes Absolute 09/09/2015 1.2* 0.2 - 1.0 K/uL Final  . Eosinophils Relative 09/09/2015 3   Final  . Eosinophils Absolute 09/09/2015 0.6  0 - 0.7 K/uL Final  . Basophils Relative 09/09/2015 0   Final  . Basophils Absolute 09/09/2015 0.1  0 - 0.1 K/uL Final  . Sodium 09/09/2015 130* 135 - 145 mmol/L Final  . Potassium 09/09/2015 3.8  3.5 - 5.1 mmol/L Final  .  Chloride 09/09/2015 100*  101 - 111 mmol/L Final  . CO2 09/09/2015 19* 22 - 32 mmol/L Final  . Glucose, Bld 09/09/2015 159* 65 - 99 mg/dL Final  . BUN 09/09/2015 32* 6 - 20 mg/dL Final  . Creatinine, Ser 09/09/2015 4.88* 0.61 - 1.24 mg/dL Final  . Calcium 09/09/2015 7.0* 8.9 - 10.3 mg/dL Final  . Total Protein 09/09/2015 4.9* 6.5 - 8.1 g/dL Final  . Albumin 09/09/2015 2.3* 3.5 - 5.0 g/dL Final  . AST 09/09/2015 15  15 - 41 U/L Final  . ALT 09/09/2015 7* 17 - 63 U/L Final  . Alkaline Phosphatase 09/09/2015 187* 38 - 126 U/L Final  . Total Bilirubin 09/09/2015 0.9  0.3 - 1.2 mg/dL Final  . GFR calc non Af Amer 09/09/2015 11* >60 mL/min Final  . GFR calc Af Amer 09/09/2015 13* >60 mL/min Final   Comment: (NOTE) The eGFR has been calculated using the CKD EPI equation. This calculation has not been validated in all clinical situations. eGFR's persistently <60 mL/min signify possible Chronic Kidney Disease.   . Anion gap 09/09/2015 11  5 - 15 Final  Admission on 09/06/2015, Discharged on 09/06/2015  Component Date Value Ref Range Status  . WBC 09/06/2015 20.8* 3.8 - 10.6 K/uL Final  . RBC 09/06/2015 3.85* 4.40 - 5.90 MIL/uL Final  . Hemoglobin 09/06/2015 9.8* 13.0 - 18.0 g/dL Final  . HCT 09/06/2015 30.5* 40.0 - 52.0 % Final  . MCV 09/06/2015 79.1* 80.0 - 100.0 fL Final  . MCH 09/06/2015 25.3* 26.0 - 34.0 pg Final  . MCHC 09/06/2015 32.0  32.0 - 36.0 g/dL Final  . RDW 09/06/2015 20.6* 11.5 - 14.5 % Final  . Platelets 09/06/2015 226  150 - 440 K/uL Final  . Sodium 09/06/2015 137  135 - 145 mmol/L Final  . Potassium 09/06/2015 4.0  3.5 - 5.1 mmol/L Final  . Chloride 09/06/2015 106  101 - 111 mmol/L Final  . CO2 09/06/2015 25  22 - 32 mmol/L Final  . Glucose, Bld 09/06/2015 110* 65 - 99 mg/dL Final  . BUN 09/06/2015 11  6 - 20 mg/dL Final  . Creatinine, Ser 09/06/2015 1.25* 0.61 - 1.24 mg/dL Final  . Calcium 09/06/2015 7.6* 8.9 - 10.3 mg/dL Final  . Total Protein 09/06/2015 4.8* 6.5 - 8.1 g/dL Final  . Albumin  09/06/2015 2.4* 3.5 - 5.0 g/dL Final  . AST 09/06/2015 15  15 - 41 U/L Final  . ALT 09/06/2015 8* 17 - 63 U/L Final  . Alkaline Phosphatase 09/06/2015 131* 38 - 126 U/L Final  . Total Bilirubin 09/06/2015 0.8  0.3 - 1.2 mg/dL Final  . GFR calc non Af Amer 09/06/2015 57* >60 mL/min Final  . GFR calc Af Amer 09/06/2015 >60  >60 mL/min Final   Comment: (NOTE) The eGFR has been calculated using the CKD EPI equation. This calculation has not been validated in all clinical situations. eGFR's persistently <60 mL/min signify possible Chronic Kidney Disease.   . Anion gap 09/06/2015 6  5 - 15 Final  . Lipase 09/06/2015 11* 22 - 51 U/L Final  . Troponin I 09/06/2015 <0.03  <0.031 ng/mL Final   Comment:        NO INDICATION OF MYOCARDIAL INJURY.        STUDIES: Ct Abdomen Pelvis Wo Contrast  09/09/2015   CLINICAL DATA:  Initial encounter for increasing left upper quadrant pain in a patient with history of lymphoma. More recently patient was diagnosed with perforated colon  cancer of the splenic flexure.  EXAM: CT ABDOMEN AND PELVIS WITHOUT CONTRAST  TECHNIQUE: Multidetector CT imaging of the abdomen and pelvis was performed following the standard protocol without IV contrast.  COMPARISON:  08/29/2015.  FINDINGS: Lower chest: Emphysema with chronic interstitial changes noted in the lung bases. A posterior focus of collapse/consolidation is seen in the left lower lobe. 2.1 cm nodular pleural-based lesion is seen at the dome of the left hemidiaphragm.  Hepatobiliary: Nodularity along the anterior liver margin raises the question of cirrhosis. There is a new 2.2 cm low-density lesion in the lateral segment left liver (image 13 series 2). A new 2.4 cm lesion is seen in the inferior right liver (image 30). These are suspicious for metastatic disease given the rapid interval appearance. There is no evidence for gallstones, gallbladder wall thickening, or pericholecystic fluid. No intrahepatic or extrahepatic  biliary dilation.  Pancreas: No focal mass lesion. No dilatation of the main duct. No intraparenchymal cyst. No peripancreatic edema.  Spleen: No splenomegaly. No focal mass lesion.  Adrenals/Urinary Tract: Right adrenal gland is unremarkable. 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 Kidneys are unremarkable. No evidence for hydroureter. The urinary bladder appears normal for the degree of distention.  Stomach/Bowel: Tiny hiatal hernia noted. Stomach otherwise unremarkable. No small bowel wall thickening. No small bowel dilatation. The terminal ileum is normal. The appendix is normal. Large lesion involving the splenic flexure of the colon again noted. No evidence for obstruction.  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 Small nodules are seen along the margin of the large splenic flexure lesion with a conglomeration of lymph nodes identified in the central small bowel mesentery measuring 2.9 x 3.4 cm.  Reproductive: Prostate gland is mildly enlarged.  Other: Small volume intraperitoneal free fluid is evident.  Musculoskeletal: Bone windows reveal no worrisome lytic or sclerotic osseous lesions.  IMPRESSION: 1. No substantial change in the large necrotic/perforated splenic flexure mass with adjacent nodularity and central mesenteric lymphadenopathy. 2. Interval development of hypo attenuating lesions in the liver parenchyma, suspicious for metastatic disease. 3. Interval development of small volume intraperitoneal ascites 4. 3.9 cm abdominal aortic aneurysm   Electronically Signed   By: Misty Stanley M.D.   On: 09/09/2015 16:39   Nm Pet Image Restag (ps) Skull Base To Thigh  08/29/2015   CLINICAL DATA:  Subsequent treatment strategy for lymphoma. Abdominal pain, diarrhea, leukocytosis, and splenomegaly.  EXAM: NUCLEAR MEDICINE PET SKULL BASE TO THIGH  TECHNIQUE: 11.2 mCi F-18 FDG was injected intravenously. Full-ring PET imaging was performed from the skull base to  thigh after the radiotracer. CT data was obtained and used for attenuation correction and anatomic localization.  FASTING BLOOD GLUCOSE:  Value: 92 mg/dl  COMPARISON:  Chest CT on 10/17/2014 and PET-CT on 02/12/2014  FINDINGS: NECK  No hypermetabolic lymph nodes in the neck.  CHEST  A 6 mm hypermetabolic mediastinal lymph node is seen just posterior to the lower thoracic esophagus on image 89 of series 3, which is hypermetabolic with SUV max of 7.1. This is new since previous study. No other hypermetabolic lymph nodes identified within the thorax. No suspicious pulmonary nodules seen on CT. Emphysema noted.  ABDOMEN/PELVIS  Small hypermetabolic soft tissue nodules are seen along the capsular surface of the liver and spleen. Largest is seen along the anterior aspect of the left hepatic lobe measuring 1.6 cm on image 121 of series 3, with SUV max of 8.2. There is also a tiny hypermetabolic  focus in the left pelvic cul-de-sac which has an SUV max of 5.0 on image 63 of series 4. Trace amount of ascites is seen. These findings are highly suspicious with peritoneal carcinoma.  A 10.2 cm mass is seen in the left upper quadrant which shows a thick hypermetabolic rim and stool or other particular debris centrally. This involves the splenic flexure of the colon and is new since previous study. SUV max measures 26.5. This is suspicious for a perforated colon carcinoma.  Hypermetabolic lymphadenopathy is seen in the central abdominal mesentery medial to this mass, which measures 2.7 cm on image 145/series 3 with SUV max of 9.6. There is also a 8 mm hypermetabolic retroperitoneal lymph node in the aortocaval space on image 151/series 3, which has SUV max of 14.0. No hypermetabolic lymphadenopathy seen within the pelvis.  4.0 cm infrarenal abdominal aortic aneurysm is again seen, without significant change.  SKELETON  No focal hypermetabolic activity to suggest skeletal metastasis.  IMPRESSION: 10 cm left upper quadrant  peripherally hypermetabolic mass with central stool or necrotic debris which involves the splenic flexure the colon. This is suspicious for perforated colon carcinoma.  Hypermetabolic peritoneal soft tissue nodules in the abdomen and pelvis and minimal ascites, consistent with peritoneal metastatic disease.  Mild hypermetabolic mesenteric and retroperitoneal lymphadenopathy, consistent with metastatic disease.  6 mm hypermetabolic mediastinal lymph node just posterior to the lower thoracic esophagus. Metastatic disease cannot be excluded.  Stable 4.0 cm infrarenal abdominal aortic aneurysm.   Electronically Signed   By: Earle Gell M.D.   On: 08/29/2015 14:51    ASSESSMENT: 1.  Acute renal failure.  Intravenous fluid is resulting in to third spacing and not effectively raising intravascular fluid balance. Nephrology consult has been appreciated. Will monitor serum creatinine in urine output 2.  Perforated colon which is contained but leukocytosis persists.  On IV antibiotics. 3.  Congestive heart failure with coronary artery disease  All lab data has been reviewed. I had prolonged discussion with patient, beats surgeon Dr. Jamal Collin, with nephrologist At this point in time was decided to proceed with laparoscopy surgery make diagnosis if needed colostomy Monitor renal function Monitor lab data or leukocytosis   If diagnosis is recurrent lymphoma will start patient on IV steroid and consider chemotherapy I have discussed possibility of putting a permanent venous access.   Patient expressed understanding and was in agreement with this plan. He also understands that He can call clinic at any time with any questions, concerns, or complaints.    No matching staging information was found for the patient.  Forest Gleason, MD   09/10/2015 5:08 PM

## 2015-09-10 NOTE — Plan of Care (Signed)
Problem: Discharge Progression Outcomes Goal: Discharge plan in place and appropriate Outcome: Progressing Consent has been signed for laparotomy  And possible bowel resection with colostomy, wife at the bedside Pt may transfer to unit or 2c after surgery Wife at the bedside and she is aware of surgery Pt is having pain (9/10) to luq Foley in place

## 2015-09-10 NOTE — Consult Note (Signed)
Reason for Consult: No chief complaint on file.  Referring Physician: dr.Choksi  Craig Page is an 69 y.o. male.  HPI: 69 year old Caucasian male with past medical history of coronary artery disease, ischemic cardiomyopathy, chronic congestive heart failure with ejection fraction 43%, sees Dr. Neoma Laming as an outpatient, history of follicular lymphoma sees Dr. Donnetta Hail is admitted to Dr. Donnetta Hail service. Patient was noted to have findings of contained perforation of the splenic flexure about 10 days ago. He was treated with antibiotics with some clinical improvement, he was admitted to the hospital for interval colon resection. Hospitalist team is consulted for medical management. During my examination patient was complaining of abdominal pain with distention, otherwise no complaints  Past Medical History  Diagnosis Date  . CAD (coronary artery disease)     3v  . Ischemic cardiomyopathy   . Chronic systolic heart failure ([place])   . HTN (hypertension)   . HLD (hyperlipidemia)   . Follicular lymphoma ([place])   . Myocardial infarction [facility]) 1992    treated with thrombolytics/notes 10/16/2014    Past Surgical History  Procedure Laterality Date  . Hernia repair    . Umbilical hernia repair  1964  . Coronary artery bypass graft N/A 10/19/2014    Procedure: CORONARY ARTERY BYPASS GRAFTING (CABG);  Surgeon: Ivin Poot, MD;  Location: [place];  Service: Open Heart Surgery;  Laterality: N/A;  Times 3 using left internal mammary artery and endoscopically harvested left saphenous vein  . Intraoperative transesophageal echocardiogram N/A 10/19/2014    Procedure: INTRAOPERATIVE TRANSESOPHAGEAL ECHOCARDIOGRAM;  Surgeon: Ivin Poot, MD;  Location: [place];  Service: Open Heart Surgery;  Laterality: N/A;    Family History  Problem Relation Age of Onset  . Heart failure Mother     deceased  . Cirrhosis Father     deceased    Social History:  reports that he quit smoking about a year ago. His  smoking use included Cigarettes. He has a 50 pack-year smoking history. He has never used smokeless tobacco. He reports that he does not drink alcohol or use illicit drugs.  Allergies: No Known Allergies  Medications: I have reviewed the patient's current medications.  Results for orders placed or performed during the hospital encounter of 09/09/15 (from the past 48 hour(s))  Urine culture     Status: None (Preliminary result)   Collection Time: 09/09/15  6:34 PM  Result Value Ref Range   Specimen Description URINE, CLEAN CATCH    Special Requests NONE    Culture NO GROWTH < 12 HOURS    Report Status PENDING   Urinalysis complete, with microscopic ([facility] only)     Status: Abnormal   Collection Time: 09/09/15  6:34 PM  Result Value Ref Range   Color, Urine YELLOW (A) YELLOW   APPearance CLEAR (A) CLEAR   Glucose, UA NEGATIVE NEGATIVE mg/dL   Bilirubin Urine NEGATIVE NEGATIVE   Ketones, ur NEGATIVE NEGATIVE mg/dL   Specific Gravity, Urine 1.004 (L) 1.005 - 1.030   Hgb urine dipstick NEGATIVE NEGATIVE   pH 6.0 5.0 - 8.0   Protein, ur NEGATIVE NEGATIVE mg/dL   Nitrite NEGATIVE NEGATIVE   Leukocytes, UA TRACE (A) NEGATIVE   RBC / HPF 0-5 0 - 5 RBC/hpf   WBC, UA 0-5 0 - 5 WBC/hpf   Bacteria, UA NONE SEEN NONE SEEN   Squamous Epithelial / LPF NONE SEEN NONE SEEN  CBC     Status: Abnormal   Collection Time: 09/10/15  5:30  AM  Result Value Ref Range   WBC 21.9 (H) 3.8 - 10.6 K/uL   RBC 3.59 (L) 4.40 - 5.90 MIL/uL   Hemoglobin 9.1 (L) 13.0 - 18.0 g/dL   HCT 28.5 (L) 40.0 - 52.0 %   MCV 79.3 (L) 80.0 - 100.0 fL   MCH 25.4 (L) 26.0 - 34.0 pg   MCHC 32.1 32.0 - 36.0 g/dL   RDW 22.8 (H) 11.5 - 14.5 %   Platelets 200 150 - 440 K/uL  Comprehensive metabolic panel     Status: Abnormal   Collection Time: 09/10/15  5:30 AM  Result Value Ref Range   Sodium 134 (L) 135 - 145 mmol/L   Potassium 4.5 3.5 - 5.1 mmol/L   Chloride 110 101 - 111 mmol/L   CO2 19 (L) 22 - 32 mmol/L   Glucose, Bld  105 (H) 65 - 99 mg/dL   BUN 32 (H) 6 - 20 mg/dL   Creatinine, Ser 4.91 (H) 0.61 - 1.24 mg/dL   Calcium 7.0 (L) 8.9 - 10.3 mg/dL   Total Protein 4.1 (L) 6.5 - 8.1 g/dL   Albumin 2.1 (L) 3.5 - 5.0 g/dL   AST 12 (L) 15 - 41 U/L   ALT 7 (L) 17 - 63 U/L   Alkaline Phosphatase 191 (H) 38 - 126 U/L   Total Bilirubin 0.9 0.3 - 1.2 mg/dL   GFR calc non Af Amer 11 (L) >60 mL/min   GFR calc Af Amer 13 (L) >60 mL/min    Comment: (NOTE) The eGFR has been calculated using the CKD EPI equation. This calculation has not been validated in all clinical situations. eGFR's persistently <60 mL/min signify possible Chronic Kidney Disease.    Anion gap 5 5 - 15  Magnesium     Status: Abnormal   Collection Time: 09/10/15  5:30 AM  Result Value Ref Range   Magnesium 1.6 (L) 1.7 - 2.4 mg/dL    Ct Abdomen Pelvis Wo Contrast  09/09/2015   CLINICAL DATA:  Initial encounter for increasing left upper quadrant pain in a patient with history of lymphoma. 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  EXAM: CT ABDOMEN AND PELVIS WITHOUT CONTRAST  TECHNIQUE: Multidetector CT imaging of the abdomen and pelvis was performed following the standard protocol without IV contrast.  COMPARISON:  08/29/2015.  FINDINGS: Lower chest: Emphysema with chronic interstitial changes noted in the lung bases. A posterior focus of collapse/consolidation is seen in the left lower lobe. 2.1 cm nodular pleural-based lesion is seen at the dome of the left hemidiaphragm.  Hepatobiliary: Nodularity along the anterior liver margin raises the question of cirrhosis. There is a new 2.2 cm low-density lesion in the lateral segment left liver (image 13 series 2). A new 2.4 cm lesion is seen in the inferior right liver (image 30). These are suspicious for metastatic disease given the rapid interval appearance. There is no evidence for gallstones, gallbladder wall thickening, or pericholecystic fluid. No  intrahepatic or extrahepatic biliary dilation.  Pancreas: No focal mass lesion. No dilatation of the main duct. No intraparenchymal cyst. No peripancreatic edema.  Spleen: No splenomegaly. No focal mass lesion.  Adrenals/Urinary Tract: Right adrenal gland is unremarkable. 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 Kidneys are unremarkable. No evidence for hydroureter. The urinary bladder appears normal for the degree of distention.  Stomach/Bowel: Tiny hiatal hernia noted. Stomach otherwise unremarkable. No small bowel wall thickening. No small bowel dilatation. The terminal  ileum is normal. The appendix is normal. Large lesion involving the splenic flexure of the colon again noted. No evidence for obstruction.  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 Small nodules are seen along the margin of the large splenic flexure lesion with a conglomeration of lymph nodes identified in the central small bowel mesentery measuring 2.9 x 3.4 cm.  Reproductive: Prostate gland is mildly enlarged.  Other: Small volume intraperitoneal free fluid is evident.  Musculoskeletal: Bone windows reveal no worrisome lytic or sclerotic osseous lesions.  IMPRESSION: 1. No substantial change in the large necrotic/perforated splenic flexure mass with adjacent nodularity and central mesenteric lymphadenopathy. 2. Interval development of hypo attenuating lesions in the liver parenchyma, suspicious for metastatic disease. 3. Interval development of small volume intraperitoneal ascites 4. 3.9 cm abdominal aortic aneurysm   Electronically Signed   By: Misty Stanley M.D.   On: 09/09/2015 16:39    ROS:  CONSTITUTIONAL: Denies fevers, chills. Denies any fatigue, weakness.  EYES: Denies blurry vision, double vision, eye pain. EARS, NOSE, THROAT: Denies tinnitus, ear pain, hearing loss. RESPIRATORY: Denies cough, wheeze, shortness of breath.  CARDIOVASCULAR: Denies chest pain, palpitations, edema.   GASTROINTESTINAL: Reporting generalized abdominal pain. Denies any vomiting or diarrhea. Denies bright red blood per rectum. GENITOURINARY: Denies dysuria, hematuria. ENDOCRINE: Denies nocturia or thyroid problems. HEMATOLOGIC AND LYMPHATIC: Denies easy bruising or bleeding. SKIN: Denies rash or lesion. MUSCULOSKELETAL: Denies pain in neck, back, shoulder, knees, hips or arthritic symptoms.  NEUROLOGIC: Denies paralysis, paresthesias.  PSYCHIATRIC: Denies anxiety or depressive symptoms. Blood pressure 98/59, pulse 81, temperature 97 F (36.1 C), temperature source Oral, resp. rate 19, height _0  (1.803 m), weight 74.889 kg (165 lb 1.6 oz), SpO2 97 %.   PHYSICAL EXAMINATION:  GENERAL: Well-nourished, well-developed , currently in no acute distress.  HEAD: Normocephalic, atraumatic.  EYES: Pupils equal, round, and reactive to light. Extraocular muscles intact. No scleral icterus.  MOUTH: Moist mucosal membranes. Dentition intact. No abscess noted. EARS, NOSE, THROAT: Clear without exudates. No external lesions.  NECK: Supple. No thyromegaly. No nodules. No JVD.  PULMONARY: Clear to auscultation bilaterally without wheezes, rales, or rhonchi. No use of accessory muscles. Good respiratory effort. CHEST: Nontender to palpation.  CARDIOVASCULAR: S1, S2, regular rate and rhythm. No murmurs, rubs, or gallops.  GASTROINTESTINAL: Soft, diffusely tender, could not perform a bout tenderness because of the patient's discomfort. distended.  Left upper quadrant mass, palpable . MUSCULOSKELETAL: No swelling, clubbing, edema. Range of motion full in all extremities. NEUROLOGIC: Cranial nerves II-XII intact. No gross focal neurological deficits. Sensation intact. Reflexes intact. SKIN: No ulcerations, lesions, rash, cyanosis. Skin warm, dry. Turgor intact. PSYCHIATRIC: Mood, affect within normal limits. Patient awake, alert, oriented x 3. Insight and judgment intact.   Assessment/Plan:  1 coronary  artery disease with ischemic cardiomyopathy Patient is asymptomatic Will continue Coreg with holding parameters  2. Chronic systolic congestive heart failure-currently not fluid overloaded Hold Lasix and spironolactone in view of hypotension Monitor closely for symptoms and signs of fluid overload  3. Hypertension, currently patient is hypotensive Continue his blood pressure medications Coreg and amiodarone with holding parameters Discontinue lisinopril as the patient is currently with low blood pressure  4. History of follicular lymphoma with the splenic flexure perforation postop day #0 status post exploratory laparotomy and colon resection Clear liquid diet Continue IV fluids Pain management and postop care per surgery Continue IV antibiotic Zosyn and check a.m. Labs  5. Acute renal failure probably secondary to ATN  Avoid nephrotoxins, provide IV fluids Follow-up with nephrology   6.Tobacco abuse disorder Consultation to quit smoking for 3-5 minutes. Patient is considering to quit smoking. Will provide nicotine patch   Appreciate Dr. Donnetta Hail for consulting hospitalist team for medical management      TOTAL TIME TAKING CARE OF THIS PATIENT: 45 minutes.  _0 @ Pager - [phone] 09/10/2015, 9:37 PM

## 2015-09-10 NOTE — Consult Note (Signed)
[facility]  CONSULT NOTE    Date: 09/10/2015                  Patient Name:  Craig Page  MRN: [id number]  DOB: 1946-11-10  Age / Sex: 69 y.o., male         PCP: [name], MD                 Service Requesting Consult: Dr. Oliva Bustard                 Reason for Consult: Acute Renal Failure            History of Present Illness: Craig Page is a 69 y.o.white male with coronary artery disease, congestive heart failure, hypertension, hyperlipidemia, follicular lymphoma who was admitted to [facility] on 09/09/2015 for acute renal failure.  Patient's baseline creatinine of 0.93 on 09/03/2015. Patient presented to Dr. Oliva Bustard feelign weak and tired. He was thought to be in remission, treated with rituximab in 2012. Creatinine found to have 4.88 on admission. Large lesion on colon identified by CT scan. Nephrology has been consulted for surgical clearance.  Patient states that he has been doing well with appetite, denies any diarrhea.    Medications: Outpatient medications: Prescriptions prior to admission  Medication Sig Dispense Refill Last Dose  . amiodarone (PACERONE) 200 MG tablet Take 1 tablet (200 mg total) by mouth daily. 30 tablet 1 Taking  . carvedilol (COREG) 3.125 MG tablet TAKE 1 TABLET BY MOUTH TWICE DAILY. 180 tablet 4 unknown  . ciprofloxacin (CIPRO) 500 MG tablet Take 1 tablet (500 mg total) by mouth 2 (two) times daily. 20 tablet 0   . lisinopril (PRINIVIL,ZESTRIL) 2.5 MG tablet TAKE 1 TABLET BY MOUTH TWICE DAILY 180 tablet 0 unknown  . metroNIDAZOLE (FLAGYL) 500 MG tablet Take 1 tablet (500 mg total) by mouth every 8 (eight) hours. 30 tablet 0   . ondansetron (ZOFRAN) 4 MG tablet Take 1-2 tablets (4-8 mg total) by mouth every 8 (eight) hours as needed for nausea (not responsive to prochlorperazine (COMPAZINE)). 20 tablet 0   . pantoprazole (PROTONIX) 40 MG tablet Take 40 mg by mouth daily.   unknown  . promethazine (PHENERGAN) 25 MG tablet Take 1  tablet (25 mg total) by mouth every 6 (six) hours as needed for nausea or vomiting. 30 tablet 0   . simethicone (MYLICON) 80 MG chewable tablet Chew 1 tablet by mouth 3 (three) times daily as needed.  1   . spironolactone (ALDACTONE) 25 MG tablet Take 0.5 tablets (12.5 mg total) by mouth daily. 45 tablet 3 Taking    Current medications: Current Facility-Administered Medications  Medication Dose Route Frequency Provider Last Rate Last Dose  . morphine 2 MG/ML injection 2 mg  2 mg Intravenous Q2H PRN Lloyd Huger, MD   2 mg at 09/10/15 0256  . ondansetron (ZOFRAN) 4 mg in sodium chloride 0.9 % 50 mL IVPB   Intravenous Q4H PRN Forest Gleason, MD      . pantoprazole (PROTONIX) injection 40 mg  40 mg Intravenous Daily Forest Gleason, MD   40 mg at 09/09/15 1611  . piperacillin-tazobactam (ZOSYN) IVPB 3.375 g  3.375 g Intravenous Q12H Forest Gleason, MD   3.375 g at 09/10/15 1303  . sodium chloride 0.9 % 1,000 mL with potassium chloride 30 mEq infusion   Intravenous Continuous Christene Lye, MD 125 mL/hr at 09/10/15 [phone]  Allergies: No Known Allergies    Past Medical History: Past Medical History  Diagnosis Date  . CAD (coronary artery disease)     3v  . Ischemic cardiomyopathy   . Chronic systolic heart failure ([place])   . HTN (hypertension)   . HLD (hyperlipidemia)   . Follicular lymphoma ([place])   . Myocardial infarction [facility]) 1992    treated with thrombolytics/notes 10/16/2014     Past Surgical History: Past Surgical History  Procedure Laterality Date  . Hernia repair    . Umbilical hernia repair  1964  . Coronary artery bypass graft N/A 10/19/2014    Procedure: CORONARY ARTERY BYPASS GRAFTING (CABG);  Surgeon: Ivin Poot, MD;  Location: [place];  Service: Open Heart Surgery;  Laterality: N/A;  Times 3 using left internal mammary artery and endoscopically harvested left saphenous vein  . Intraoperative transesophageal echocardiogram N/A 10/19/2014    Procedure:  INTRAOPERATIVE TRANSESOPHAGEAL ECHOCARDIOGRAM;  Surgeon: Ivin Poot, MD;  Location: [place];  Service: Open Heart Surgery;  Laterality: N/A;     Family History: Family History  Problem Relation Age of Onset  . Heart failure Mother     deceased  . Cirrhosis Father     deceased     Social History: Social History   Social History  . Marital Status: Married    Spouse Name: N/A  . Number of Children: N/A  . Years of Education: N/A   Occupational History  . Not on file.   Social History Main Topics  . Smoking status: Former Smoker -- 1.00 packs/day for 50 years    Types: Cigarettes    Quit date: 09/24/2014  . Smokeless tobacco: Never Used  . Alcohol Use: No  . Drug Use: No  . Sexual Activity: Not Currently   Other Topics Concern  . Not on file   Social History Narrative     Review of Systems: Review of Systems  Constitutional: Positive for malaise/fatigue and diaphoresis.  HENT: Negative for congestion, ear discharge, ear pain, hearing loss, nosebleeds, sore throat and tinnitus.   Eyes: Negative for blurred vision, double vision, photophobia, pain, discharge and redness.  Respiratory: Negative for cough, hemoptysis, sputum production, shortness of breath, wheezing and stridor.   Cardiovascular: Negative for chest pain, palpitations, orthopnea, claudication, leg swelling and PND.  Gastrointestinal: Positive for abdominal pain. Negative for heartburn, nausea, vomiting, diarrhea, constipation, blood in stool and melena.  Genitourinary: Negative for dysuria, urgency, frequency, hematuria and flank pain.  Musculoskeletal: Negative for myalgias, back pain, joint pain, falls and neck pain.  Skin: Negative for itching and rash.  Neurological: Positive for weakness. Negative for dizziness, tingling, tremors, sensory change, speech change, focal weakness, seizures, loss of consciousness and headaches.  Endo/Heme/Allergies: Negative for environmental allergies and polydipsia.  Does not bruise/bleed easily.  Psychiatric/Behavioral: Negative for depression, suicidal ideas, hallucinations, memory loss and substance abuse. The patient is not nervous/anxious and does not have insomnia.     Vital Signs: Blood pressure 108/67, pulse 74, temperature 98.2 F (36.8 C), temperature source Oral, resp. rate 18, height 5\' 11"  (1.803 m), weight 74.889 kg (165 lb 1.6 oz), SpO2 96 %.  Weight trends: Filed Weights   09/09/15 1402  Weight: 74.889 kg (165 lb 1.6 oz)    Physical Exam: General: NAD, slender male  Head: Normocephalic, atraumatic. Moist oral mucosal membranes  Eyes: Anicteric, PERRL  Neck: Supple, trachea midline  Lungs:  Clear to auscultation  Heart: Regular rate and rhythm  Abdomen:  Tender left  upper quadrant   Extremities: no peripheral edema.  Neurologic: Nonfocal, moving all four extremities  Skin: No lesions  GU Foley with urine     Lab results: Basic Metabolic Panel:  Recent Labs Lab 09/06/15 1255 09/09/15 1125 09/10/15 0530  NA 137 130* 134*  K 4.0 3.8 4.5  CL 106 100* 110  CO2 25 19* 19*  GLUCOSE 110* 159* 105*  BUN 11 32* 32*  CREATININE 1.25* 4.88* 4.91*  CALCIUM 7.6* 7.0* 7.0*  MG  --   --  1.6*    Liver Function Tests:  Recent Labs Lab 09/06/15 1255 09/09/15 1125 09/10/15 0530  AST 15 15 12*  ALT 8* 7* 7*  ALKPHOS 131* 187* 191*  BILITOT 0.8 0.9 0.9  PROT 4.8* 4.9* 4.1*  ALBUMIN 2.4* 2.3* 2.1*    Recent Labs Lab 09/06/15 1255  LIPASE 11*   No results for input(s): AMMONIA in the last 168 hours.  CBC:  Recent Labs Lab 09/06/15 1255 09/09/15 1125 09/10/15 0530  WBC 20.8* 19.3* 21.9*  NEUTROABS  --  16.0*  --   HGB 9.8* 9.9* 9.1*  HCT 30.5* 31.1* 28.5*  MCV 79.1* 80.0 79.3*  PLT 226 203 200    Cardiac Enzymes:  Recent Labs Lab 09/06/15 1255  TROPONINI <0.03    BNP: Invalid input(s): POCBNP  CBG:  Recent Labs Lab 09/04/15 0817  GLUCAP 90    Microbiology: Results for orders placed or  performed during the hospital encounter of 09/09/15  Urine culture     Status: None (Preliminary result)   Collection Time: 09/09/15  6:34 PM  Result Value Ref Range Status   Specimen Description URINE, CLEAN CATCH  Final   Special Requests NONE  Final   Culture NO GROWTH < 12 HOURS  Final   Report Status PENDING  Incomplete    Coagulation Studies: No results for input(s): LABPROT, INR in the last 72 hours.  Urinalysis:  Recent Labs  09/09/15 1834  COLORURINE YELLOW*  LABSPEC 1.004*  PHURINE 6.0  GLUCOSEU NEGATIVE  HGBUR NEGATIVE  BILIRUBINUR NEGATIVE  KETONESUR NEGATIVE  PROTEINUR NEGATIVE  NITRITE NEGATIVE  LEUKOCYTESUR TRACE*      Imaging: Ct Abdomen Pelvis Wo Contrast  09/09/2015   CLINICAL DATA:  Initial encounter for increasing left upper quadrant pain in a patient with history of lymphoma. 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  EXAM: CT ABDOMEN AND PELVIS WITHOUT CONTRAST  TECHNIQUE: Multidetector CT imaging of the abdomen and pelvis was performed following the standard protocol without IV contrast.  COMPARISON:  08/29/2015.  FINDINGS: Lower chest: Emphysema with chronic interstitial changes noted in the lung bases. A posterior focus of collapse/consolidation is seen in the left lower lobe. 2.1 cm nodular pleural-based lesion is seen at the dome of the left hemidiaphragm.  Hepatobiliary: Nodularity along the anterior liver margin raises the question of cirrhosis. There is a new 2.2 cm low-density lesion in the lateral segment left liver (image 13 series 2). A new 2.4 cm lesion is seen in the inferior right liver (image 30). These are suspicious for metastatic disease given the rapid interval appearance. There is no evidence for gallstones, gallbladder wall thickening, or pericholecystic fluid. No intrahepatic or extrahepatic biliary dilation.  Pancreas: No focal mass lesion. No dilatation of the main duct. No intraparenchymal  cyst. No peripancreatic edema.  Spleen: No splenomegaly. No focal mass lesion.  Adrenals/Urinary Tract: Right adrenal gland is unremarkable. Stable 11 mm myelolipoma of the  left adrenal gland. Kidneys are unremarkable. No evidence for hydroureter. The urinary bladder appears normal for the degree of distention.  Stomach/Bowel: Tiny hiatal hernia noted. Stomach otherwise unremarkable. No small bowel wall thickening. No small bowel dilatation. The terminal ileum is normal. The appendix is normal. Large lesion involving the splenic flexure of the colon again noted. No evidence for obstruction.  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 Small nodules are seen along the margin of the large splenic flexure lesion with a conglomeration of lymph nodes identified in the central small bowel mesentery measuring 2.9 x 3.4 cm.  Reproductive: Prostate gland is mildly enlarged.  Other: Small volume intraperitoneal free fluid is evident.  Musculoskeletal: Bone windows reveal no worrisome lytic or sclerotic osseous lesions.  IMPRESSION: 1. No substantial change in the large necrotic/perforated splenic flexure mass with adjacent nodularity and central mesenteric lymphadenopathy. 2. Interval development of hypo attenuating lesions in the liver parenchyma, suspicious for metastatic disease. 3. Interval development of small volume intraperitoneal ascites 4. 3.9 cm abdominal aortic aneurysm   Electronically Signed   By: Misty Stanley M.D.   On: 09/09/2015 16:39      Assessment & Plan: Craig Page is a 69 y.o.white male with coronary artery disease, congestive heart failure, hypertension, hyperlipidemia, follicular lymphoma who was admitted to [facility] on 09/09/2015 for acute renal failure.  Colonic obstruction   1. Acute Renal Failure: no obstruction on CT. However seems to be due to acute ATN. Urine output acceptable. Other etiology does include lymphoma induced membranous glomerulonephritis.  -  Continue IV fluids, monitor serum electrolytes and urine output. No acute indication for dialysis at this time.  - Low threshold for temporary dialysis.   2. Colonic Obstruction: patient is surgically cleared from a Nephrology standpoint. Have discussed case with Dr. Jamal Collin.  - status post antibiotics.   3. Anemia: with persistent leukocytosis. Anemia is long standing and stable. Management as per oncology.   4. Chronic Systolic Congestive Heart Failure: not acute exacerbation. Monitor volume status. EF 35-40% on echo from 10/2014.      LOS: 1 Julliette Frentz 10/4/20161:22 PM

## 2015-09-10 NOTE — Anesthesia Procedure Notes (Signed)
Procedure Name: Intubation Performed by: Rolla Plate Pre-anesthesia Checklist: Patient identified, Patient being monitored, Timeout performed, Emergency Drugs available and Suction available Patient Re-evaluated:Patient Re-evaluated prior to inductionOxygen Delivery Method: Circle system utilized Preoxygenation: Pre-oxygenation with 100% oxygen Intubation Type: IV induction and Rapid sequence Laryngoscope Size: Miller and 2 Grade View: Grade I Tube type: Oral Tube size: 7.5 mm Number of attempts: 1 Airway Equipment and Method: Stylet Placement Confirmation: ETT inserted through vocal cords under direct vision,  positive ETCO2 and breath sounds checked- equal and bilateral Secured at: 22 cm Tube secured with: Tape Dental Injury: Teeth and Oropharynx as per pre-operative assessment

## 2015-09-10 NOTE — Anesthesia Preprocedure Evaluation (Addendum)
Anesthesia Evaluation  Patient identified by MRN, date of birth, ID band Patient awake    Reviewed: Allergy & Precautions, H&P , NPO status , Patient's Chart, lab work & pertinent test results  History of Anesthesia Complications Negative for: history of anesthetic complications  Airway Mallampati: III  TM Distance: >3 FB Neck ROM: limited    Dental  (+) Upper Dentures, Lower Dentures   Pulmonary neg shortness of breath, former smoker,    Pulmonary exam normal breath sounds clear to auscultation       Cardiovascular Exercise Tolerance: Good hypertension, + CAD, + Past MI, + Peripheral Vascular Disease and +CHF  Normal cardiovascular exam Rhythm:regular Rate:Normal     Neuro/Psych negative neurological ROS  negative psych ROS   GI/Hepatic negative GI ROS, Neg liver ROS,   Endo/Other  negative endocrine ROS  Renal/GU negative Renal ROS  negative genitourinary   Musculoskeletal   Abdominal   Peds  Hematology negative hematology ROS (+)   Anesthesia Other Findings Past Medical History:   CAD (coronary artery disease)                                  Comment:3v   Ischemic cardiomyopathy                                      Chronic systolic heart failure (HCC)                         HTN (hypertension)                                           HLD (hyperlipidemia)                                         Follicular lymphoma (HCC)                                    Myocardial infarction ([place])                     1992           Comment:treated with thrombolytics/notes 10/16/2014   Reproductive/Obstetrics negative OB ROS                            Anesthesia Physical Anesthesia Plan  ASA: IV  Anesthesia Plan: General ETT   Post-op Pain Management:    Induction:   Airway Management Planned:   Additional Equipment:   Intra-op Plan:   Post-operative Plan:   Informed Consent: I  have reviewed the patients History and Physical, chart, labs and discussed the procedure including the risks, benefits and alternatives for the proposed anesthesia with the patient or authorized representative who has indicated his/her understanding and acceptance.   Dental Advisory Given  Plan Discussed with: Anesthesiologist, CRNA and Surgeon  Anesthesia Plan Comments: (Patient informed that they are higher risk for complications from anesthesia during this procedure due to their medical history.  Patient voiced understanding.)  Anesthesia Quick Evaluation  

## 2015-09-11 ENCOUNTER — Ambulatory Visit: Payer: Self-pay | Admitting: General Surgery

## 2015-09-11 ENCOUNTER — Encounter: Payer: Self-pay | Admitting: General Surgery

## 2015-09-11 DIAGNOSIS — C786 Secondary malignant neoplasm of retroperitoneum and peritoneum: Secondary | ICD-10-CM

## 2015-09-11 DIAGNOSIS — K6389 Other specified diseases of intestine: Secondary | ICD-10-CM | POA: Insufficient documentation

## 2015-09-11 DIAGNOSIS — Z515 Encounter for palliative care: Secondary | ICD-10-CM

## 2015-09-11 DIAGNOSIS — Z8572 Personal history of non-Hodgkin lymphomas: Secondary | ICD-10-CM

## 2015-09-11 LAB — CBC WITH DIFFERENTIAL/PLATELET
BASOS ABS: 0.1 10*3/uL (ref 0–0.1)
BASOS PCT: 0 %
Basophils Absolute: 0.1 10*3/uL (ref 0–0.1)
Basophils Relative: 0 %
EOS ABS: 0.7 10*3/uL (ref 0–0.7)
EOS ABS: 0.9 10*3/uL — AB (ref 0–0.7)
EOS PCT: 4 %
Eosinophils Relative: 4 %
HCT: 25.9 % — ABNORMAL LOW (ref 40.0–52.0)
HCT: 26.9 % — ABNORMAL LOW (ref 40.0–52.0)
HEMOGLOBIN: 8.3 g/dL — AB (ref 13.0–18.0)
Hemoglobin: 8.8 g/dL — ABNORMAL LOW (ref 13.0–18.0)
LYMPHS ABS: 1.2 10*3/uL (ref 1.0–3.6)
LYMPHS PCT: 4 %
Lymphocytes Relative: 7 %
Lymphs Abs: 1 10*3/uL (ref 1.0–3.6)
MCH: 25.5 pg — AB (ref 26.0–34.0)
MCH: 26.3 pg (ref 26.0–34.0)
MCHC: 31.9 g/dL — AB (ref 32.0–36.0)
MCHC: 32.6 g/dL (ref 32.0–36.0)
MCV: 79.9 fL — ABNORMAL LOW (ref 80.0–100.0)
MCV: 80.5 fL (ref 80.0–100.0)
MONO ABS: 1.1 10*3/uL — AB (ref 0.2–1.0)
MONO ABS: 1.2 10*3/uL — AB (ref 0.2–1.0)
MONOS PCT: 6 %
Monocytes Relative: 5 %
NEUTROS PCT: 83 %
Neutro Abs: 15.4 10*3/uL — ABNORMAL HIGH (ref 1.4–6.5)
Neutro Abs: 19.7 10*3/uL — ABNORMAL HIGH (ref 1.4–6.5)
Neutrophils Relative %: 87 %
PLATELETS: 196 10*3/uL (ref 150–440)
Platelets: 193 10*3/uL (ref 150–440)
RBC: 3.24 MIL/uL — ABNORMAL LOW (ref 4.40–5.90)
RBC: 3.35 MIL/uL — AB (ref 4.40–5.90)
RDW: 23.1 % — AB (ref 11.5–14.5)
RDW: 23.4 % — AB (ref 11.5–14.5)
WBC: 18.5 10*3/uL — ABNORMAL HIGH (ref 3.8–10.6)
WBC: 22.9 10*3/uL — AB (ref 3.8–10.6)

## 2015-09-11 LAB — COMPREHENSIVE METABOLIC PANEL
ALBUMIN: 1.9 g/dL — AB (ref 3.5–5.0)
ALK PHOS: 147 U/L — AB (ref 38–126)
ALT: 6 U/L — ABNORMAL LOW (ref 17–63)
ANION GAP: 8 (ref 5–15)
AST: 11 U/L — ABNORMAL LOW (ref 15–41)
BILIRUBIN TOTAL: 0.8 mg/dL (ref 0.3–1.2)
BUN: 41 mg/dL — ABNORMAL HIGH (ref 6–20)
CALCIUM: 7.3 mg/dL — AB (ref 8.9–10.3)
CO2: 19 mmol/L — ABNORMAL LOW (ref 22–32)
Chloride: 110 mmol/L (ref 101–111)
Creatinine, Ser: 5.03 mg/dL — ABNORMAL HIGH (ref 0.61–1.24)
GFR calc non Af Amer: 11 mL/min — ABNORMAL LOW (ref >60)
GFR, EST AFRICAN AMERICAN: 12 mL/min — AB (ref >60)
Glucose, Bld: 96 mg/dL (ref 65–99)
POTASSIUM: 4.8 mmol/L (ref 3.5–5.1)
SODIUM: 137 mmol/L (ref 135–145)
TOTAL PROTEIN: 3.8 g/dL — AB (ref 6.5–8.1)

## 2015-09-11 LAB — URINE CULTURE: CULTURE: NO GROWTH

## 2015-09-11 MED ORDER — HEPARIN SODIUM (PORCINE) 5000 UNIT/ML IJ SOLN
5000.0000 [IU] | Freq: Two times a day (BID) | INTRAMUSCULAR | Status: DC
  Administered 2015-09-11 – 2015-09-12 (×3): 5000 [IU] via SUBCUTANEOUS
  Filled 2015-09-11 (×3): qty 1

## 2015-09-11 NOTE — Plan of Care (Signed)
Problem: Discharge Progression Outcomes Goal: Other Discharge Outcomes/Goals Outcome: Progressing Plan of care progress to goals: 1. C/o of moderate pain relieved by PRN Oxycodone  2. Hemodynamically:             -VSS (BP remains low), afebrile, IVF infusing as ordered              -foley remains in place for critical I/O (Creatinine 4.91, BUN 32)              -RUQ colostomy: no output, small amount of blood at site  3. Clear liquid diet, no appetite since returning from surgery. Only sips of drink, no N/V noted.  4. High fall risk. Bed alarm on, hourly rounding. Pt understands how to use call system for assistance.

## 2015-09-11 NOTE — Progress Notes (Signed)
ANTIBIOTIC CONSULT NOTE - Follow up  Pharmacy Consult for Zosyn Indication: intra-abdominal infection  No Known Allergies  Patient Measurements: Height: 5\' 11"  (180.3 cm) Weight: 165 lb 1.6 oz (74.889 kg) IBW/kg (Calculated) : 75.3   Vital Signs: Temp: 97.6 F (36.4 C) (10/05 0454) Temp Source: Oral (10/05 0454) BP: 100/56 mmHg (10/05 0454) Pulse Rate: 79 (10/05 0454)   Labs:  Recent Labs  09/09/15 1125 09/10/15 0530 09/11/15 0433  WBC 19.3* 21.9* 18.5*  HGB 9.9* 9.1* 8.3*  PLT 203 200 193  CREATININE 4.88* 4.91* 5.03*   Estimated Creatinine Clearance: 14.9 mL/min (by C-G formula based on Cr of 5.03). No results for input(s): VANCOTROUGH, VANCOPEAK, VANCORANDOM, GENTTROUGH, GENTPEAK, GENTRANDOM, TOBRATROUGH, TOBRAPEAK, TOBRARND, AMIKACINPEAK, AMIKACINTROU, AMIKACIN in the last 72 hours.   Microbiology: Recent Results (from the past 720 hour(s))  Culture, blood (routine x 2)     Status: None (Preliminary result)   Collection Time: 09/09/15  2:59 PM  Result Value Ref Range Status   Specimen Description BLOOD RIGHT ASSIST CONTROL  Final   Special Requests AER 10ML ANA 10ML  Final   Culture NO GROWTH 2 DAYS  Final   Report Status PENDING  Incomplete  Culture, blood (routine x 2)     Status: None (Preliminary result)   Collection Time: 09/09/15  3:38 PM  Result Value Ref Range Status   Specimen Description BLOOD RIGHT ARM  Final   Special Requests AER 10ML ANA 10ML  Final   Culture NO GROWTH 2 DAYS  Final   Report Status PENDING  Incomplete  Urine culture     Status: None (Preliminary result)   Collection Time: 09/09/15  6:34 PM  Result Value Ref Range Status   Specimen Description URINE, CLEAN CATCH  Final   Special Requests NONE  Final   Culture NO GROWTH < 12 HOURS  Final   Report Status PENDING  Incomplete    Assessment: Pharmacy consulted to dose zosyn for intra-abdominal infection in this 69 year old male. AKI with SCr of 5.03 (up from ~1.0 in September),  estCrCl: 14.9 ml/min  Plan:  Will continue zosyn 3.375gm IV Q12H as indicated for CrCl < 58ml/min.  Pharmacy to follow per consult.    Rayna Sexton, PharmD, BCPS Clinical Pharmacist 09/11/2015 10:42 AM

## 2015-09-11 NOTE — Progress Notes (Signed)
   SUBJECTIVE: Pt lying in bed, c/o abdominal pain when he moves.    Filed Vitals:   09/10/15 2115 09/11/15 0152 09/11/15 0154 09/11/15 0454  BP:  96/64  100/56  Pulse: 81 72 78 79  Temp:  98.1 F (36.7 C)  97.6 F (36.4 C)  TempSrc:  Oral  Oral  Resp:  18 18 18   Height:      Weight:      SpO2: 97% 95% 95% 94%    Intake/Output Summary (Last 24 hours) at 09/11/15 1035 Last data filed at 09/11/15 0500  Gross per 24 hour  Intake    900 ml  Output    525 ml  Net    375 ml    LABS: Basic Metabolic Panel:  Recent Labs  09/10/15 0530 09/11/15 0433  NA 134* 137  K 4.5 4.8  CL 110 110  CO2 19* 19*  GLUCOSE 105* 96  BUN 32* 41*  CREATININE 4.91* 5.03*  CALCIUM 7.0* 7.3*  MG 1.6*  --    Liver Function Tests:  Recent Labs  09/10/15 0530 09/11/15 0433  AST 12* 11*  ALT 7* 6*  ALKPHOS 191* 147*  BILITOT 0.9 0.8  PROT 4.1* 3.8*  ALBUMIN 2.1* 1.9*   No results for input(s): LIPASE, AMYLASE in the last 72 hours. CBC:  Recent Labs  09/09/15 1125 09/10/15 0530 09/11/15 0433  WBC 19.3* 21.9* 18.5*  NEUTROABS 16.0*  --  15.4*  HGB 9.9* 9.1* 8.3*  HCT 31.1* 28.5* 25.9*  MCV 80.0 79.3* 79.9*  PLT 203 200 193   Cardiac Enzymes: No results for input(s): CKTOTAL, CKMB, CKMBINDEX, TROPONINI in the last 72 hours. BNP: Invalid input(s): POCBNP D-Dimer: No results for input(s): DDIMER in the last 72 hours. Hemoglobin A1C: No results for input(s): HGBA1C in the last 72 hours. Fasting Lipid Panel: No results for input(s): CHOL, HDL, LDLCALC, TRIG, CHOLHDL, LDLDIRECT in the last 72 hours. Thyroid Function Tests: No results for input(s): TSH, T4TOTAL, T3FREE, THYROIDAB in the last 72 hours.  Invalid input(s): FREET3 Anemia Panel: No results for input(s): VITAMINB12, FOLATE, FERRITIN, TIBC, IRON, RETICCTPCT in the last 72 hours.   PHYSICAL EXAM General: Well developed, well nourished, in no acute distress HEENT:  Normocephalic and atramatic Neck:  No JVD.   Lungs: Clear bilaterally to auscultation and percussion. Heart: HRRR . Normal S1 and S2 without gallops or murmurs.  Abdomen: RUQ colostomy present Msk:  Back normal, normal gait. Normal strength and tone for age. Extremities: No clubbing, cyanosis or edema.   Neuro: Alert and oriented X 3. Psych:  Good affect, responds appropriately  TELEMETRY: Reviewed telemetry pt in NSR  ASSESSMENT AND PLAN: Continue cardiac meds as BP tolerates.    Patient and plan discussed with supervising provider, Dr. Neoma Laming, who agrees with above findings.   Kelby Fam [place], [place]  09/11/2015 10:35 AM

## 2015-09-11 NOTE — Progress Notes (Signed)
Patient ID: Craig Page, male   DOB: 06/18/1946, 69 y.o.   MRN: [id number] Pt is alert, c/o no appetite. VSS, afebrile. Abdomen is soft, tender luq, good bowel sounds. Colostomy intact and pink. Some old blodd and gas output. Creat remains high but he is now making good amount of urine. Pt advised of operative findings.

## 2015-09-11 NOTE — Progress Notes (Signed)
Spoke with Dr. Jamal Collin over the phone reported dark red blood noted in colostomy bag.  Dr. Jamal Collin aware and reports coming from surgery will continue to closely monitor.  Stoma beefy red, bag intact, Patient tender to touch and sore from surgery, no other complaints.  Some abdominal distention with positive bowel sounds noted.

## 2015-09-11 NOTE — Progress Notes (Signed)
[facility] NAME: Craig Page    MR#:  [id number]  DATE OF BIRTH:  15-Aug-1946  SUBJECTIVE:  CHIEF COMPLAINT:  No chief complaint on file.  s/p surgery 09/10/15. Have some pain on trying to move. Tolerated diet.  REVIEW OF SYSTEMS:  CONSTITUTIONAL: No fever, fatigue or weakness.  EYES: No blurred or double vision.  EARS, NOSE, AND THROAT: No tinnitus or ear pain.  RESPIRATORY: No cough, shortness of breath, wheezing or hemoptysis.  CARDIOVASCULAR: No chest pain, orthopnea, edema.  GASTROINTESTINAL: No nausea, vomiting, diarrhea , but have abdominal pain.  GENITOURINARY: No dysuria, hematuria.  ENDOCRINE: No polyuria, nocturia,  HEMATOLOGY: No anemia, easy bruising or bleeding SKIN: No rash or lesion. MUSCULOSKELETAL: No joint pain or arthritis.   NEUROLOGIC: No tingling, numbness, weakness.  PSYCHIATRY: No anxiety or depression.   ROS  DRUG ALLERGIES:  No Known Allergies  VITALS:  Blood pressure 101/58, pulse 77, temperature 98 F (36.7 C), temperature source Oral, resp. rate 18, height 5\' 11"  (1.803 m), weight 74.889 kg (165 lb 1.6 oz), SpO2 93 %.  PHYSICAL EXAMINATION:   GENERAL: Well-nourished, well-developed , currently in no acute distress.  HEAD: Normocephalic, atraumatic.  EYES: Pupils equal, round, and reactive to light. Extraocular muscles intact. No scleral icterus.  MOUTH: Moist mucosal membranes. Dentition intact. No abscess noted. EARS, NOSE, THROAT: Clear without exudates. No external lesions.  NECK: Supple. No thyromegaly. No nodules. No JVD.  PULMONARY: Clear to auscultation bilaterally without wheezes, rales, or rhonchi. No use of accessory muscles. Good respiratory effort. CHEST: Nontender to palpation.  CARDIOVASCULAR: S1, S2, regular rate and rhythm. No murmurs, rubs, or gallops.  GASTROINTESTINAL: Soft, diffusely tender, surgical dressing present. MUSCULOSKELETAL: No swelling, clubbing,  edema. Range of motion full in all extremities. NEUROLOGIC: Cranial nerves II-XII intact. No gross focal neurological deficits. Sensation intact. Reflexes intact. SKIN: No ulcerations, lesions, rash, cyanosis. Skin warm, dry. Turgor intact. PSYCHIATRIC: Mood, affect within normal limits. Patient awake, alert, oriented x 3. Insight and judgment intact.  Physical Exam LABORATORY PANEL:   CBC  Recent Labs Lab 09/11/15 1230  WBC 22.9*  HGB 8.8*  HCT 26.9*  PLT 196   ------------------------------------------------------------------------------------------------------------------  Chemistries   Recent Labs Lab 09/10/15 0530 09/11/15 0433  NA 134* 137  K 4.5 4.8  CL 110 110  CO2 19* 19*  GLUCOSE 105* 96  BUN 32* 41*  CREATININE 4.91* 5.03*  CALCIUM 7.0* 7.3*  MG 1.6*  --   AST 12* 11*  ALT 7* 6*  ALKPHOS 191* 147*  BILITOT 0.9 0.8   ------------------------------------------------------------------------------------------------------------------  Cardiac Enzymes  Recent Labs Lab 09/06/15 1255  TROPONINI <0.03   ------------------------------------------------------------------------------------------------------------------  RADIOLOGY:  No results found.  ASSESSMENT AND PLAN:   Active Problems:   Colonic obstruction (HCC)   Peritoneal carcinomatosis ([place])   Colonic mass  1 coronary artery disease with ischemic cardiomyopathy Patient is asymptomatic Will continue Coreg with holding parameters  Cardiology on case.  2. Chronic systolic congestive heart failure-currently not fluid overloaded Hold Lasix and spironolactone in view of hypotension Monitor closely for symptoms and signs of fluid overload  3. Hypertension, currently patient is hypotensive Continue his blood pressure medications Coreg and amiodarone with holding parameters Discontinue lisinopril as the patient is currently with low blood pressure  4. History of follicular lymphoma with the  splenic flexure perforation postop day #1 status post exploratory laparotomy and colon resection Continue IV fluids Pain management and postop care per  surgery Continue IV antibiotic Zosyn and check a.m. Labs  5. Acute renal failure probably secondary to ATN Avoid nephrotoxins, provide IV fluids Follow-up with nephrology  6.Tobacco abuse disorder Consultation to quit smoking for 3-5 minutes. Patient is considering to quit smoking. provide nicotine patch  All the records are reviewed and case discussed with Care Management/Social Workerr. Management plans discussed with the patient, family and they are in agreement.  CODE STATUS: full  TOTAL TIME TAKING CARE OF THIS PATIENT: 35 minutes.     POSSIBLE D/C IN 2-3 DAYS, DEPENDING ON CLINICAL CONDITION.   [name] M.D on 09/11/2015   Between 7am to 6pm - Pager - [phone]  After 6pm go to [email or web address] - password [email or web address] [facility]  Office  [phone]  CC: Primary care physician; [name], MD  Note: This dictation was prepared with Dragon dictation along with smaller phrase technology. Any transcriptional errors that result from this process are unintentional.

## 2015-09-11 NOTE — Progress Notes (Signed)
So was admitted in the hospital to see detail history and physical from the hospital note  [place] @ [facility] [phone]  [phone]) [place] OB: 1946-03-13  MR#: [id number]  LOV#:[id number]  Patient Care Team: Arlis Porta., MD as PCP - General (Family Medicine)  CHIEF COMPLAINT:   1 acute renal failure 2.  Perforated colon with contained perforation Possibility of primary colon cancer with metastases to the lymph node and liver metastases versus lymphoma recurrent disease 3.  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  Previous history of coronary artery disease being managed by cardiologist  Oncology Flowsheet 10/17/2014 10/18/2014 10/19/2014 09/10/2015  diazepam (VALIUM) PO - - 5 mg -  enoxaparin (LOVENOX) [place] 40 mg 40 mg - -  ondansetron (ZOFRAN) IV - - - -    INTERVAL HISTORY: Patient was admitted in hospital with progressive renal failure feeling week tired increasing abdominal pain.  Over last 24 hours intravenous fluid patient has developed more ascites most swelling of lower extremity with progressive increase in serum creatinine. Patient had undergone colostomy and surgery yet last night.  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  Final pathology diagnosis still not available. The patient is not in pain.  REVIEW OF SYSTEMS:    general status: Patient is feeling weak and tired.  No change in a performance status.  No chills.  No fever. HEENT:   No evidence of stomatitis Lungs: No cough or shortness of breath Cardiac: No chest pain or paroxysmal nocturnal dyspnea GI: Increasing abdominal distention. Increasing abdominal pain Skin: No rash Lower extremity swelling of lower extremity Neurological system: No tingling.  No numbness.  No other focal signs Musculoskeletal system no bony pains  As per HPI. Otherwise, a complete review of systems is negatve.  PAST MEDICAL HISTORY: Past  Medical History  Diagnosis Date  . CAD (coronary artery disease)     3v  . Ischemic cardiomyopathy   . Chronic systolic heart failure ([place])   . HTN (hypertension)   . HLD (hyperlipidemia)   . Follicular lymphoma ([place])   . Myocardial infarction [facility]) 1992    treated with thrombolytics/notes 10/16/2014    PAST SURGICAL HISTORY: Past Surgical History  Procedure Laterality Date  . Hernia repair    . Umbilical hernia repair  1964  . Coronary artery bypass graft N/A 10/19/2014    Procedure: CORONARY ARTERY BYPASS GRAFTING (CABG);  Surgeon: Ivin Poot, MD;  Location: [place];  Service: Open Heart Surgery;  Laterality: N/A;  Times 3 using left internal mammary artery and endoscopically harvested left saphenous vein  . Intraoperative transesophageal echocardiogram N/A 10/19/2014    Procedure: INTRAOPERATIVE TRANSESOPHAGEAL ECHOCARDIOGRAM;  Surgeon: Ivin Poot, MD;  Location: [place];  Service: Open Heart Surgery;  Laterality: N/A;  . Laparoscopy N/A 09/10/2015    Procedure: LAPAROSCOPY DIAGNOSTIC;  Surgeon: Christene Lye, MD;  Location: [facility];  Service: General;  Laterality: N/A;  . Laparotomy N/A 09/10/2015    Procedure: EXPLORATORY LAPAROTOMY;  Surgeon: Christene Lye, MD;  Location: [facility];  Service: General;  Laterality: N/A;  . Transverse loop colostomy Right 09/10/2015    Procedure: TRANSVERSE LOOP COLOSTOMY;  Surgeon: Christene Lye, MD;  Location: [facility];  Service: General;  Laterality: Right;    FAMILY HISTORY Family History  Problem Relation Age of Onset  . Heart failure Mother     deceased  . Cirrhosis Father  deceased    ADVANCED DIRECTIVES:  No flowsheet data found.  HEALTH MAINTENANCE: Social History  Substance Use Topics  . Smoking status: Former Smoker -- 1.00 packs/day for 50 years    Types: Cigarettes    Quit date: 09/24/2014  . Smokeless tobacco: Never Used  . Alcohol Use: No      No Known Allergies  Current  Facility-Administered Medications  Medication Dose Route Frequency Provider Last Rate Last Dose  . 0.9 %  sodium chloride infusion   Intravenous Continuous Lavonia Dana, MD 75 mL/hr at 09/11/15 1156 75 mL/hr at 09/11/15 1156  . acetaminophen (TYLENOL) tablet 650 mg  650 mg Oral Q6H PRN Seeplaputhur Robinette Haines, MD       Or  . acetaminophen (TYLENOL) suppository 650 mg  650 mg Rectal Q6H PRN Seeplaputhur Robinette Haines, MD      . amiodarone (PACERONE) tablet 200 mg  200 mg Oral Daily Seeplaputhur Robinette Haines, MD   200 mg at 09/10/15 1948  . heparin injection 5,000 Units  5,000 Units Subcutaneous Q12H Seeplaputhur Robinette Haines, MD      . morphine 2 MG/ML injection 2 mg  2 mg Intravenous Q3H PRN Christene Lye, MD   2 mg at 09/11/15 0416  . ondansetron (ZOFRAN) 4 mg in sodium chloride 0.9 % 50 mL IVPB   Intravenous Q4H PRN Forest Gleason, MD      . ondansetron (ZOFRAN) tablet 4-8 mg  4-8 mg Oral Q8H PRN Seeplaputhur Robinette Haines, MD      . oxyCODONE (Oxy IR/ROXICODONE) immediate release tablet 5-10 mg  5-10 mg Oral Q4H PRN Christene Lye, MD   5 mg at 09/11/15 1341  . pantoprazole (PROTONIX) EC tablet 40 mg  40 mg Oral Daily Seeplaputhur Robinette Haines, MD   40 mg at 09/11/15 1157  . piperacillin-tazobactam (ZOSYN) IVPB 3.375 g  3.375 g Intravenous Q12H Forest Gleason, MD   3.375 g at 09/11/15 1156  . simethicone (MYLICON) chewable tablet 80 mg  80 mg Oral TID PRN Seeplaputhur Robinette Haines, MD        OBJECTIVE:  Filed Vitals:   09/11/15 1721  BP: 101/63  Pulse: 89  Temp: 98.1 F (36.7 C)  Resp:      Body mass index is 23.04 kg/(m^2).    ECOG FS:2 - Symptomatic, <50% confined to bed  PHYSICAL EXAM: Patient is lying in the bed not very uncomfortable. Performance status is 2 Lungs occasional crepitations.  Cardiac: Irregular heart sounds soft systolic murmur.  Abdomen: Swollen.  Ascites.  Palpable mass in the left upper abdominal area.  Also the present.  Lower extremity edema. Neurological system no  localizing sign Skin: No rash Lymphatic system: Supraclavicular, cervical, axillary, inguinal lymph nodes are not palpable Head exam was generally normal. There was no scleral icterus or corneal arcus. Mucous membranes were moist. Colostomy has blurred at this point in time.  But patient is not acutely bleeding   LAB RESULTS:  CBC Latest Ref Rng 09/11/2015 09/11/2015  WBC 3.8 - 10.6 K/uL 22.9(H) 18.5(H)  Hemoglobin 13.0 - 18.0 g/dL 8.8(L) 8.3(L)  Hematocrit 40.0 - 52.0 % 26.9(L) 25.9(L)  Platelets 150 - 440 K/uL 196 193    Admission on 09/09/2015  Component Date Value Ref Range Status  . Specimen Description 09/09/2015 URINE, CLEAN CATCH   Final  . Special Requests 09/09/2015 NONE   Final  . Culture 09/09/2015 NO GROWTH 1 DAY   Final  . Report Status 09/09/2015 09/11/2015 FINAL  Final  . Specimen Description 09/09/2015 BLOOD RIGHT ARM   Final  . Special Requests 09/09/2015 AER 10ML ANA 10ML   Final  . Culture 09/09/2015 NO GROWTH 2 DAYS   Final  . Report Status 09/09/2015 PENDING   Incomplete  . Specimen Description 09/09/2015 BLOOD RIGHT ASSIST CONTROL   Final  . Special Requests 09/09/2015 AER 10ML ANA 10ML   Final  . Culture 09/09/2015 NO GROWTH 2 DAYS   Final  . Report Status 09/09/2015 PENDING   Incomplete  . Color, Urine 09/09/2015 YELLOW* YELLOW Final  . APPearance 09/09/2015 CLEAR* CLEAR Final  . Glucose, UA 09/09/2015 NEGATIVE  NEGATIVE mg/dL Final  . Bilirubin Urine 09/09/2015 NEGATIVE  NEGATIVE Final  . Ketones, ur 09/09/2015 NEGATIVE  NEGATIVE mg/dL Final  . Specific Gravity, Urine 09/09/2015 1.004* 1.005 - 1.030 Final  . Hgb urine dipstick 09/09/2015 NEGATIVE  NEGATIVE Final  . pH 09/09/2015 6.0  5.0 - 8.0 Final  . Protein, ur 09/09/2015 NEGATIVE  NEGATIVE mg/dL Final  . Nitrite 09/09/2015 NEGATIVE  NEGATIVE Final  . Leukocytes, UA 09/09/2015 TRACE* NEGATIVE Final  . RBC / HPF 09/09/2015 0-5  0 - 5 RBC/hpf Final  . WBC, UA 09/09/2015 0-5  0 - 5 WBC/hpf Final  .  Bacteria, UA 09/09/2015 NONE SEEN  NONE SEEN Final  . Squamous Epithelial / LPF 09/09/2015 NONE SEEN  NONE SEEN Final  . WBC 09/10/2015 21.9* 3.8 - 10.6 K/uL Final  . RBC 09/10/2015 3.59* 4.40 - 5.90 MIL/uL Final  . Hemoglobin 09/10/2015 9.1* 13.0 - 18.0 g/dL Final  . HCT 09/10/2015 28.5* 40.0 - 52.0 % Final  . MCV 09/10/2015 79.3* 80.0 - 100.0 fL Final  . MCH 09/10/2015 25.4* 26.0 - 34.0 pg Final  . MCHC 09/10/2015 32.1  32.0 - 36.0 g/dL Final  . RDW 09/10/2015 22.8* 11.5 - 14.5 % Final  . Platelets 09/10/2015 200  150 - 440 K/uL Final  . Sodium 09/10/2015 134* 135 - 145 mmol/L Final  . Potassium 09/10/2015 4.5  3.5 - 5.1 mmol/L Final  . Chloride 09/10/2015 110  101 - 111 mmol/L Final  . CO2 09/10/2015 19* 22 - 32 mmol/L Final  . Glucose, Bld 09/10/2015 105* 65 - 99 mg/dL Final  . BUN 09/10/2015 32* 6 - 20 mg/dL Final  . Creatinine, Ser 09/10/2015 4.91* 0.61 - 1.24 mg/dL Final  . Calcium 09/10/2015 7.0* 8.9 - 10.3 mg/dL Final  . Total Protein 09/10/2015 4.1* 6.5 - 8.1 g/dL Final  . Albumin 09/10/2015 2.1* 3.5 - 5.0 g/dL Final  . AST 09/10/2015 12* 15 - 41 U/L Final  . ALT 09/10/2015 7* 17 - 63 U/L Final  . Alkaline Phosphatase 09/10/2015 191* 38 - 126 U/L Final  . Total Bilirubin 09/10/2015 0.9  0.3 - 1.2 mg/dL Final  . GFR calc non Af Amer 09/10/2015 11* >60 mL/min Final  . GFR calc Af Amer 09/10/2015 13* >60 mL/min Final   Comment: (NOTE) The eGFR has been calculated using the CKD EPI equation. This calculation has not been validated in all clinical situations. eGFR's persistently <60 mL/min signify possible Chronic Kidney Disease.   . Anion gap 09/10/2015 5  5 - 15 Final  . Magnesium 09/10/2015 1.6* 1.7 - 2.4 mg/dL Final  . WBC 09/11/2015 18.5* 3.8 - 10.6 K/uL Final  . RBC 09/11/2015 3.24* 4.40 - 5.90 MIL/uL Final  . Hemoglobin 09/11/2015 8.3* 13.0 - 18.0 g/dL Final  . HCT 09/11/2015 25.9* 40.0 - 52.0 % Final  . MCV  09/11/2015 79.9* 80.0 - 100.0 fL Final  . MCH 09/11/2015  25.5* 26.0 - 34.0 pg Final  . MCHC 09/11/2015 31.9* 32.0 - 36.0 g/dL Final  . RDW 09/11/2015 23.1* 11.5 - 14.5 % Final  . Platelets 09/11/2015 193  150 - 440 K/uL Final  . Neutrophils Relative % 09/11/2015 83   Final  . Neutro Abs 09/11/2015 15.4* 1.4 - 6.5 K/uL Final  . Lymphocytes Relative 09/11/2015 7   Final  . Lymphs Abs 09/11/2015 1.2  1.0 - 3.6 K/uL Final  . Monocytes Relative 09/11/2015 6   Final  . Monocytes Absolute 09/11/2015 1.1* 0.2 - 1.0 K/uL Final  . Eosinophils Relative 09/11/2015 4   Final  . Eosinophils Absolute 09/11/2015 0.7  0 - 0.7 K/uL Final  . Basophils Relative 09/11/2015 0   Final  . Basophils Absolute 09/11/2015 0.1  0 - 0.1 K/uL Final  . Sodium 09/11/2015 137  135 - 145 mmol/L Final  . Potassium 09/11/2015 4.8  3.5 - 5.1 mmol/L Final  . Chloride 09/11/2015 110  101 - 111 mmol/L Final  . CO2 09/11/2015 19* 22 - 32 mmol/L Final  . Glucose, Bld 09/11/2015 96  65 - 99 mg/dL Final  . BUN 09/11/2015 41* 6 - 20 mg/dL Final  . Creatinine, Ser 09/11/2015 5.03* 0.61 - 1.24 mg/dL Final  . Calcium 09/11/2015 7.3* 8.9 - 10.3 mg/dL Final  . Total Protein 09/11/2015 3.8* 6.5 - 8.1 g/dL Final  . Albumin 09/11/2015 1.9* 3.5 - 5.0 g/dL Final  . AST 09/11/2015 11* 15 - 41 U/L Final  . ALT 09/11/2015 6* 17 - 63 U/L Final  . Alkaline Phosphatase 09/11/2015 147* 38 - 126 U/L Final  . Total Bilirubin 09/11/2015 0.8  0.3 - 1.2 mg/dL Final  . GFR calc non Af Amer 09/11/2015 11* >60 mL/min Final  . GFR calc Af Amer 09/11/2015 12* >60 mL/min Final   Comment: (NOTE) The eGFR has been calculated using the CKD EPI equation. This calculation has not been validated in all clinical situations. eGFR's persistently <60 mL/min signify possible Chronic Kidney Disease.   . Anion gap 09/11/2015 8  5 - 15 Final  . WBC 09/11/2015 22.9* 3.8 - 10.6 K/uL Final  . RBC 09/11/2015 3.35* 4.40 - 5.90 MIL/uL Final  . Hemoglobin 09/11/2015 8.8* 13.0 - 18.0 g/dL Final  . HCT 09/11/2015 26.9* 40.0 -  52.0 % Final  . MCV 09/11/2015 80.5  80.0 - 100.0 fL Final  . MCH 09/11/2015 26.3  26.0 - 34.0 pg Final  . MCHC 09/11/2015 32.6  32.0 - 36.0 g/dL Final  . RDW 09/11/2015 23.4* 11.5 - 14.5 % Final  . Platelets 09/11/2015 196  150 - 440 K/uL Final  . Neutrophils Relative % 09/11/2015 87   Final  . Neutro Abs 09/11/2015 19.7* 1.4 - 6.5 K/uL Final  . Lymphocytes Relative 09/11/2015 4   Final  . Lymphs Abs 09/11/2015 1.0  1.0 - 3.6 K/uL Final  . Monocytes Relative 09/11/2015 5   Final  . Monocytes Absolute 09/11/2015 1.2* 0.2 - 1.0 K/uL Final  . Eosinophils Relative 09/11/2015 4   Final  . Eosinophils Absolute 09/11/2015 0.9* 0 - 0.7 K/uL Final  . Basophils Relative 09/11/2015 0   Final  . Basophils Absolute 09/11/2015 0.1  0 - 0.1 K/uL Final  Appointment on 09/09/2015  Component Date Value Ref Range Status  . WBC 09/09/2015 19.3* 3.8 - 10.6 K/uL Final  . RBC 09/09/2015 3.88* 4.40 - 5.90 MIL/uL Final  .  Hemoglobin 09/09/2015 9.9* 13.0 - 18.0 g/dL Final  . HCT 09/09/2015 31.1* 40.0 - 52.0 % Final  . MCV 09/09/2015 80.0  80.0 - 100.0 fL Final  . MCH 09/09/2015 25.6* 26.0 - 34.0 pg Final  . MCHC 09/09/2015 32.0  32.0 - 36.0 g/dL Final  . RDW 09/09/2015 23.2* 11.5 - 14.5 % Final  . Platelets 09/09/2015 203  150 - 440 K/uL Final  . Neutrophils Relative % 09/09/2015 83   Final  . Neutro Abs 09/09/2015 16.0* 1.4 - 6.5 K/uL Final  . Lymphocytes Relative 09/09/2015 7   Final  . Lymphs Abs 09/09/2015 1.3  1.0 - 3.6 K/uL Final  . Monocytes Relative 09/09/2015 7   Final  . Monocytes Absolute 09/09/2015 1.2* 0.2 - 1.0 K/uL Final  . Eosinophils Relative 09/09/2015 3   Final  . Eosinophils Absolute 09/09/2015 0.6  0 - 0.7 K/uL Final  . Basophils Relative 09/09/2015 0   Final  . Basophils Absolute 09/09/2015 0.1  0 - 0.1 K/uL Final  . Sodium 09/09/2015 130* 135 - 145 mmol/L Final  . Potassium 09/09/2015 3.8  3.5 - 5.1 mmol/L Final  . Chloride 09/09/2015 100* 101 - 111 mmol/L Final  . CO2  09/09/2015 19* 22 - 32 mmol/L Final  . Glucose, Bld 09/09/2015 159* 65 - 99 mg/dL Final  . BUN 09/09/2015 32* 6 - 20 mg/dL Final  . Creatinine, Ser 09/09/2015 4.88* 0.61 - 1.24 mg/dL Final  . Calcium 09/09/2015 7.0* 8.9 - 10.3 mg/dL Final  . Total Protein 09/09/2015 4.9* 6.5 - 8.1 g/dL Final  . Albumin 09/09/2015 2.3* 3.5 - 5.0 g/dL Final  . AST 09/09/2015 15  15 - 41 U/L Final  . ALT 09/09/2015 7* 17 - 63 U/L Final  . Alkaline Phosphatase 09/09/2015 187* 38 - 126 U/L Final  . Total Bilirubin 09/09/2015 0.9  0.3 - 1.2 mg/dL Final  . GFR calc non Af Amer 09/09/2015 11* >60 mL/min Final  . GFR calc Af Amer 09/09/2015 13* >60 mL/min Final   Comment: (NOTE) The eGFR has been calculated using the CKD EPI equation. This calculation has not been validated in all clinical situations. eGFR's persistently <60 mL/min signify possible Chronic Kidney Disease.   . Anion gap 09/09/2015 11  5 - 15 Final       STUDIES: Ct Abdomen Pelvis Wo Contrast  09/09/2015   CLINICAL DATA:  Initial encounter for increasing left upper quadrant pain in a patient with history of lymphoma. 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  EXAM: CT ABDOMEN AND PELVIS WITHOUT CONTRAST  TECHNIQUE: Multidetector CT imaging of the abdomen and pelvis was performed following the standard protocol without IV contrast.  COMPARISON:  08/29/2015.  FINDINGS: Lower chest: Emphysema with chronic interstitial changes noted in the lung bases. A posterior focus of collapse/consolidation is seen in the left lower lobe. 2.1 cm nodular pleural-based lesion is seen at the dome of the left hemidiaphragm.  Hepatobiliary: Nodularity along the anterior liver margin raises the question of cirrhosis. There is a new 2.2 cm low-density lesion in the lateral segment left liver (image 13 series 2). A new 2.4 cm lesion is seen in the inferior right liver (image 30). These are suspicious for metastatic disease given  the rapid interval appearance. There is no evidence for gallstones, gallbladder wall thickening, or pericholecystic fluid. No intrahepatic or extrahepatic biliary dilation.  Pancreas: No focal mass lesion. No dilatation of the main duct. No intraparenchymal cyst.  No peripancreatic edema.  Spleen: No splenomegaly. No focal mass lesion.  Adrenals/Urinary Tract: Right adrenal gland is unremarkable. 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 Kidneys are unremarkable. No evidence for hydroureter. The urinary bladder appears normal for the degree of distention.  Stomach/Bowel: Tiny hiatal hernia noted. Stomach otherwise unremarkable. No small bowel wall thickening. No small bowel dilatation. The terminal ileum is normal. The appendix is normal. Large lesion involving the splenic flexure of the colon again noted. No evidence for obstruction.  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 Small nodules are seen along the margin of the large splenic flexure lesion with a conglomeration of lymph nodes identified in the central small bowel mesentery measuring 2.9 x 3.4 cm.  Reproductive: Prostate gland is mildly enlarged.  Other: Small volume intraperitoneal free fluid is evident.  Musculoskeletal: Bone windows reveal no worrisome lytic or sclerotic osseous lesions.  IMPRESSION: 1. No substantial change in the large necrotic/perforated splenic flexure mass with adjacent nodularity and central mesenteric lymphadenopathy. 2. Interval development of hypo attenuating lesions in the liver parenchyma, suspicious for metastatic disease. 3. Interval development of small volume intraperitoneal ascites 4. 3.9 cm abdominal aortic aneurysm   Electronically Signed   By: Misty Stanley M.D.   On: 09/09/2015 16:39   Nm Pet Image Restag (ps) Skull Base To Thigh  08/29/2015   CLINICAL DATA:  Subsequent treatment strategy for lymphoma. Abdominal pain, diarrhea, leukocytosis, and splenomegaly.  EXAM:  NUCLEAR MEDICINE PET SKULL BASE TO THIGH  TECHNIQUE: 11.2 mCi F-18 FDG was injected intravenously. Full-ring PET imaging was performed from the skull base to thigh after the radiotracer. CT data was obtained and used for attenuation correction and anatomic localization.  FASTING BLOOD GLUCOSE:  Value: 92 mg/dl  COMPARISON:  Chest CT on 10/17/2014 and PET-CT on 02/12/2014  FINDINGS: NECK  No hypermetabolic lymph nodes in the neck.  CHEST  A 6 mm hypermetabolic mediastinal lymph node is seen just posterior to the lower thoracic esophagus on image 89 of series 3, which is hypermetabolic with SUV max of 7.1. This is new since previous study. No other hypermetabolic lymph nodes identified within the thorax. No suspicious pulmonary nodules seen on CT. Emphysema noted.  ABDOMEN/PELVIS  Small hypermetabolic soft tissue nodules are seen along the capsular surface of the liver and spleen. Largest is seen along the anterior aspect of the left hepatic lobe measuring 1.6 cm on image 121 of series 3, with SUV max of 8.2. There is also a tiny hypermetabolic focus in the left pelvic cul-de-sac which has an SUV max of 5.0 on image 63 of series 4. Trace amount of ascites is seen. These findings are highly suspicious with peritoneal carcinoma.  A 10.2 cm mass is seen in the left upper quadrant which shows a thick hypermetabolic rim and stool or other particular debris centrally. This involves the splenic flexure of the colon and is new since previous study. SUV max measures 26.5. This is suspicious for a perforated colon carcinoma.  Hypermetabolic lymphadenopathy is seen in the central abdominal mesentery medial to this mass, which measures 2.7 cm on image 145/series 3 with SUV max of 9.6. There is also a 8 mm hypermetabolic retroperitoneal lymph node in the aortocaval space on image 151/series 3, which has SUV max of 14.0. No hypermetabolic lymphadenopathy seen within the pelvis.  4.0 cm infrarenal abdominal aortic aneurysm is again  seen, without significant change.  SKELETON  No focal hypermetabolic activity to suggest skeletal  metastasis.  IMPRESSION: 10 cm left upper quadrant peripherally hypermetabolic mass with central stool or necrotic debris which involves the splenic flexure the colon. This is suspicious for perforated colon carcinoma.  Hypermetabolic peritoneal soft tissue nodules in the abdomen and pelvis and minimal ascites, consistent with peritoneal metastatic disease.  Mild hypermetabolic mesenteric and retroperitoneal lymphadenopathy, consistent with metastatic disease.  6 mm hypermetabolic mediastinal lymph node just posterior to the lower thoracic esophagus. Metastatic disease cannot be excluded.  Stable 4.0 cm infrarenal abdominal aortic aneurysm.   Electronically Signed   By: Earle Gell M.D.   On: 08/29/2015 14:51    ASSESSMENT: 1.  Acute renal failure.  Intravenous fluid is resulting in to third spacing and not effectively raising intravascular fluid balance. Nephrology consult has been appreciated.  Overall urine output is gradually improving. Will monitor serum creatinine in urine output 2.  Perforated colon which is contained but leukocytosis persists.  On IV antibiotics. 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   pathology report is being awaited.. Final diagnosis was colon cancer stage IV disease versus recurrent lymphoma 3.  Congestive heart failure with coronary artery disease   All lab data has been reviewed. Nephrologist note has been reviewed.  Discussion with surgeon regarding overall management. I had prolonged discussion with patient's daughter, sister, wife and discuss overall poor prognosis. I will hold off making any decision regarding further therapy till final diagnosis is available. Total duration of visit was 45 minutes.  50% or more time was spent in counseling patient and family regarding prognosis and options of  treatment and available resources.  Palliative care note has been evaluated and would like palliative care physician to hold off any further discussion regarding future chemotherapy or treatment option.  Final pathology report is available. We certainly can make some decision regarding living will  Dr.Brahmanden will cover my patient when I am on vacation All lab data has been reviewed Lab data would be rechecked tomorrow Repeat hemoglobin has dropped by 0.3 g and will be monitored    Patient expressed understanding and was in agreement with this plan. He also understands that He can call clinic at any time with any questions, concerns, or complaints.    No matching staging information was found for the patient.  Forest Gleason, MD   09/11/2015 7:52 PM

## 2015-09-11 NOTE — Consult Note (Signed)
Palliative Medicine Inpatient Consult Note   Name: Craig Page Date: 09/11/2015 MRN: [id number]  DOB: 05/02/1946  Referring Physician: Forest Gleason, MD  Palliative Care consult requested for this 69 y.o. male for goals of medical therapy in patient with what is thought to be metastatic colon cancer.  Patient has a history of stage IIIA Follicular NHL admitted via the oncology clinic with a perforated colon.  He completed maintenance chemo for lymphoma in Oct 2012.  A PET scan in March 2015 showed complete remission.  Then, another PET scan prior to heart surgery in Nov 2015, was reportedly negative.  He had an eval by cardiologist recently and note was made of low hemoglobin and high WBC, so he was referred to Dr. Oliva Bustard on 08/27/15.  BP was low and clinical concern was for recurrence of lymphoma so another PET scan was scheduled.  PEt on 08/29/15 showed LUQ mass with central stool and necrotic debgris at splenic flexure.  This was suspicious for perforated colon cancer.  There were hypermetabolic pertioneal soft tissue nodules in the abdomen and pelvis with minimal ascites c/w metastatic disease.  He has never had a colonoscopy.  He has lost 40 lbs in the last year.  He has not seen melena or hematochezia.    _______________________________________________________________________________ IMPRESSION: 1.  Colon Mass at splenic flexure ----now post op day 1 from Laparoscopy biopsy of peritoneal nodules and transverse colon with colostomy ----apparently, the mass was left where it was since it may involve the spleen. 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  Acute renal failure due to ATN due to dehydration most likely ---Cr 5.03 today (was 4.91 yesterday) ---Nephrology consulted and IV fluids are ordered (decreased due to third spacing) ---may need temporary HD 3. Anemia likely iron deficient from chronic blood  loss due to colon malignancy ---there is blood in ostomy bag  Hgb 8.8 ()but has CHF so may need better HGB) 4.  Leukocytosis - WBC 23 K on ABX for perforated colon ---Bld cxs neg and urine cx neg x 2 days 5.  Metabolic Acidosis due to poor perfusion  6.  Hypomagnesemia 7.  Severe Malnutrition (Albumin is 1.9 and wt loss for 1 yr of 40 lbs) 8.  Cardiomyopathy ---likely ischemic in nature given h/o CAD ---last echo in record from 10/20/15 shows EF of only 30 to 35% with no SMAs ---pt had outpt echo on 9/30 --need results but one note says EF =43% 9.  Pulmonary Diffusion Defect as seen in PFTs done 10/17/15 10. AAA size was 3.7 x 4.2 cm in Nov 2015 11. Mild emphysema (imaging diagnosis on CT Nov 11/15) 12.  Atherosclerosis and Peripheral Artery Disease 13.  H/O right Frontal Infarct as seen on Ct Nov 2015. 40. CAD with h/o CABG x3  in Nov 2015 and h/o MI in 1992 and also in Nov 2015  (followed by Dr Humphrey Rolls) 36.  Carotid Artery Stenosis  -----R = 100% and L is moderate (per post CABG follow up note from 11/01/15 )  16.  Atrial Fibrillation ----on amiodarone at home 200mg  daily but no anticoagulant apparently  17,  Hyperlipidemia 18.  H/O 44 packyear tobacco smoking (quit recently) 19.  Hypotension 20. Weakness  TODAY'S DISCUSSION, DECISIONS, AND PLANS:  1.  Pt is not at his best decision-making state at this time.  I have been told that 'he doesn't know how bad it is' by his sister.  Pt opted  to be Full Code at this time, but we need to talk again, as I do not think he knows fully what is going on.  2.  I was not able to get much discussion from the pt. His sister and brother were here, but whether they were in the room or not, the patient had almost nothing to say.  His sister advised me to talk to his wife and I will do so tomorrow.    3.  Patient's numerous medical problems listed above are alone enough to limit his life expectancy. His functional status is very poor.  Will talk with Dr.  Oliva Bustard about whether or not he envisions providing any treatment options for this unfortunate patient.    ________________________________________________________________________________     REVIEW OF SYSTEMS:  No appetite.  Reasonable urine output.  Diarrhea for about a month. Poor intake for a couple of weeks.  C/O pain in Left upper quadrant of abdomen.  Runny nose for 20 yrs.  No fevers or chills.  No nausea or vomiting.    SPIRITUAL SUPPORT SYSTEM: Yes.  SOCIAL HISTORY:  reports that he quit smoking about a year ago. His smoking use included Cigarettes. He has a 50 pack-year smoking history. He has never used smokeless tobacco. He reports that he does not drink alcohol or use illicit drugs.  LEGAL DOCUMENTS:  None in record  CODE STATUS: Full code  PAST MEDICAL HISTORY: Past Medical History  Diagnosis Date  . CAD (coronary artery disease)     3v  . Ischemic cardiomyopathy   . Chronic systolic heart failure ([place])   . HTN (hypertension)   . HLD (hyperlipidemia)   . Follicular lymphoma ([place])   . Myocardial infarction [facility]) 1992    treated with thrombolytics/notes 10/16/2014    PAST SURGICAL HISTORY:  Past Surgical History  Procedure Laterality Date  . Hernia repair    . Umbilical hernia repair  1964  . Coronary artery bypass graft N/A 10/19/2014    Procedure: CORONARY ARTERY BYPASS GRAFTING (CABG);  Surgeon: Ivin Poot, MD;  Location: [place];  Service: Open Heart Surgery;  Laterality: N/A;  Times 3 using left internal mammary artery and endoscopically harvested left saphenous vein  . Intraoperative transesophageal echocardiogram N/A 10/19/2014    Procedure: INTRAOPERATIVE TRANSESOPHAGEAL ECHOCARDIOGRAM;  Surgeon: Ivin Poot, MD;  Location: [place];  Service: Open Heart Surgery;  Laterality: N/A;  . Laparoscopy N/A 09/10/2015    Procedure: LAPAROSCOPY DIAGNOSTIC;  Surgeon: Christene Lye, MD;  Location: [facility];  Service: General;  Laterality: N/A;  .  Laparotomy N/A 09/10/2015    Procedure: EXPLORATORY LAPAROTOMY;  Surgeon: Christene Lye, MD;  Location: [facility];  Service: General;  Laterality: N/A;  . Transverse loop colostomy Right 09/10/2015    Procedure: TRANSVERSE LOOP COLOSTOMY;  Surgeon: Christene Lye, MD;  Location: [facility];  Service: General;  Laterality: Right;    ALLERGIES:  has No Known Allergies.  MEDICATIONS:  Current Facility-Administered Medications  Medication Dose Route Frequency Provider Last Rate Last Dose  . 0.9 %  sodium chloride infusion   Intravenous Continuous Lavonia Dana, MD 75 mL/hr at 09/11/15 1156 75 mL/hr at 09/11/15 1156  . acetaminophen (TYLENOL) tablet 650 mg  650 mg Oral Q6H PRN Seeplaputhur Robinette Haines, MD       Or  . acetaminophen (TYLENOL) suppository 650 mg  650 mg Rectal Q6H PRN Seeplaputhur Robinette Haines, MD      . amiodarone (PACERONE) tablet 200 mg  200 mg Oral Daily Seeplaputhur Robinette Haines, MD   200 mg at 09/10/15 1948  . heparin injection 5,000 Units  5,000 Units Subcutaneous Q12H Seeplaputhur Robinette Haines, MD      . morphine 2 MG/ML injection 2 mg  2 mg Intravenous Q3H PRN Christene Lye, MD   2 mg at 09/11/15 0416  . ondansetron (ZOFRAN) 4 mg in sodium chloride 0.9 % 50 mL IVPB   Intravenous Q4H PRN Forest Gleason, MD      . ondansetron (ZOFRAN) tablet 4-8 mg  4-8 mg Oral Q8H PRN Seeplaputhur Robinette Haines, MD      . oxyCODONE (Oxy IR/ROXICODONE) immediate release tablet 5-10 mg  5-10 mg Oral Q4H PRN Christene Lye, MD   5 mg at 09/11/15 1341  . pantoprazole (PROTONIX) EC tablet 40 mg  40 mg Oral Daily Seeplaputhur Robinette Haines, MD   40 mg at 09/11/15 1157  . piperacillin-tazobactam (ZOSYN) IVPB 3.375 g  3.375 g Intravenous Q12H Forest Gleason, MD   3.375 g at 09/11/15 1156  . simethicone (MYLICON) chewable tablet 80 mg  80 mg Oral TID PRN Christene Lye, MD        Vital Signs: BP 107/58 mmHg  Pulse 81  Temp(Src) 98 F (36.7 C) (Oral)  Resp 18  Ht 5\' 11"  (1.803 m)  Wt  74.889 kg (165 lb 1.6 oz)  BMI 23.04 kg/m2  SpO2 96% Filed Weights   09/09/15 1402  Weight: 74.889 kg (165 lb 1.6 oz)    Estimated body mass index is 23.04 kg/(m^2) as calculated from the following:   Height as of this encounter: 5\' 11"  (1.803 m).   Weight as of this encounter: 74.889 kg (165 lb 1.6 oz).  PERFORMANCE STATUS (ECOG) : 4 - Bedbound  PHYSICAL EXAM:       LABS: CBC:    Component Value Date/Time   WBC 22.9* 09/11/2015 1230   WBC 10.4 10/16/2014 0458   HGB 8.8* 09/11/2015 1230   HGB 12.1* 10/16/2014 0458   HCT 26.9* 09/11/2015 1230   HCT 36.4* 10/16/2014 0458   PLT 196 09/11/2015 1230   PLT 186 10/16/2014 0458   MCV 80.5 09/11/2015 1230   MCV 85 10/16/2014 0458   NEUTROABS 19.7* 09/11/2015 1230   NEUTROABS 6.3 10/16/2014 0458   LYMPHSABS 1.0 09/11/2015 1230   LYMPHSABS 2.7 10/16/2014 0458   MONOABS 1.2* 09/11/2015 1230   MONOABS 1.1* 10/16/2014 0458   EOSABS 0.9* 09/11/2015 1230   EOSABS 0.4 10/16/2014 0458   BASOSABS 0.1 09/11/2015 1230   BASOSABS 0.1 10/16/2014 0458   Comprehensive Metabolic Panel:    Component Value Date/Time   NA 137 09/11/2015 0433   NA 139 10/16/2014 0458   K 4.8 09/11/2015 0433   K 4.4 10/16/2014 0458   CL 110 09/11/2015 0433   CL 105 10/16/2014 0458   CO2 19* 09/11/2015 0433   CO2 26 10/16/2014 0458   BUN 41* 09/11/2015 0433   BUN 22* 10/16/2014 0458   CREATININE 5.03* 09/11/2015 0433   CREATININE 1.32* 10/16/2014 0458   GLUCOSE 96 09/11/2015 0433   GLUCOSE 87 10/16/2014 0458   CALCIUM 7.3* 09/11/2015 0433   CALCIUM 8.4* 10/16/2014 0458   AST 11* 09/11/2015 0433   AST 8* 10/15/2014 1210   ALT 6* 09/11/2015 0433   ALT 19 10/15/2014 1210   ALKPHOS 147* 09/11/2015 0433   ALKPHOS 88 10/15/2014 1210   BILITOT 0.8 09/11/2015 0433   BILITOT 0.4 10/15/2014 1210   PROT  3.8* 09/11/2015 0433   PROT 6.4 10/15/2014 1210   ALBUMIN 1.9* 09/11/2015 0433   ALBUMIN 3.3* 10/15/2014 1210     More than 50% of the visit was  spent in counseling/coordination of care: Yes  Time Spent: 80 minutes

## 2015-09-11 NOTE — Plan of Care (Signed)
Problem: Discharge Progression Outcomes Goal: Other Discharge Outcomes/Goals Outcome: Progressing Patient has been up on side of bed 3 times today, remains hypotensive MD aware, Dr. Juleen China seen patient today for worsening renal function (continues to make urine in foley) as well as Dr. Jamal Collin stoma pink and beefy, continues to bleed a little into bag all MD aware and repeat CBC drawn at noon and was stable.  Patient eating clear liquids and tolerating them,  PRN pain medications helping with pain, goal is to get Patient up and sitting in chair to increase activity.

## 2015-09-11 NOTE — Progress Notes (Signed)
[facility]  ROUNDING NOTE   Subjective:   Colectomy yesterday. POD 1 by Dr. Jamal Collin. Found to have metastatic malignancy. Concern for colon being primary.  Creatinine 5.03 (4.91) UOP 525  Blood in ostomy. Hemoglobin 8.3 (9.1)  Objective:  Vital signs in last 24 hours:  Temp:  [97 F (36.1 C)-98.2 F (36.8 C)] 97.6 F (36.4 C) (10/05 0454) Pulse Rate:  [66-88] 79 (10/05 0454) Resp:  [18-21] 18 (10/05 0454) BP: (90-108)/(48-67) 100/56 mmHg (10/05 0454) SpO2:  [93 %-98 %] 94 % (10/05 0454)  Weight change:  Filed Weights   09/09/15 1402  Weight: 74.889 kg (165 lb 1.6 oz)    Intake/Output: I/O last 3 completed shifts: In: 900 [I.V.:900] Out: 825 [Urine:825]   Intake/Output this shift:     Physical Exam: General: NAD  Head: Normocephalic, atraumatic. Dry oral mucosal membranes  Eyes: Anicteric, PERRL  Neck: Supple, trachea midline  Lungs:  Clear to auscultation  Heart: Regular rate and rhythm  Abdomen:  Soft, nontender,   Extremities:  trace peripheral edema.  Neurologic: Nonfocal, moving all four extremities  Skin: No lesions  GU foley    Basic Metabolic Panel:  Recent Labs Lab 09/06/15 1255 09/09/15 1125 09/10/15 0530 09/11/15 0433  NA 137 130* 134* 137  K 4.0 3.8 4.5 4.8  CL 106 100* 110 110  CO2 25 19* 19* 19*  GLUCOSE 110* 159* 105* 96  BUN 11 32* 32* 41*  CREATININE 1.25* 4.88* 4.91* 5.03*  CALCIUM 7.6* 7.0* 7.0* 7.3*  MG  --   --  1.6*  --     Liver Function Tests:  Recent Labs Lab 09/06/15 1255 09/09/15 1125 09/10/15 0530 09/11/15 0433  AST 15 15 12* 11*  ALT 8* 7* 7* 6*  ALKPHOS 131* 187* 191* 147*  BILITOT 0.8 0.9 0.9 0.8  PROT 4.8* 4.9* 4.1* 3.8*  ALBUMIN 2.4* 2.3* 2.1* 1.9*    Recent Labs Lab 09/06/15 1255  LIPASE 11*   No results for input(s): AMMONIA in the last 168 hours.  CBC:  Recent Labs Lab 09/06/15 1255 09/09/15 1125 09/10/15 0530 09/11/15 0433  WBC 20.8* 19.3* 21.9* 18.5*  NEUTROABS  --   16.0*  --  15.4*  HGB 9.8* 9.9* 9.1* 8.3*  HCT 30.5* 31.1* 28.5* 25.9*  MCV 79.1* 80.0 79.3* 79.9*  PLT 226 203 200 193    Cardiac Enzymes:  Recent Labs Lab 09/06/15 1255  TROPONINI <0.03    BNP: Invalid input(s): POCBNP  CBG: No results for input(s): GLUCAP in the last 168 hours.  Microbiology: Results for orders placed or performed during the hospital encounter of 09/09/15  Culture, blood (routine x 2)     Status: None (Preliminary result)   Collection Time: 09/09/15  2:59 PM  Result Value Ref Range Status   Specimen Description BLOOD RIGHT ASSIST CONTROL  Final   Special Requests AER 10ML ANA 10ML  Final   Culture NO GROWTH 2 DAYS  Final   Report Status PENDING  Incomplete  Culture, blood (routine x 2)     Status: None (Preliminary result)   Collection Time: 09/09/15  3:38 PM  Result Value Ref Range Status   Specimen Description BLOOD RIGHT ARM  Final   Special Requests AER 10ML ANA 10ML  Final   Culture NO GROWTH 2 DAYS  Final   Report Status PENDING  Incomplete  Urine culture     Status: None (Preliminary result)   Collection Time: 09/09/15  6:34 PM  Result  Value Ref Range Status   Specimen Description URINE, CLEAN CATCH  Final   Special Requests NONE  Final   Culture NO GROWTH < 12 HOURS  Final   Report Status PENDING  Incomplete    Coagulation Studies: No results for input(s): LABPROT, INR in the last 72 hours.  Urinalysis:  Recent Labs  09/09/15 1834  COLORURINE YELLOW*  LABSPEC 1.004*  PHURINE 6.0  GLUCOSEU NEGATIVE  HGBUR NEGATIVE  BILIRUBINUR NEGATIVE  KETONESUR NEGATIVE  PROTEINUR NEGATIVE  NITRITE NEGATIVE  LEUKOCYTESUR TRACE*      Imaging: Ct Abdomen Pelvis Wo Contrast  09/09/2015   CLINICAL DATA:  Initial encounter for increasing left upper quadrant pain in a patient with history of lymphoma. 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  EXAM: CT ABDOMEN AND PELVIS WITHOUT CONTRAST   TECHNIQUE: Multidetector CT imaging of the abdomen and pelvis was performed following the standard protocol without IV contrast.  COMPARISON:  08/29/2015.  FINDINGS: Lower chest: Emphysema with chronic interstitial changes noted in the lung bases. A posterior focus of collapse/consolidation is seen in the left lower lobe. 2.1 cm nodular pleural-based lesion is seen at the dome of the left hemidiaphragm.  Hepatobiliary: Nodularity along the anterior liver margin raises the question of cirrhosis. There is a new 2.2 cm low-density lesion in the lateral segment left liver (image 13 series 2). A new 2.4 cm lesion is seen in the inferior right liver (image 30). These are suspicious for metastatic disease given the rapid interval appearance. There is no evidence for gallstones, gallbladder wall thickening, or pericholecystic fluid. No intrahepatic or extrahepatic biliary dilation.  Pancreas: No focal mass lesion. No dilatation of the main duct. No intraparenchymal cyst. No peripancreatic edema.  Spleen: No splenomegaly. No focal mass lesion.  Adrenals/Urinary Tract: Right adrenal gland is unremarkable. 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 Kidneys are unremarkable. No evidence for hydroureter. The urinary bladder appears normal for the degree of distention.  Stomach/Bowel: Tiny hiatal hernia noted. Stomach otherwise unremarkable. No small bowel wall thickening. No small bowel dilatation. The terminal ileum is normal. The appendix is normal. Large lesion involving the splenic flexure of the colon again noted. No evidence for obstruction.  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 Small nodules are seen along the margin of the large splenic flexure lesion with a conglomeration of lymph nodes identified in the central small bowel mesentery measuring 2.9 x 3.4 cm.  Reproductive: Prostate gland is mildly enlarged.  Other: Small volume intraperitoneal free fluid is evident.   Musculoskeletal: Bone windows reveal no worrisome lytic or sclerotic osseous lesions.  IMPRESSION: 1. No substantial change in the large necrotic/perforated splenic flexure mass with adjacent nodularity and central mesenteric lymphadenopathy. 2. Interval development of hypo attenuating lesions in the liver parenchyma, suspicious for metastatic disease. 3. Interval development of small volume intraperitoneal ascites 4. 3.9 cm abdominal aortic aneurysm   Electronically Signed   By: Misty Stanley M.D.   On: 09/09/2015 16:39     Medications:   . sodium chloride 125 mL/hr at 09/11/15 0610   . amiodarone  200 mg Oral Daily  . carvedilol  3.125 mg Oral BID  . pantoprazole  40 mg Oral Daily  . piperacillin-tazobactam (ZOSYN)  IV  3.375 g Intravenous Q12H   acetaminophen **OR** acetaminophen, morphine injection, ondansetron (ZOFRAN) IVPB [facility] +/- dexamethasone, ondansetron, oxyCODONE, simethicone  Assessment/ Plan:  [name] is a 69 y.o.white  male with coronary artery disease, congestive heart failure, hypertension, hyperlipidemia, follicular lymphoma who was admitted to [facility] on 09/09/2015 for acute renal failure. Colonic obstruction   1. Acute Renal Failure: no obstructive uropathy on CT. However seems to be due to acute ATN. Urine output improving Other etiology does include lymphoma induced membranous glomerulonephritis.  - Continue IV fluids - will decrease to NS at 75 due to third spacing - monitor serum electrolytes and urine output. No acute indication for dialysis at this time. Low threshold for temporary dialysis. Discussed with patient.   2. Hypertension: hypotensive.  - discontinue carvedilol.   3. Anemia: with persistent leukocytosis. Anemia is long standing. However now trending downward and has blood in ostomy bag.  - Will have surgery address.   4. Chronic Systolic Congestive Heart Failure: not acute exacerbation. Monitor volume status. EF 35-40% on echo from 10/2014.  Currently on diuretics.     LOS: [place] 10/5/201611:14 AM

## 2015-09-12 ENCOUNTER — Telehealth: Payer: Self-pay

## 2015-09-12 LAB — CBC WITH DIFFERENTIAL/PLATELET
BASOS ABS: 0 10*3/uL (ref 0–0.1)
BASOS PCT: 0 %
EOS ABS: 1.2 10*3/uL — AB (ref 0–0.7)
Eosinophils Relative: 6 %
HCT: 25.3 % — ABNORMAL LOW (ref 40.0–52.0)
Hemoglobin: 8.1 g/dL — ABNORMAL LOW (ref 13.0–18.0)
LYMPHS PCT: 8 %
Lymphs Abs: 1.6 10*3/uL (ref 1.0–3.6)
MCH: 26 pg (ref 26.0–34.0)
MCHC: 32.1 g/dL (ref 32.0–36.0)
MCV: 80.8 fL (ref 80.0–100.0)
MONO ABS: 1.4 10*3/uL — AB (ref 0.2–1.0)
Monocytes Relative: 7 %
NEUTROS PCT: 79 %
Neutro Abs: 16.3 10*3/uL — ABNORMAL HIGH (ref 1.4–6.5)
PLATELETS: 194 10*3/uL (ref 150–440)
RBC: 3.14 MIL/uL — AB (ref 4.40–5.90)
RDW: 23.5 % — AB (ref 11.5–14.5)
SMEAR REVIEW: ADEQUATE
WBC: 20.5 10*3/uL — AB (ref 3.8–10.6)

## 2015-09-12 LAB — COMPREHENSIVE METABOLIC PANEL
ALBUMIN: 1.8 g/dL — AB (ref 3.5–5.0)
ALT: 7 U/L — ABNORMAL LOW (ref 17–63)
AST: 11 U/L — AB (ref 15–41)
Alkaline Phosphatase: 150 U/L — ABNORMAL HIGH (ref 38–126)
Anion gap: 4 — ABNORMAL LOW (ref 5–15)
BUN: 45 mg/dL — AB (ref 6–20)
CHLORIDE: 111 mmol/L (ref 101–111)
CO2: 20 mmol/L — AB (ref 22–32)
CREATININE: 5.37 mg/dL — AB (ref 0.61–1.24)
Calcium: 7.2 mg/dL — ABNORMAL LOW (ref 8.9–10.3)
GFR calc Af Amer: 11 mL/min — ABNORMAL LOW (ref >60)
GFR calc non Af Amer: 10 mL/min — ABNORMAL LOW (ref >60)
Glucose, Bld: 80 mg/dL (ref 65–99)
POTASSIUM: 4.6 mmol/L (ref 3.5–5.1)
SODIUM: 135 mmol/L (ref 135–145)
Total Bilirubin: 1.1 mg/dL (ref 0.3–1.2)
Total Protein: 3.9 g/dL — ABNORMAL LOW (ref 6.5–8.1)

## 2015-09-12 LAB — CYTOLOGY - NON PAP

## 2015-09-12 MED ORDER — ALBUMIN HUMAN 25 % IV SOLN
25.0000 g | Freq: Four times a day (QID) | INTRAVENOUS | Status: AC
  Administered 2015-09-12 – 2015-09-13 (×5): 25 g via INTRAVENOUS
  Filled 2015-09-12 (×6): qty 100

## 2015-09-12 MED ORDER — SODIUM CHLORIDE 0.9 % IJ SOLN: 10.0000 mL | INTRAMUSCULAR | Status: DC | PRN

## 2015-09-12 MED ORDER — FAMOTIDINE 20 MG PO TABS
20.0000 mg | ORAL_TABLET | Freq: Every day | ORAL | Status: DC
  Administered 2015-09-12 – 2015-09-24 (×13): 20 mg via ORAL
  Filled 2015-09-12 (×13): qty 1

## 2015-09-12 MED ORDER — SODIUM CHLORIDE 0.9 % IJ SOLN
10.0000 mL | Freq: Two times a day (BID) | INTRAMUSCULAR | Status: DC
  Administered 2015-09-12 – 2015-09-16 (×7): 10 mL
  Administered 2015-09-16: 09:00:00 30 mL
  Administered 2015-09-18: 22:00:00 20 mL
  Administered 2015-09-18: 10 mL
  Administered 2015-09-19: 30 mL
  Administered 2015-09-20 – 2015-09-22 (×3): 10 mL

## 2015-09-12 NOTE — Progress Notes (Signed)
[facility]  ROUNDING NOTE   Subjective:   Patient laying in bed. No specific complaints.  Creatinine 5.37 (5.03) (4.91) UOP 1225  Blood in ostomy. Hemoglobin 8.1 (8.3) (9.1)  Objective:  Vital signs in last 24 hours:  Temp:  [97.9 F (36.6 C)-98.1 F (36.7 C)] 97.9 F (36.6 C) (10/06 0449) Pulse Rate:  [77-89] 77 (10/05 2113) Resp:  [18] 18 (10/06 0449) BP: (88-107)/(54-65) 98/65 mmHg (10/06 0449) SpO2:  [92 %-96 %] 92 % (10/06 0449)  Weight change:  Filed Weights   09/09/15 1402  Weight: 74.889 kg (165 lb 1.6 oz)    Intake/Output: I/O last 3 completed shifts: In: 24 [P.O.:760] Out: 1450 [Urine:1450]   Intake/Output this shift:  Total I/O In: 750 [P.O.:750] Out: -   Physical Exam: General: NAD  Head: Normocephalic, atraumatic. Dry oral mucosal membranes  Eyes: Anicteric, PERRL  Neck: Supple, trachea midline  Lungs:  Clear to auscultation  Heart: Regular rate and rhythm  Abdomen:  Soft, nontender, +right upper quadrant ostomy with some blood in bag  Extremities:  trace peripheral edema.  Neurologic: Nonfocal, moving all four extremities  Skin: No lesions  GU foley    Basic Metabolic Panel:  Recent Labs Lab 09/06/15 1255 09/09/15 1125 09/10/15 0530 09/11/15 0433 09/12/15 0440  NA 137 130* 134* 137 135  K 4.0 3.8 4.5 4.8 4.6  CL 106 100* 110 110 111  CO2 25 19* 19* 19* 20*  GLUCOSE 110* 159* 105* 96 80  BUN 11 32* 32* 41* 45*  CREATININE 1.25* 4.88* 4.91* 5.03* 5.37*  CALCIUM 7.6* 7.0* 7.0* 7.3* 7.2*  MG  --   --  1.6*  --   --     Liver Function Tests:  Recent Labs Lab 09/06/15 1255 09/09/15 1125 09/10/15 0530 09/11/15 0433 09/12/15 0440  AST 15 15 12* 11* 11*  ALT 8* 7* 7* 6* 7*  ALKPHOS 131* 187* 191* 147* 150*  BILITOT 0.8 0.9 0.9 0.8 1.1  PROT 4.8* 4.9* 4.1* 3.8* 3.9*  ALBUMIN 2.4* 2.3* 2.1* 1.9* 1.8*    Recent Labs Lab 09/06/15 1255  LIPASE 11*   No results for input(s): AMMONIA in the last 168  hours.  CBC:  Recent Labs Lab 09/09/15 1125 09/10/15 0530 09/11/15 0433 09/11/15 1230 09/12/15 0440  WBC 19.3* 21.9* 18.5* 22.9* 20.5*  NEUTROABS 16.0*  --  15.4* 19.7* 16.3*  HGB 9.9* 9.1* 8.3* 8.8* 8.1*  HCT 31.1* 28.5* 25.9* 26.9* 25.3*  MCV 80.0 79.3* 79.9* 80.5 80.8  PLT 203 200 193 196 194    Cardiac Enzymes:  Recent Labs Lab 09/06/15 1255  TROPONINI <0.03    BNP: Invalid input(s): POCBNP  CBG: No results for input(s): GLUCAP in the last 168 hours.  Microbiology: Results for orders placed or performed during the hospital encounter of 09/09/15  Culture, blood (routine x 2)     Status: None (Preliminary result)   Collection Time: 09/09/15  2:59 PM  Result Value Ref Range Status   Specimen Description BLOOD RIGHT ASSIST CONTROL  Final   Special Requests AER 10ML ANA 10ML  Final   Culture NO GROWTH 3 DAYS  Final   Report Status PENDING  Incomplete  Culture, blood (routine x 2)     Status: None (Preliminary result)   Collection Time: 09/09/15  3:38 PM  Result Value Ref Range Status   Specimen Description BLOOD RIGHT ARM  Final   Special Requests AER 10ML ANA 10ML  Final   Culture NO  GROWTH 3 DAYS  Final   Report Status PENDING  Incomplete  Urine culture     Status: None   Collection Time: 09/09/15  6:34 PM  Result Value Ref Range Status   Specimen Description URINE, CLEAN CATCH  Final   Special Requests NONE  Final   Culture NO GROWTH 1 DAY  Final   Report Status 09/11/2015 FINAL  Final    Coagulation Studies: No results for input(s): LABPROT, INR in the last 72 hours.  Urinalysis:  Recent Labs  09/09/15 1834  COLORURINE YELLOW*  LABSPEC 1.004*  PHURINE 6.0  GLUCOSEU NEGATIVE  HGBUR NEGATIVE  BILIRUBINUR NEGATIVE  KETONESUR NEGATIVE  PROTEINUR NEGATIVE  NITRITE NEGATIVE  LEUKOCYTESUR TRACE*      Imaging: No results found.   Medications:   . sodium chloride 75 mL/hr at 09/12/15 1002   . albumin human  25 g Intravenous Q6H  .  amiodarone  200 mg Oral Daily  . famotidine  20 mg Oral Daily  . heparin subcutaneous  5,000 Units Subcutaneous Q12H  . pantoprazole  40 mg Oral Daily  . piperacillin-tazobactam (ZOSYN)  IV  3.375 g Intravenous Q12H   acetaminophen **OR** acetaminophen, morphine injection, ondansetron (ZOFRAN) IVPB [facility] +/- dexamethasone, ondansetron, oxyCODONE, simethicone  Assessment/ Plan:  [name] is a 69 y.o.white male with coronary artery disease, congestive heart failure, hypertension, hyperlipidemia, follicular lymphoma who was admitted to [facility] on 09/09/2015 for acute renal failure. Colonic obstruction   1. Acute Renal Failure: most likely ATN. Urine output nonoliguric and improving.  - Continue IV fluids - NS at 75 - Start albumin IV 25mg  q6 x 6 doses - monitor serum electrolytes and urine output. No acute indication for dialysis at this time. Low threshold for temporary dialysis. Discussed with patient. Appreciate Palliative care.   2. Hypertension: hypotensive.  - discontinued carvedilol.   3. Anemia: with persistent leukocytosis. Anemia is long standing. However now trending downward and has blood in ostomy bag.  - As per oncology  4. Chronic Systolic Congestive Heart Failure: not acute exacerbation. Monitor volume status. EF 35-40% on echo from 10/2014. Currently on diuretics.     LOS: 3 [place] 10/6/201610:49 AM

## 2015-09-12 NOTE — Progress Notes (Signed)
Craig Page   DOB:Oct 10, 1946   GN#:[id number]    CC: Likely metastatic colon cancer status post palliative surgery  Subjective:   Patient has intermittent abdominal pain and generalized. This is not getting any worse. His appetite is fair. He is on liquid diet. Mild nausea no vomiting. He is interested in ambulation.  ROS: No fever no chills. No diarrhea.   Objective:  Filed Vitals:   09/12/15 1413  BP: 98/60  Pulse: 77  Temp: 98.5 F (36.9 C)  Resp:      Intake/Output Summary (Last 24 hours) at 09/12/15 1648 Last data filed at 09/12/15 0956  Gross per 24 hour  Intake   1510 ml  Output   1225 ml  Net    285 ml    GENERAL:alert, no distress and comfortable SKIN: skin color, texture, turgor are normal, no rashes or significant lesions EYES: Positive for pallor. OROPHARYNX:no  Thrush or ulceration. LYMPH:  no palpable lymphadenopathy in the cervical, axillary or inguinal LUNGS: Decreased breath sounds bilaterally.  HEART: regular rate & rhythm and no murmurs and no lower extremity edema ABDOMEN:abdomen soft, mild distention noted mildly tender. No rigidity or guarding. normal bowel sounds Musculoskeletal:no cyanosis of digits and no clubbing  NEURO: alert & oriented x 3 with fluent speech, no focal motor/sensory deficits   Labs:  Lab Results  Component Value Date   WBC 20.5* 09/12/2015   HGB 8.1* 09/12/2015   HCT 25.3* 09/12/2015   MCV 80.8 09/12/2015   PLT 194 09/12/2015   NEUTROABS 16.3* 09/12/2015    Lab Results  Component Value Date   NA 135 09/12/2015   K 4.6 09/12/2015   CL 111 09/12/2015   CO2 20* 09/12/2015    Studies:  No results found.  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 Status post palliative surgery  # Acute renal failure creatinine up to 5.3/ however patient making urine-nonoliguric. Appreciate the recommendations of nephrology.  #  leukocytosis-likely reactive/anemia- hemoglobin 8.1- monitor for now. If hemoglobin less than 8, will plan transfusion tomorrow.  # Ambulation/physical therapy- reviewed with the patient is motivated. I also reviewed with the nurse   Above plan of care was discussed with the patient in detail.    Cammie Sickle, MD 09/12/2015  4:48 PM

## 2015-09-12 NOTE — Progress Notes (Signed)
Paged and spoke with Dr. Lavetta Nielsen about pt reporting indigestion. Pt is currently taking protonix and has simethicone PRN. Pt report no relief after admin of simethicone and ginger ale. Awaiting MD new order. Will continue to monitor.

## 2015-09-12 NOTE — Plan of Care (Addendum)
Problem: Discharge Progression Outcomes Goal: Other Discharge Outcomes/Goals Outcome: Progressing Plan of care progress to goal for: IVF and IV ABX infusing. PT consult ordered. Pt continues with foley. Colostomy WDL. Pt has abdominal pain, morphine given. Patient stated relief. IV albumin added today for renal function. Family at the bedside periodically.

## 2015-09-12 NOTE — Care Management Important Message (Signed)
Important Message  Patient Details  Name: FENRIS CAUBLE MRN: [id number] Date of Birth: Feb 17, 1946   Medicare Important Message Given:  Yes-third notification given    Shelbie Ammons, RN 09/12/2015, 8:24 AM

## 2015-09-12 NOTE — Plan of Care (Signed)
Problem: Discharge Progression Outcomes Goal: Other Discharge Outcomes/Goals Outcome: Progressing Plan of Care Progress to Goal:   Pt report indigestion during shift. Pt report nothing helped thus far. Pt is ordered pepcid daily. Pt kidney function is not improving. Pt has report pain with movement x1. No other signs of distress noted. Will continue to monitor.

## 2015-09-12 NOTE — Progress Notes (Signed)
PT Cancellation Note  Patient Details Name: Craig Page MRN: [id number] DOB: [birth year]   Cancelled Treatment:    Reason Eval/Treat Not Completed: Other (comment) (See PT note for further details) PT attempted this afternoon, however pt currently experiencing 9/10 pain as well as fatigue. Pt wishes for PT to come back tomorrow. Nurse approving re-attempt tomorrow when pt in less pain.    Janyth Contes 09/12/2015, 3:42 PM  Janyth Contes, SPT. [phone]

## 2015-09-12 NOTE — Telephone Encounter (Signed)
Called to get patient in for updating the Medicare paperwork. Patient states he is at [facility] now seeing  Ma Hillock for Colon Cancer and has no time to call or come in. Noted.[employer]

## 2015-09-12 NOTE — Progress Notes (Signed)
SUBJECTIVE: has abdominal pain   Filed Vitals:   09/11/15 1445 09/11/15 1721 09/11/15 2113 09/12/15 0449  BP: 107/58 101/63 101/58 98/65  Pulse:  89 77   Temp:  98.1 F (36.7 C) 98 F (36.7 C) 97.9 F (36.6 C)  TempSrc:  Oral Oral Oral  Resp:   18 18  Height:      Weight:      SpO2:  95% 93% 92%    Intake/Output Summary (Last 24 hours) at 09/12/15 0911 Last data filed at 09/12/15 0657  Gross per 24 hour  Intake    760 ml  Output   1225 ml  Net   -465 ml    LABS: Basic Metabolic Panel:  Recent Labs  09/10/15 0530 09/11/15 0433 09/12/15 0440  NA 134* 137 135  K 4.5 4.8 4.6  CL 110 110 111  CO2 19* 19* 20*  GLUCOSE 105* 96 80  BUN 32* 41* 45*  CREATININE 4.91* 5.03* 5.37*  CALCIUM 7.0* 7.3* 7.2*  MG 1.6*  --   --    Liver Function Tests:  Recent Labs  09/11/15 0433 09/12/15 0440  AST 11* 11*  ALT 6* 7*  ALKPHOS 147* 150*  BILITOT 0.8 1.1  PROT 3.8* 3.9*  ALBUMIN 1.9* 1.8*   No results for input(s): LIPASE, AMYLASE in the last 72 hours. CBC:  Recent Labs  09/11/15 1230 09/12/15 0440  WBC 22.9* 20.5*  NEUTROABS 19.7* 16.3*  HGB 8.8* 8.1*  HCT 26.9* 25.3*  MCV 80.5 80.8  PLT 196 194   Cardiac Enzymes: No results for input(s): CKTOTAL, CKMB, CKMBINDEX, TROPONINI in the last 72 hours. BNP: Invalid input(s): POCBNP D-Dimer: No results for input(s): DDIMER in the last 72 hours. Hemoglobin A1C: No results for input(s): HGBA1C in the last 72 hours. Fasting Lipid Panel: No results for input(s): CHOL, HDL, LDLCALC, TRIG, CHOLHDL, LDLDIRECT in the last 72 hours. Thyroid Function Tests: No results for input(s): TSH, T4TOTAL, T3FREE, THYROIDAB in the last 72 hours.  Invalid input(s): FREET3 Anemia Panel: No results for input(s): VITAMINB12, FOLATE, FERRITIN, TIBC, IRON, RETICCTPCT in the last 72 hours.   PHYSICAL EXAM General: Well developed, well nourished, in no acute distress HEENT:  Normocephalic and atramatic Neck:  No JVD.  Lungs:  Clear bilaterally to auscultation and percussion. Heart: HRRR . Normal S1 and S2 without gallops or murmurs.  Abdomen: Bowel sounds are positive, abdomen soft and non-tender  Msk:  Back normal, normal gait. Normal strength and tone for age. Extremities: No clubbing, cyanosis or edema.   Neuro: Alert and oriented X 3. Psych:  Good affect, responds appropriately  TELEMETRY: NSR  ASSESSMENT AND PLAN: Colan surgery went well, doing well.  Active Problems:   Colonic obstruction ([place])   Peritoneal carcinomatosis ([place])   Colonic mass    Marynell Bies A, MD, [facility] 09/12/2015 9:11 AM

## 2015-09-12 NOTE — Progress Notes (Signed)
[facility] [facility] [place] NAME: Craig Page    MR#:  [id number]  DATE OF BIRTH:  12/10/1945  SUBJECTIVE:  CHIEF COMPLAINT:  No chief complaint on file. denies new issues, creat going up 5.3 REVIEW OF SYSTEMS:  CONSTITUTIONAL: No fever, fatigue or weakness.  EYES: No blurred or double vision.  EARS, NOSE, AND THROAT: No tinnitus or ear pain.  RESPIRATORY: No cough, shortness of breath, wheezing or hemoptysis.  CARDIOVASCULAR: No chest pain, orthopnea, edema.  GASTROINTESTINAL: No nausea, vomiting, diarrhea , but have abdominal pain.  GENITOURINARY: No dysuria, hematuria.  ENDOCRINE: No polyuria, nocturia,  HEMATOLOGY: No anemia, easy bruising or bleeding SKIN: No rash or lesion. MUSCULOSKELETAL: No joint pain or arthritis.   NEUROLOGIC: No tingling, numbness, weakness.  PSYCHIATRY: No anxiety or depression.   ROS  DRUG ALLERGIES:  No Known Allergies  VITALS:  Blood pressure 98/60, pulse 77, temperature 98.5 F (36.9 C), temperature source Oral, resp. rate 18, height 5\' 11"  (1.803 m), weight 74.889 kg (165 lb 1.6 oz), SpO2 92 %.  PHYSICAL EXAMINATION:   GENERAL: Well-nourished, well-developed , currently in no acute distress.  HEAD: Normocephalic, atraumatic.  EYES: Pupils equal, round, and reactive to light. Extraocular muscles intact. No scleral icterus.  MOUTH: Moist mucosal membranes. Dentition intact. No abscess noted. EARS, NOSE, THROAT: Clear without exudates. No external lesions.  NECK: Supple. No thyromegaly. No nodules. No JVD.  PULMONARY: Clear to auscultation bilaterally without wheezes, rales, or rhonchi. No use of accessory muscles. Good respiratory effort. CHEST: Nontender to palpation.  CARDIOVASCULAR: S1, S2, regular rate and rhythm. No murmurs, rubs, or gallops.  GASTROINTESTINAL: Soft, diffusely tender, surgical dressing present. MUSCULOSKELETAL: No swelling, clubbing, edema. Range of motion full in all  extremities. NEUROLOGIC: Cranial nerves II-XII intact. No gross focal neurological deficits. Sensation intact. Reflexes intact. SKIN: No ulcerations, lesions, rash, cyanosis. Skin warm, dry. Turgor intact. PSYCHIATRIC: Mood, affect within normal limits. Patient awake, alert, oriented x 3. Insight and judgment intact.  Physical Exam LABORATORY PANEL:   CBC  Recent Labs Lab 09/12/15 0440  WBC 20.5*  HGB 8.1*  HCT 25.3*  PLT 194   ------------------------------------------------------------------------------------------------------------------  Chemistries   Recent Labs Lab 09/10/15 0530  09/12/15 0440  NA 134*  < > 135  K 4.5  < > 4.6  CL 110  < > 111  CO2 19*  < > 20*  GLUCOSE 105*  < > 80  BUN 32*  < > 45*  CREATININE 4.91*  < > 5.37*  CALCIUM 7.0*  < > 7.2*  MG 1.6*  --   --   AST 12*  < > 11*  ALT 7*  < > 7*  ALKPHOS 191*  < > 150*  BILITOT 0.9  < > 1.1  < > = values in this interval not displayed. ------------------------------------------------------------------------------------------------------------------  Cardiac Enzymes  Recent Labs Lab 09/06/15 1255  TROPONINI <0.03   ------------------------------------------------------------------------------------------------------------------  RADIOLOGY:  No results found.  ASSESSMENT AND PLAN:   Active Problems:   Colonic obstruction (HCC)   Peritoneal carcinomatosis ([place])   Colonic mass  1 coronary artery disease with ischemic cardiomyopathy Patient is asymptomatic continue Coreg with holding parameters  Cardiology on case.  2. Chronic systolic congestive heart failure-currently not fluid overloaded Hold Lasix and spironolactone in view of hypotension Monitor closely for symptoms and signs of fluid overload  3. Hypertension, currently patient is hypotensive Continue his blood pressure medications Coreg and amiodarone with holding parameters Discontinue lisinopril  as the patient is currently  with low blood pressure  4. 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 Labs  5. Acute renal failure probably secondary to ATN Avoid nephrotoxins, provide IV fluids Follow-up with nephrology - creat 5.3  6.Tobacco abuse disorder Consultation to quit smoking for 3-5 minutes. Patient is considering to quit smoking. provide nicotine patch  All the records are reviewed and case discussed with Care Management/Social Workerr. Management plans discussed with the patient, family and they are in agreement.  CODE STATUS: full  TOTAL TIME TAKING CARE OF THIS PATIENT: 25 minutes.     D/C Plans per primary team   [facility], Tamecca Artiga M.D on 09/12/2015   Between 7am to 6pm - Pager - [phone]  After 6pm go to [email or web address] - password [email or web address] [facility]  Office  [phone]  CC: Primary care physician; [name], MD  Note: This dictation was prepared with Dragon dictation along with smaller phrase technology. Any transcriptional errors that result from this process are unintentional.

## 2015-09-13 DIAGNOSIS — R140 Abdominal distension (gaseous): Secondary | ICD-10-CM | POA: Insufficient documentation

## 2015-09-13 LAB — COMPREHENSIVE METABOLIC PANEL WITH GFR
ALT: 6 U/L — ABNORMAL LOW (ref 17–63)
AST: 12 U/L — ABNORMAL LOW (ref 15–41)
Albumin: 2.9 g/dL — ABNORMAL LOW (ref 3.5–5.0)
Alkaline Phosphatase: 160 U/L — ABNORMAL HIGH (ref 38–126)
Anion gap: 6 (ref 5–15)
BUN: 48 mg/dL — ABNORMAL HIGH (ref 6–20)
CO2: 17 mmol/L — ABNORMAL LOW (ref 22–32)
Calcium: 7.4 mg/dL — ABNORMAL LOW (ref 8.9–10.3)
Chloride: 113 mmol/L — ABNORMAL HIGH (ref 101–111)
Creatinine, Ser: 5.46 mg/dL — ABNORMAL HIGH (ref 0.61–1.24)
GFR calc Af Amer: 11 mL/min — ABNORMAL LOW
GFR calc non Af Amer: 10 mL/min — ABNORMAL LOW
Glucose, Bld: 79 mg/dL (ref 65–99)
Potassium: 4.2 mmol/L (ref 3.5–5.1)
Sodium: 136 mmol/L (ref 135–145)
Total Bilirubin: 1.3 mg/dL — ABNORMAL HIGH (ref 0.3–1.2)
Total Protein: 4.5 g/dL — ABNORMAL LOW (ref 6.5–8.1)

## 2015-09-13 LAB — CBC WITH DIFFERENTIAL/PLATELET
Basophils Absolute: 0.1 K/uL (ref 0–0.1)
Basophils Relative: 1 %
Eosinophils Absolute: 1 K/uL — ABNORMAL HIGH (ref 0–0.7)
Eosinophils Relative: 6 %
HCT: 21.9 % — ABNORMAL LOW (ref 40.0–52.0)
Hemoglobin: 7.2 g/dL — ABNORMAL LOW (ref 13.0–18.0)
Lymphocytes Relative: 8 %
Lymphs Abs: 1.5 K/uL (ref 1.0–3.6)
MCH: 26.4 pg (ref 26.0–34.0)
MCHC: 32.6 g/dL (ref 32.0–36.0)
MCV: 81 fL (ref 80.0–100.0)
Monocytes Absolute: 1.2 K/uL — ABNORMAL HIGH (ref 0.2–1.0)
Monocytes Relative: 7 %
Neutro Abs: 14.3 K/uL — ABNORMAL HIGH (ref 1.4–6.5)
Neutrophils Relative %: 78 %
Platelets: 173 K/uL (ref 150–440)
RBC: 2.7 MIL/uL — ABNORMAL LOW (ref 4.40–5.90)
RDW: 23.7 % — ABNORMAL HIGH (ref 11.5–14.5)
WBC: 18.1 K/uL — ABNORMAL HIGH (ref 3.8–10.6)

## 2015-09-13 LAB — PREPARE RBC (CROSSMATCH)

## 2015-09-13 MED ORDER — SODIUM CHLORIDE 0.9 % IV SOLN: Freq: Once | INTRAVENOUS | Status: DC

## 2015-09-13 MED ORDER — SODIUM BICARBONATE 8.4 % IV SOLN
INTRAVENOUS | Status: DC
Start: 2015-09-13 — End: 2015-09-16
  Administered 2015-09-13 – 2015-09-15 (×5): via INTRAVENOUS
  Filled 2015-09-13 (×8): qty 150

## 2015-09-13 MED ORDER — HEPARIN SOD (PORK) LOCK FLUSH 100 UNIT/ML IV SOLN: 500.0000 [IU] | Freq: Every day | INTRAVENOUS | Status: DC | PRN

## 2015-09-13 MED ORDER — SODIUM CHLORIDE 0.9 % IJ SOLN: 10.0000 mL | INTRAMUSCULAR | Status: DC | PRN

## 2015-09-13 MED ORDER — SODIUM CHLORIDE 0.9 % IJ SOLN: 3.0000 mL | INTRAMUSCULAR | Status: DC | PRN

## 2015-09-13 MED ORDER — SODIUM CHLORIDE 0.9 % IV SOLN
250.0000 mL | Freq: Once | INTRAVENOUS | Status: AC
Start: 2015-09-13 — End: 2015-09-13
  Administered 2015-09-13: 09:00:00 250 mL via INTRAVENOUS

## 2015-09-13 MED ORDER — ENSURE ENLIVE PO LIQD
237.0000 mL | Freq: Three times a day (TID) | ORAL | Status: DC
  Administered 2015-09-13 – 2015-09-19 (×14): 237 mL via ORAL

## 2015-09-13 MED ORDER — ENSURE ENLIVE PO LIQD: 237.0000 mL | Freq: Three times a day (TID) | ORAL | Status: DC

## 2015-09-13 MED ORDER — HEPARIN SOD (PORK) LOCK FLUSH 100 UNIT/ML IV SOLN: 250.0000 [IU] | INTRAVENOUS | Status: DC | PRN

## 2015-09-13 NOTE — Clinical Social Work Note (Signed)
Clinical Social Work Assessment  Patient Details  Name: Craig Page MRN: [id number] Date of Birth: [birth year]  Date of referral:  09/13/15               Reason for consult:  Facility Placement                Permission sought to share information with:  Chartered certified accountant granted to share information::  Yes, Verbal Permission Granted  Name::      Craig Page::   Craig Page   Relationship::     Contact Information:     Housing/Transportation Living arrangements for the past 2 months:  [place] of Information:  Patient Patient Interpreter Needed:  None Criminal Activity/Legal Involvement Pertinent to Current Situation/Hospitalization:  No - Comment as needed Significant Relationships:  Adult Children, Spouse Lives with:  Spouse Do you feel safe going back to the place where you live?  Yes Need for family participation in patient care:  Yes (Comment)  Care giving concerns: Patient lives in [place] with his wife Craig Page.    Social Worker assessment / plan: Holiday representative (CSW) received SNF consult. PT is recommending SNF. Per RN Case Manager patient will likely not be ready for D/C until Monday. CSW met with patient to discuss D/C paln. Patient has a new colostomy. Patient was laying in bed and alert and oriented. CSW introduced self and explained role of CSW department. Patient reported that he has a family and lives with his wife in [place]. Patient is agreeable to SNF search in [place]. Patient reported that he is not familiar with the facilities in the area. SNF list was provided.   FL2 complete and faxed out. CSW contacted Craig Page with [facility] and made her aware of above.  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   Patient/Family's Understanding of and Emotional Response to Diagnosis, Current Treatment, and Prognosis: Patient was quiet during assessment and stated that he was very tired. CSW provided emotional support.   Emotional Assessment Appearance:  Appears stated age Attitude/Demeanor/Rapport:    Affect (typically observed):  Accepting, Adaptable, Pleasant Orientation:  Oriented to Self, Oriented to Place, Oriented to  Time, Oriented to Situation Alcohol / Substance use:  Not Applicable Psych involvement (Current and /or in the community):  No (Comment)  Discharge Needs  Concerns to be addressed:  Discharge Planning Concerns Readmission within the last 30 days:  No Current discharge risk:  Dependent with Mobility, Chronically ill Barriers to Discharge:  Continued Medical Work up   Craig [place], LCSW 09/13/2015, 12:54 PM

## 2015-09-13 NOTE — Progress Notes (Signed)
Craig Page   DOB:07/01/1946   RA#:[id number]    CC: Likely metastatic colon cancer status post palliative surgery  Subjective: feels better this AM; however as per RN pt had mild visual hallucination. He feels abdominal pain is better; plans to work with PT this AM.   His appetite is fair. He is on liquid diet.   ROS: No fever no chills. No diarrhea. No cough; no dyspnea. No nausea or vomiting. Objective:  Filed Vitals:   09/13/15 0608  BP:   Pulse:   Temp:   Resp: 18     Intake/Output Summary (Last 24 hours) at 09/13/15 0814 Last data filed at 09/13/15 0556  Gross per 24 hour  Intake   5403 ml  Output   1175 ml  Net   4228 ml    GENERAL:alert, no distress and comfortable; alone. SKIN: skin color, texture, turgor are normal, no rashes or significant lesions EYES: Positive for pallor. OROPHARYNX:no Thrush or ulceration. LYMPH: no palpable lymphadenopathy in the cervical, axillary or inguinal LUNGS: Decreased breath sounds bilaterally.  HEART: regular rate & rhythm and no murmurs and no lower extremity edema ABDOMEN:abdomen soft, mild distention noted mildly tender. Positive for colostomy. No rigidity or guarding. normal bowel sounds Musculoskeletal:no cyanosis of digits and no clubbing  NEURO: alert & oriented x 3 with fluent speech, no focal motor/sensory deficits  Labs:  Lab Results  Component Value Date   WBC 18.1* 09/13/2015   HGB 7.2* 09/13/2015   HCT 21.9* 09/13/2015   MCV 81.0 09/13/2015   PLT 173 09/13/2015   NEUTROABS 14.3* 09/13/2015    Lab Results  Component Value Date   NA 136 09/13/2015   K 4.2 09/13/2015   CL 113* 09/13/2015   CO2 17* 09/13/2015    Studies:  No results found.  Assessment & Plan:  # Clinical concern for primary colonic malignancy with omental/liver metastases; CEA- 16; pathology pending. Status post palliative surgery POD #3  # prior Hx of Follicular lymphoma s/p treatment.   # Acute renal failure creatinine up to 5.46  today/ however patient making urine-nonoliguric. Appreciate the recommendations of nephrology.  # leukocytosis-likely reactive- on Zosyn given recent surgery/bowel perforation.  # Anemia- hemoglobin 7.2- Recommend  1 unit of PRBC transfusion.   # Hx of cardiomyopathy- currently stable/appreciate cardiology in put.    # Ambulation/physical therapy-attempted yesterday; but held because of pain; Will again attempt today. Discussed with RN.  Cammie Sickle, MD 09/13/2015  8:14 AM

## 2015-09-13 NOTE — Progress Notes (Signed)
   SUBJECTIVE: Pt sitting up in bed attempting to eat breakfast. C/o abdominal pain with movement, no CP or SOB.    Filed Vitals:   09/12/15 1413 09/12/15 2044 09/13/15 0542 09/13/15 0608  BP: 98/60 108/62 127/64   Pulse: 77 92 100   Temp: 98.5 F (36.9 C) 98.2 F (36.8 C) 99.3 F (37.4 C)   TempSrc: Oral Oral Axillary   Resp:  17 18 18   Height:      Weight:      SpO2: 92% 94% 88% 95%    Intake/Output Summary (Last 24 hours) at 09/13/15 0904 Last data filed at 09/13/15 0556  Gross per 24 hour  Intake   5403 ml  Output   1175 ml  Net   4228 ml    LABS: Basic Metabolic Panel:  Recent Labs  09/12/15 0440 09/13/15 0604  NA 135 136  K 4.6 4.2  CL 111 113*  CO2 20* 17*  GLUCOSE 80 79  BUN 45* 48*  CREATININE 5.37* 5.46*  CALCIUM 7.2* 7.4*   Liver Function Tests:  Recent Labs  09/12/15 0440 09/13/15 0604  AST 11* 12*  ALT 7* 6*  ALKPHOS 150* 160*  BILITOT 1.1 1.3*  PROT 3.9* 4.5*  ALBUMIN 1.8* 2.9*   No results for input(s): LIPASE, AMYLASE in the last 72 hours. CBC:  Recent Labs  09/12/15 0440 09/13/15 0604  WBC 20.5* 18.1*  NEUTROABS 16.3* 14.3*  HGB 8.1* 7.2*  HCT 25.3* 21.9*  MCV 80.8 81.0  PLT 194 173   Cardiac Enzymes: No results for input(s): CKTOTAL, CKMB, CKMBINDEX, TROPONINI in the last 72 hours. BNP: Invalid input(s): POCBNP D-Dimer: No results for input(s): DDIMER in the last 72 hours. Hemoglobin A1C: No results for input(s): HGBA1C in the last 72 hours. Fasting Lipid Panel: No results for input(s): CHOL, HDL, LDLCALC, TRIG, CHOLHDL, LDLDIRECT in the last 72 hours. Thyroid Function Tests: No results for input(s): TSH, T4TOTAL, T3FREE, THYROIDAB in the last 72 hours.  Invalid input(s): FREET3 Anemia Panel: No results for input(s): VITAMINB12, FOLATE, FERRITIN, TIBC, IRON, RETICCTPCT in the last 72 hours.   PHYSICAL EXAM General: Well developed, well nourished, in no acute distress HEENT:  Normocephalic and atramatic Neck:   No JVD.  Lungs: Clear bilaterally to auscultation and percussion. Heart: HRRR . Normal S1 and S2 without gallops or murmurs.  Abdomen: Bowel sounds are positive, ostomy present  Msk:  Back normal, normal gait. Normal strength and tone for age. Extremities: No clubbing, cyanosis or edema.   Neuro: Alert and oriented X 3. Psych:  Good affect, responds appropriately  TELEMETRY: Reviewed telemetry pt in NSR  ASSESSMENT AND PLAN: Pt stable from cardiac stand point.    Patient and plan discussed with supervising provider, Dr. Neoma Laming, who agrees with above findings.   Kelby Fam Mirella Gueye, [place]  09/13/2015 9:04 AM

## 2015-09-13 NOTE — Progress Notes (Signed)
Palliative Medicine Inpatient Consult Follow Up Note   Name: Craig Page Date: 09/13/2015 MRN: [id number]  DOB: March 03, 1946  Referring Physician: Forest Gleason, MD  Palliative Care consult requested for this 69 y.o. male for goals of medical therapy in patient with what is thought to be metastatic colon cancer. Patient has a history of stage IIIA Follicular NHL admitted via the oncology clinic with a perforated colon. He completed maintenance chemo for lymphoma in Oct 2012. A PET scan in March 2015 showed complete remission. Then, another PET scan prior to heart surgery in Nov 2015, was reportedly negative. He had an eval by cardiologist recently and note was made of low hemoglobin and high WBC, so he was referred to Dr. Oliva Bustard on 08/27/15. BP was low and clinical concern was for recurrence of lymphoma so another PET scan was scheduled. PEt on 08/29/15 showed LUQ mass with central stool and necrotic debgris at splenic flexure. This was suspicious for perforated colon cancer. There were hypermetabolic pertioneal soft tissue nodules in the abdomen and pelvis with minimal ascites c/w metastatic disease. He has never had a colonoscopy. He has lost 40 lbs in the last year. He has not seen melena or hematochezia.    IMPRESSION: 1. Colon Mass at splenic flexure ----now post op day 1 from Laparoscopy biopsy of peritoneal nodules and transverse colon with colostomy ----apparently, the mass was left where it was since it may involve the spleen. 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 Acute renal failure due to ATN due to dehydration most likely ---Cr 5.03 today (was 4.91 yesterday) ---Nephrology consulted and IV fluids are ordered (decreased due to third spacing) ---may need temporary HD 3. Anemia likely iron deficient from chronic blood loss due to colon malignancy ---there is blood in ostomy bag Hgb  8.8 ()but has CHF so may need better HGB) 4. Leukocytosis - WBC 23 K on ABX for perforated colon ---Bld cxs neg and urine cx neg x 2 days 5. Metabolic Acidosis due to poor perfusion  6. Hypomagnesemia 7. Severe Malnutrition (Albumin is 1.9 and wt loss for 1 yr of 40 lbs) 8. Cardiomyopathy ---likely ischemic in nature given h/o CAD ---last echo in record from 10/20/15 shows EF of only 30 to 35% with no SMAs ---pt had outpt echo on 9/30 --need results but one note says EF =43% 9. Pulmonary Diffusion Defect as seen in PFTs done 10/17/15 10. AAA size was 3.7 x 4.2 cm in Nov 2015 11. Mild emphysema (imaging diagnosis on CT Nov 11/15) 12. Atherosclerosis and Peripheral Artery Disease 13. H/O right Frontal Infarct as seen on Ct Nov 2015. 46. CAD with h/o CABG x3 in Nov 2015 and h/o MI in 1992 and also in Nov 2015 (followed by Dr Humphrey Rolls) 55. Carotid Artery Stenosis  -----R = 100% and L is moderate (per post CABG follow up note from 11/01/15 )  16. Atrial Fibrillation ----on amiodarone at home 200mg  daily but no anticoagulant apparently  17, Hyperlipidemia 18. H/O 3 packyear tobacco smoking (quit recently) 19. Hypotension   TODAY'S DISCUSSIONS AND DECISIONS: I attempted to address CODE STATUS  again today.  I got conflicting responses but he did at least recall that I had been over this two days ago.  He is a bit confused and it is hard to get a clear answer from him beyond 'lets do it' or similar responses which applied to CPR and also to DNR. We have to  leave him as Full Code status until we can confirm that he is more oriented and rational --at which time, I suspect he will still request Full Code.     REVIEW OF SYSTEMS:  Patient is not able to provide ROS due to confusion following pain pill dose  CODE STATUS: Full code  -- CONTINUES AS FULL CODE PAST MEDICAL HISTORY: Past Medical History  Diagnosis Date  . CAD (coronary artery disease)     3v  . Ischemic  cardiomyopathy   . Chronic systolic heart failure ([place])   . HTN (hypertension)   . HLD (hyperlipidemia)   . Follicular lymphoma ([place])   . Myocardial infarction [facility]) 1992    treated with thrombolytics/notes 10/16/2014    PAST SURGICAL HISTORY:  Past Surgical History  Procedure Laterality Date  . Hernia repair    . Umbilical hernia repair  1964  . Coronary artery bypass graft N/A 10/19/2014    Procedure: CORONARY ARTERY BYPASS GRAFTING (CABG);  Surgeon: Ivin Poot, MD;  Location: [place];  Service: Open Heart Surgery;  Laterality: N/A;  Times 3 using left internal mammary artery and endoscopically harvested left saphenous vein  . Intraoperative transesophageal echocardiogram N/A 10/19/2014    Procedure: INTRAOPERATIVE TRANSESOPHAGEAL ECHOCARDIOGRAM;  Surgeon: Ivin Poot, MD;  Location: [place];  Service: Open Heart Surgery;  Laterality: N/A;  . Laparoscopy N/A 09/10/2015    Procedure: LAPAROSCOPY DIAGNOSTIC;  Surgeon: Christene Lye, MD;  Location: [facility];  Service: General;  Laterality: N/A;  . Laparotomy N/A 09/10/2015    Procedure: EXPLORATORY LAPAROTOMY;  Surgeon: Christene Lye, MD;  Location: [facility];  Service: General;  Laterality: N/A;  . Transverse loop colostomy Right 09/10/2015    Procedure: TRANSVERSE LOOP COLOSTOMY;  Surgeon: Christene Lye, MD;  Location: [facility];  Service: General;  Laterality: Right;    Vital Signs: BP 111/54 mmHg  Pulse 77  Temp(Src) 97.8 F (36.6 C) (Oral)  Resp 20  Ht 5\' 11"  (1.803 m)  Wt 74.889 kg (165 lb 1.6 oz)  BMI 23.04 kg/m2  SpO2 93% Filed Weights   09/09/15 1402  Weight: 74.889 kg (165 lb 1.6 oz)    Estimated body mass index is 23.04 kg/(m^2) as calculated from the following:   Height as of this encounter: 5\' 11"  (1.803 m).   Weight as of this encounter: 74.889 kg (165 lb 1.6 oz).  PHYSICAL EXAM: Awake but sleepy looking EOMI  OP clear Nares patent Neck w/o JVD or TM Hrt rrr no mgr Lungs no  rales  Patient answered at one point that he did not want [place] to try to bring him back from the dead. Then, when I confirmed what he wanted, he said the opposite. I let him know he told me two opposite things about code status and he said he did not know and I should come back another time because he is a little bit (here he drew circles in the air-- indicating he wasn't thinking clearly).   LABS: CBC:    Component Value Date/Time   WBC 18.1* 09/13/2015 0604   WBC 10.4 10/16/2014 0458   HGB 7.2* 09/13/2015 0604   HGB 12.1* 10/16/2014 0458   HCT 21.9* 09/13/2015 0604   HCT 36.4* 10/16/2014 0458   PLT 173 09/13/2015 0604   PLT 186 10/16/2014 0458   MCV 81.0 09/13/2015 0604   MCV 85 10/16/2014 0458   NEUTROABS 14.3* 09/13/2015 0604   NEUTROABS 6.3 10/16/2014 0458   LYMPHSABS 1.5  09/13/2015 0604   LYMPHSABS 2.7 10/16/2014 0458   MONOABS 1.2* 09/13/2015 0604   MONOABS 1.1* 10/16/2014 0458   EOSABS 1.0* 09/13/2015 0604   EOSABS 0.4 10/16/2014 0458   BASOSABS 0.1 09/13/2015 0604   BASOSABS 0.1 10/16/2014 0458   Comprehensive Metabolic Panel:    Component Value Date/Time   NA 136 09/13/2015 0604   NA 139 10/16/2014 0458   K 4.2 09/13/2015 0604   K 4.4 10/16/2014 0458   CL 113* 09/13/2015 0604   CL 105 10/16/2014 0458   CO2 17* 09/13/2015 0604   CO2 26 10/16/2014 0458   BUN 48* 09/13/2015 0604   BUN 22* 10/16/2014 0458   CREATININE 5.46* 09/13/2015 0604   CREATININE 1.32* 10/16/2014 0458   GLUCOSE 79 09/13/2015 0604   GLUCOSE 87 10/16/2014 0458   CALCIUM 7.4* 09/13/2015 0604   CALCIUM 8.4* 10/16/2014 0458   AST 12* 09/13/2015 0604   AST 8* 10/15/2014 1210   ALT 6* 09/13/2015 0604   ALT 19 10/15/2014 1210   ALKPHOS 160* 09/13/2015 0604   ALKPHOS 88 10/15/2014 1210   BILITOT 1.3* 09/13/2015 0604   BILITOT 0.4 10/15/2014 1210   PROT 4.5* 09/13/2015 0604   PROT 6.4 10/15/2014 1210   ALBUMIN 2.9* 09/13/2015 0604   ALBUMIN 3.3* 10/15/2014 1210    Time Spent:  15 min

## 2015-09-13 NOTE — Progress Notes (Signed)
Pt alert and oriented, transfusing one unit of blood at this time. Foley remains intact and pt put out 500cc of yellow urine. Up to chair x1 and worked with PT, they recommend rehab for pt. Colostomy intact. VSS. Complaints of pain to the abd region x2 and was relieved by pain medications. Code status was talked with pt, but he wants to remain a full code while in hospital.

## 2015-09-13 NOTE — Progress Notes (Signed)
ANTIBIOTIC CONSULT NOTE - Follow up  Pharmacy Consult for Zosyn Indication: intra-abdominal infection  No Known Allergies  Patient Measurements: Height: 5\' 11"  (180.3 cm) Weight: 165 lb 1.6 oz (74.889 kg) IBW/kg (Calculated) : 75.3   Vital Signs: Temp: 97.8 F (36.6 C) (10/07 1506) Temp Source: Oral (10/07 1454) BP: 111/54 mmHg (10/07 1506) Pulse Rate: 77 (10/07 1506)   Labs:  Recent Labs  09/11/15 0433 09/11/15 1230 09/12/15 0440 09/13/15 0604  WBC 18.5* 22.9* 20.5* 18.1*  HGB 8.3* 8.8* 8.1* 7.2*  PLT 193 196 194 173  CREATININE 5.03*  --  5.37* 5.46*   Estimated Creatinine Clearance: 13.7 mL/min (by C-G formula based on Cr of 5.46). No results for input(s): VANCOTROUGH, VANCOPEAK, VANCORANDOM, GENTTROUGH, GENTPEAK, GENTRANDOM, TOBRATROUGH, TOBRAPEAK, TOBRARND, AMIKACINPEAK, AMIKACINTROU, AMIKACIN in the last 72 hours.   Microbiology: Recent Results (from the past 720 hour(s))  Culture, blood (routine x 2)     Status: None (Preliminary result)   Collection Time: 09/09/15  2:59 PM  Result Value Ref Range Status   Specimen Description BLOOD RIGHT ASSIST CONTROL  Final   Special Requests AER 10ML ANA 10ML  Final   Culture NO GROWTH 4 DAYS  Final   Report Status PENDING  Incomplete  Culture, blood (routine x 2)     Status: None (Preliminary result)   Collection Time: 09/09/15  3:38 PM  Result Value Ref Range Status   Specimen Description BLOOD RIGHT ARM  Final   Special Requests AER 10ML ANA 10ML  Final   Culture NO GROWTH 4 DAYS  Final   Report Status PENDING  Incomplete  Urine culture     Status: None   Collection Time: 09/09/15  6:34 PM  Result Value Ref Range Status   Specimen Description URINE, CLEAN CATCH  Final   Special Requests NONE  Final   Culture NO GROWTH 1 DAY  Final   Report Status 09/11/2015 FINAL  Final    Assessment: Pharmacy consulted to dose zosyn for intra-abdominal infection in this 69 year old male. AKI with SCr of 5.46 (up from ~1.0  in September), estCrCl: 14 ml/min  Plan:  Will continue zosyn 3.375gm IV Q12H as indicated for CrCl < 7ml/min.  Pharmacy to follow per consult.   Rexene Edison, PharmD Clinical Pharmacist  09/13/2015 3:17 PM

## 2015-09-13 NOTE — Progress Notes (Signed)
Pt received 1 unit of blood, tolerated well. Vitals stable after transfusion.

## 2015-09-13 NOTE — Progress Notes (Signed)
[facility]  ROUNDING NOTE   Subjective:   Patient laying in bed. No specific complaints.  Creatinine 5.46 (5.37) (5.03) (4.91) UOP 1175 (1225)  Hemoglobin dropped to 7.2 - PRBC transfusion ordered.   Objective:  Vital signs in last 24 hours:  Temp:  [98.2 F (36.8 C)-99.3 F (37.4 C)] 99.3 F (37.4 C) (10/07 0542) Pulse Rate:  [77-100] 100 (10/07 0542) Resp:  [17-18] 18 (10/07 0608) BP: (98-127)/(60-64) 127/64 mmHg (10/07 0542) SpO2:  [88 %-95 %] 95 % (10/07 0938)  Weight change:  Filed Weights   09/09/15 1402  Weight: 74.889 kg (165 lb 1.6 oz)    Intake/Output: I/O last 3 completed shifts: In: 1829 [P.O.:1230; I.V.:3828; IV Piggyback:345] Out: 2400 [Urine:2400]   Intake/Output this shift:  Total I/O In: 120 [P.O.:120] Out: -   Physical Exam: General: NAD  Head: Normocephalic, atraumatic. Dry oral mucosal membranes  Eyes: Anicteric, PERRL  Neck: Supple, trachea midline  Lungs:  Clear to auscultation  Heart: Regular rate and rhythm  Abdomen:  Soft, nontender, +right upper quadrant ostomy with some blood in bag  Extremities:  trace peripheral edema.  Neurologic: Nonfocal, moving all four extremities  Skin: No lesions  GU foley    Basic Metabolic Panel:  Recent Labs Lab 09/09/15 1125 09/10/15 0530 09/11/15 0433 09/12/15 0440 09/13/15 0604  NA 130* 134* 137 135 136  K 3.8 4.5 4.8 4.6 4.2  CL 100* 110 110 111 113*  CO2 19* 19* 19* 20* 17*  GLUCOSE 159* 105* 96 80 79  BUN 32* 32* 41* 45* 48*  CREATININE 4.88* 4.91* 5.03* 5.37* 5.46*  CALCIUM 7.0* 7.0* 7.3* 7.2* 7.4*  MG  --  1.6*  --   --   --     Liver Function Tests:  Recent Labs Lab 09/09/15 1125 09/10/15 0530 09/11/15 0433 09/12/15 0440 09/13/15 0604  AST 15 12* 11* 11* 12*  ALT 7* 7* 6* 7* 6*  ALKPHOS 187* 191* 147* 150* 160*  BILITOT 0.9 0.9 0.8 1.1 1.3*  PROT 4.9* 4.1* 3.8* 3.9* 4.5*  ALBUMIN 2.3* 2.1* 1.9* 1.8* 2.9*    Recent Labs Lab 09/06/15 1255  LIPASE 11*    No results for input(s): AMMONIA in the last 168 hours.  CBC:  Recent Labs Lab 09/09/15 1125 09/10/15 0530 09/11/15 0433 09/11/15 1230 09/12/15 0440 09/13/15 0604  WBC 19.3* 21.9* 18.5* 22.9* 20.5* 18.1*  NEUTROABS 16.0*  --  15.4* 19.7* 16.3* 14.3*  HGB 9.9* 9.1* 8.3* 8.8* 8.1* 7.2*  HCT 31.1* 28.5* 25.9* 26.9* 25.3* 21.9*  MCV 80.0 79.3* 79.9* 80.5 80.8 81.0  PLT 203 200 193 196 194 173    Cardiac Enzymes:  Recent Labs Lab 09/06/15 1255  TROPONINI <0.03    BNP: Invalid input(s): POCBNP  CBG: No results for input(s): GLUCAP in the last 168 hours.  Microbiology: Results for orders placed or performed during the hospital encounter of 09/09/15  Culture, blood (routine x 2)     Status: None (Preliminary result)   Collection Time: 09/09/15  2:59 PM  Result Value Ref Range Status   Specimen Description BLOOD RIGHT ASSIST CONTROL  Final   Special Requests AER 10ML ANA 10ML  Final   Culture NO GROWTH 4 DAYS  Final   Report Status PENDING  Incomplete  Culture, blood (routine x 2)     Status: None (Preliminary result)   Collection Time: 09/09/15  3:38 PM  Result Value Ref Range Status   Specimen Description BLOOD RIGHT ARM  Final   Special Requests AER 10ML ANA 10ML  Final   Culture NO GROWTH 4 DAYS  Final   Report Status PENDING  Incomplete  Urine culture     Status: None   Collection Time: 09/09/15  6:34 PM  Result Value Ref Range Status   Specimen Description URINE, CLEAN CATCH  Final   Special Requests NONE  Final   Culture NO GROWTH 1 DAY  Final   Report Status 09/11/2015 FINAL  Final    Coagulation Studies: No results for input(s): LABPROT, INR in the last 72 hours.  Urinalysis: No results for input(s): COLORURINE, LABSPEC, PHURINE, GLUCOSEU, HGBUR, BILIRUBINUR, KETONESUR, PROTEINUR, UROBILINOGEN, NITRITE, LEUKOCYTESUR in the last 72 hours.  Invalid input(s): APPERANCEUR    Imaging: No results found.   Medications:   .  sodium bicarbonate   infusion 1000 mL 75 mL/hr at 09/13/15 1048   . sodium chloride  250 mL Intravenous Once  . sodium chloride   Intravenous Once  . albumin human  25 g Intravenous Q6H  . amiodarone  200 mg Oral Daily  . famotidine  20 mg Oral Daily  . feeding supplement (ENSURE ENLIVE)  237 mL Oral TID WC  . heparin subcutaneous  5,000 Units Subcutaneous Q12H  . pantoprazole  40 mg Oral Daily  . piperacillin-tazobactam (ZOSYN)  IV  3.375 g Intravenous Q12H  . sodium chloride  10-40 mL Intracatheter Q12H   acetaminophen **OR** acetaminophen, heparin lock flush, heparin lock flush, morphine injection, ondansetron (ZOFRAN) IVPB [facility] +/- dexamethasone, ondansetron, oxyCODONE, simethicone, sodium chloride, sodium chloride, sodium chloride  Assessment/ Plan:  [name] is a 69 y.o.white male with coronary artery disease, congestive heart failure, hypertension, hyperlipidemia, follicular lymphoma who was admitted to [facility] on 09/09/2015 for acute renal failure. Colonic obstruction   1. Acute Renal Failure: most likely ATN. Urine output nonoliguric and improving. However creatinine seems to be plateauring.  - Continue IV fluids - discontinue normal saline due to metabolic acidosis. Start sodium bicarbonate infusion at 75 - albumin IV 25mg  q6 x 6 doses - monitor serum electrolytes and urine output. No acute indication for dialysis at this time. Low threshold for temporary dialysis. Discussed with patient. Appreciate Palliative care.   2. Hypertension: well controlled. - discontinued carvedilol.   3. Anemia: with persistent leukocytosis. Anemia is long standing. However now trending downward and has blood in ostomy bag. PRBC transfusion for today.   4. Chronic Systolic Congestive Heart Failure: not acute exacerbation. Monitor volume status. EF 35-40% on echo from 10/2014. Currently holding diuretics.     LOS: [place], [place] 10/7/201611:01 AM

## 2015-09-13 NOTE — Evaluation (Signed)
Physical Therapy Evaluation Patient Details Name: Craig Page MRN: [id number] DOB: 13-Oct-1946 Today's Date: 09/13/2015   History of Present Illness  Pt is a 69 yo male admitted to the hospital s/p colostomy and biopsy   Clinical Impression  Pt presents with hx of CAD, ischemic cardiomyopathy, chronic systolic heart failure, HTN, HLD. Examination reveals that pt performs bed mobility at min assist, transfers at min assist, and ambulation of 5 ft at min assist. He shows LE weakness, especially with functional tasks such as standing and walking. At this point pt is a high fall risk given his balance and activity tolerance deficits. Because of this he will continue to benefit from skilled PT in order to eventually return home safely.     Follow Up Recommendations SNF    Equipment Recommendations  Rolling walker with 5" wheels    Recommendations for Other Services       Precautions / Restrictions Restrictions Weight Bearing Restrictions: No Other Position/Activity Restrictions: Ostomy bag  (gait belt placement)      Mobility  Bed Mobility Overal bed mobility: Needs Assistance Bed Mobility: Sit to Supine       Sit to supine: Min assist   General bed mobility comments: Pt needs assist with LEs getting back into supine. Pt able to position himself midline in bed  Transfers Overall transfer level: Needs assistance Equipment used: Rolling walker (2 wheeled) Transfers: Sit to/from Stand Sit to Stand: Min assist         General transfer comment: Pt needs assist to get into standing as well as cues for hand placement prior to transfer. In standing pt reports being slightly lightheaded but not so much so that he can't continue.   Ambulation/Gait Ambulation/Gait assistance: Min assist Ambulation Distance (Feet): 5 Feet Assistive device: Rolling walker (2 wheeled) Gait Pattern/deviations: Step-to pattern;Decreased step length - left;Decreased step length - right;Decreased stride  length;Staggering left;Staggering right Gait velocity: severely decreased Gait velocity interpretation: <1.8 ft/sec, indicative of risk for recurrent falls General Gait Details: Pt unsteady with ambulation with staggers bilaterally. Needs assist to correct minor LOBs. Pt fatigued after 5 ft ambulation   Stairs            Wheelchair Mobility    Modified Rankin (Stroke Patients Only)       Balance Overall balance assessment: Needs assistance Sitting-balance support: Bilateral upper extremity supported Sitting balance-Leahy Scale: Good     Standing balance support: Bilateral upper extremity supported Standing balance-Leahy Scale: Fair Standing balance comment: lateral sway bilaterally                              Pertinent Vitals/Pain Pain Assessment: 0-10 Pain Score: 5  Pain Location: abdomen (with mobility ) Pain Intervention(s): Limited activity within patient's tolerance;Monitored during session;Premedicated before session    Home Living Family/patient expects to be discharged to:: Private residence Living Arrangements: Spouse/significant other Available Help at Discharge: Available 24 hours/day (Lives with wife and grandaughter ) Type of Home: Mobile home Home Access: Stairs to enter Entrance Stairs-Rails: Can reach both Entrance Stairs-Number of Steps: 3 Home Layout: One level Home Equipment: None      Prior Function Level of Independence: Independent         Comments: Pt was working at [employer], ambulating and performing ADLs indep     Hand Dominance        Extremity/Trunk Assessment   Upper Extremity Assessment: Overall WFL for tasks  assessed           Lower Extremity Assessment: Generalized weakness (Fair MMT, but poor functional strength with mobility )         Communication   Communication: No difficulties  Cognition Arousal/Alertness: Awake/alert Behavior During Therapy: WFL for tasks  assessed/performed Overall Cognitive Status: Within Functional Limits for tasks assessed                      General Comments      Exercises Other Exercises Other Exercises: Pt performed bilateral therex x 10 reps at supervision for proper technique. Exercises included: scap squeeze, press up from chair, LAQ, SLR, hip abd, and ankle pumps      Assessment/Plan    PT Assessment Patient needs continued PT services  PT Diagnosis Difficulty walking;Abnormality of gait;Generalized weakness;Acute pain   PT Problem List Decreased strength;Decreased activity tolerance;Decreased balance;Decreased mobility;Decreased safety awareness;Pain  PT Treatment Interventions     PT Goals (Current goals can be found in the Care Plan section) Acute Rehab PT Goals Patient Stated Goal: to return home PT Goal Formulation: With patient Time For Goal Achievement: 09/27/15 Potential to Achieve Goals: Good    Frequency Min 2X/week   Barriers to discharge        Co-evaluation               End of Session Equipment Utilized During Treatment: Gait belt Activity Tolerance: Patient tolerated treatment well Patient left: in bed;with call bell/phone within reach;with bed alarm set Nurse Communication: Mobility status         Time: 3343-5686 PT Time Calculation (min) (ACUTE ONLY): 23 min   Charges:         PT G CodesJanyth Contes 09-21-2015, 11:03 AM  Janyth Contes, SPT. [phone]

## 2015-09-13 NOTE — Progress Notes (Signed)
[place] [facility] [place] NAME: Craig Page    MR#:  [id number]  DATE OF BIRTH:  March 13, 1946  SUBJECTIVE:  CHIEF COMPLAINT:  No chief complaint on file. denies new issues, creat going up 5.3->5.46 REVIEW OF SYSTEMS:  Review of Systems  Constitutional: Negative for fever, weight loss, malaise/fatigue and diaphoresis.  HENT: Negative for ear discharge, ear pain, hearing loss, nosebleeds, sore throat and tinnitus.   Eyes: Negative for blurred vision and pain.  Respiratory: Negative for cough, hemoptysis, shortness of breath and wheezing.   Cardiovascular: Negative for chest pain, palpitations, orthopnea and leg swelling.  Gastrointestinal: Negative for heartburn, nausea, vomiting, abdominal pain, diarrhea, constipation and blood in stool.  Genitourinary: Negative for dysuria, urgency and frequency.  Musculoskeletal: Negative for myalgias and back pain.  Skin: Negative for itching and rash.  Neurological: Negative for dizziness, tingling, tremors, focal weakness, seizures, weakness and headaches.  Psychiatric/Behavioral: Negative for depression. The patient is not nervous/anxious.    DRUG ALLERGIES:  No Known Allergies VITALS:  Blood pressure 127/64, pulse 100, temperature 99.3 F (37.4 C), temperature source Axillary, resp. rate 18, height 5\' 11"  (1.803 m), weight 74.889 kg (165 lb 1.6 oz), SpO2 95 %. PHYSICAL EXAMINATION:   Physical Exam  Constitutional: He is oriented to person, place, and time and well-developed, well-nourished, and in no distress.  HENT:  Head: Normocephalic and atraumatic.  Eyes: Conjunctivae and EOM are normal. Pupils are equal, round, and reactive to light.  Neck: Normal range of motion. Neck supple. No tracheal deviation present. No thyromegaly present.  Cardiovascular: Normal rate, regular rhythm and normal heart sounds.   Pulmonary/Chest: Effort normal and breath sounds normal. No respiratory distress. He has no  wheezes. He exhibits no tenderness.  Abdominal: Soft. Bowel sounds are normal. He exhibits no distension. There is no tenderness.  Musculoskeletal: Normal range of motion.  Neurological: He is alert and oriented to person, place, and time. No cranial nerve deficit.  Skin: Skin is warm and dry. No rash noted.  Psychiatric: Mood and affect normal.   LABORATORY PANEL:   CBC  Recent Labs Lab 09/13/15 0604  WBC 18.1*  HGB 7.2*  HCT 21.9*  PLT 173   ------------------------------------------------------------------------------------------------------------------  Chemistries   Recent Labs Lab 09/10/15 0530  09/13/15 0604  NA 134*  < > 136  K 4.5  < > 4.2  CL 110  < > 113*  CO2 19*  < > 17*  GLUCOSE 105*  < > 79  BUN 32*  < > 48*  CREATININE 4.91*  < > 5.46*  CALCIUM 7.0*  < > 7.4*  MG 1.6*  --   --   AST 12*  < > 12*  ALT 7*  < > 6*  ALKPHOS 191*  < > 160*  BILITOT 0.9  < > 1.3*  < > = values in this interval not displayed. ------------------------------------------------------------------------------------------------------------------  Cardiac Enzymes No results for input(s): TROPONINI in the last 168 hours. ------------------------------------------------------------------------------------------------------------------  RADIOLOGY:  No results found.  ASSESSMENT AND PLAN:   Active Problems:   Colonic obstruction (HCC)   Peritoneal carcinomatosis ([place])   Colonic mass  1 coronary artery disease with ischemic cardiomyopathy Patient is asymptomatic continue Coreg with holding parameters  Cardiology on case.  2. Chronic systolic congestive heart failure-currently not fluid overloaded Hold Lasix and spironolactone in view of hypotension Monitor closely for symptoms and signs of fluid overload  3. Hypertension, currently patient is hypotensive Continue his blood pressure  medications Coreg and amiodarone with holding parameters Discontinue lisinopril as the  patient is currently with low blood pressure  4. 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 Acute renal failure probably secondary to ATN Avoid nephrotoxins, provide IV fluids Nephrology following - creat still worsening 5.3->5.4  6. Anemia: likely acute on chronic dz: no obvious bleeding. Hb 7.2 this am, consider transfusion if Hb < 7  Tobacco abuse disorder Consultation to quit smoking for 3-5 minutes. Patient is considering to quit smoking. provide nicotine patch  All the records are reviewed and case discussed with Care Management/Social Workerr. Management plans discussed with the patient, family and they are in agreement.  CODE STATUS: full  TOTAL TIME TAKING CARE OF THIS PATIENT: 15 minutes.     D/C Plans per primary team   [facility], Martin Smeal M.D on 09/13/2015   Between 7am to 6pm - Pager - [phone]  After 6pm go to [email or web address] - password [email or web address] [facility]  Office  [phone]  CC: Primary care physician; [name], MD  Note: This dictation was prepared with Dragon dictation along with smaller phrase technology. Any transcriptional errors that result from this process are unintentional.

## 2015-09-13 NOTE — Plan of Care (Signed)
Problem: Discharge Progression Outcomes Goal: Other Discharge Outcomes/Goals Plan of care progress to goal: - Complained of pain, morphine IV given with improvement. - Foley in place. - IV fluids continued. Will continue to monitor.

## 2015-09-13 NOTE — Progress Notes (Signed)
Pt worked with PT today he got out of bed to chair, pt is very weak and has unsteady gait. Hemoglobin 7.2, pt will get a unit of blood today. Sodium bicarb started, Dr Abigail Butts does not want foley removed until tomorrow to monitor I/O. Pain x1 to stomach, morphine given with some relief.

## 2015-09-13 NOTE — Progress Notes (Signed)
Patient ID: Craig Page, male   DOB: 11/16/1946, 69 y.o.   MRN: [id number] Pt looks better, seems to be a bit stronger overall-still weak with regard to activity. No n/v. Colostomy functioning. Abdomen is soft with good bowel sounds. LUQ tenderness has markedly diminished. He is still making over 1014ml of urine daily. Creat still up at 5.4. Discussed with nephrology. Will d/c foley today. Advance diet Antibiotics- continue zosyn for today and may switch to Augmentin or Cipro/Flagyl tomorrow. Path- still pending. There is a possibility the peritoneal nodes are of hepatoid origin. Does not appear colonic at present. I will see pt again on Monday. Dr. Bary Castilla covering the weekend and will be available prn

## 2015-09-13 NOTE — Progress Notes (Signed)
Initial Nutrition Assessment   INTERVENTION:   Meals and Snacks: Cater to patient preferences as pt diet order advanced to Soft this am after breakfast Medical Food Supplement Therapy: will recommend Ensure Enlive po TID, each supplement provides 350 kcal and 20 grams of protein. Pt likes chocolate flavor.  Education: will educate on colostomy nutrition therapy on follow-up.   NUTRITION DIAGNOSIS:   Inadequate oral intake related to inability to eat as evidenced by NPO status, improved with diet advancement.   GOAL:   Patient will meet greater than or equal to 90% of their needs; ongoing  MONITOR:    (Energy Intake, Electrolyte and renal Profile, Digestive System, Anthropometrics)   ASSESSMENT:   POD2 lap colon resection. Pt reports abdominal pain on visit. Per MD note pt with colon cancer primary with mets to lymph nodes and liver.   Diet Order:  DIET SOFT Room service appropriate?: Yes; Fluid consistency:: Thin    Current Nutrition: Pt reports having coffee with cream of wheat this am. Could not remember if he ate much of yogurt. Recorded 25% of breakfast eaten. Pt reports not really wanting lunch secondary to abdominal pain. Per Nsg note, pt's appetite has been down since surgery.   Gastrointestinal Profile: abdomen distended, colostomy in place Last BM: 09/12/2015   Medications: human albumin, protonix, Nabicarbonate with D5 at 73mL/hr (providing 306kcals in 24 hours)  Electrolyte/Renal Profile and Glucose Profile:   Recent Labs Lab 09/10/15 0530 09/11/15 0433 09/12/15 0440 09/13/15 0604  NA 134* 137 135 136  K 4.5 4.8 4.6 4.2  CL 110 110 111 113*  CO2 19* 19* 20* 17*  BUN 32* 41* 45* 48*  CREATININE 4.91* 5.03* 5.37* 5.46*  CALCIUM 7.0* 7.3* 7.2* 7.4*  MG 1.6*  --   --   --   GLUCOSE 105* 96 80 79   Protein Profile:  Recent Labs Lab 09/11/15 0433 09/12/15 0440 09/13/15 0604  ALBUMIN 1.9* 1.8* 2.9*     Weight Trend since Admission: Filed Weights    09/09/15 1402  Weight: 165 lb 1.6 oz (74.889 kg)     Skin:  Reviewed, no issues   BMI:  Body mass index is 23.04 kg/(m^2).  Estimated Nutritional Needs:   Kcal:  BEE: 1536kcals, TEE: (IF 1.1-1.3)(AF 1.2) [id number]  Protein:  75-82g protein (1.0-1.2g/kg)  Fluid:  1873-2236mL of fluid (25-55mL/kg)  EDUCATION NEEDS:   Education needs no appropriate at this time   [place], RD, LDN Pager [phone]

## 2015-09-13 NOTE — Clinical Social Work Placement (Signed)
   CLINICAL SOCIAL WORK PLACEMENT  NOTE  Date:  09/13/2015  Patient Details  Name: Craig Page MRN: [id number] Date of Birth: 02-18-1946  Clinical Social Work is seeking post-discharge placement for this patient at the [place] level of care (*CSW will initial, date and re-position this form in  chart as items are completed):  Yes   Patient/family provided with [facility] Work Department's list of facilities offering this level of care within the geographic area requested by the patient (or if unable, by the patient's family).  Yes   Patient/family informed of their freedom to choose among providers that offer the needed level of care, that participate in Medicare, Medicaid or managed care program needed by the patient, have an available bed and are willing to accept the patient.  Yes   Patient/family informed of [facility]'s ownership interest in [facility] and [facility], as well as of the fact that they are under no obligation to receive care at these facilities.  PASRR submitted to EDS on 09/13/15     PASRR number received on 09/13/15     Existing PASRR number confirmed on       FL2 transmitted to all facilities in geographic area requested by pt/family on 09/13/15     FL2 transmitted to all facilities within larger geographic area on       Patient informed that his/her managed care company has contracts with or will negotiate with certain facilities, including the following:            Patient/family informed of bed offers received.  Patient chooses bed at       Physician recommends and patient chooses bed at      Patient to be transferred to   on  .  Patient to be transferred to facility by       Patient family notified on   of transfer.  Name of family member notified:        PHYSICIAN Please sign FL2     Additional Comment:    _______________________________________________ Loralyn Freshwater, LCSW 09/13/2015, 12:53  PM

## 2015-09-13 NOTE — Plan of Care (Signed)
Problem: Discharge Progression Outcomes Goal: Activity appropriate for discharge plan Outcome: Progressing Pt worked with pt today they recommend rehab, pt made aware of facilities that have bed's open. Pt was OOB to chair x1 and walked with physical therapy, pt is weak in BLE. One unit of blood being given for a hemoglobin of 7.2, tolerating well. Appetite fair, ate 50% of all meals and ensure. Vitals stable, 02 not needed at this time. Possible discharge to facility this weekend or Monday.

## 2015-09-14 ENCOUNTER — Inpatient Hospital Stay: Payer: Commercial Managed Care - HMO

## 2015-09-14 LAB — CBC WITH DIFFERENTIAL/PLATELET
Basophils Absolute: 0.2 10*3/uL — ABNORMAL HIGH (ref 0–0.1)
Basophils Relative: 1 %
Eosinophils Absolute: 1.3 10*3/uL — ABNORMAL HIGH (ref 0–0.7)
HEMATOCRIT: 28.3 % — AB (ref 40.0–52.0)
Hemoglobin: 9.1 g/dL — ABNORMAL LOW (ref 13.0–18.0)
Lymphocytes Relative: 9 %
Lymphs Abs: 1.8 10*3/uL (ref 1.0–3.6)
MCH: 26.2 pg (ref 26.0–34.0)
MCHC: 32 g/dL (ref 32.0–36.0)
MCV: 81.7 fL (ref 80.0–100.0)
MONO ABS: 1.3 10*3/uL — AB (ref 0.2–1.0)
Monocytes Relative: 6 %
Neutro Abs: 16.1 10*3/uL — ABNORMAL HIGH (ref 1.4–6.5)
PLATELETS: 171 10*3/uL (ref 150–440)
RBC: 3.47 MIL/uL — ABNORMAL LOW (ref 4.40–5.90)
RDW: 23.2 % — AB (ref 11.5–14.5)
WBC: 20.6 10*3/uL — AB (ref 3.8–10.6)

## 2015-09-14 LAB — CULTURE, BLOOD (ROUTINE X 2)
CULTURE: NO GROWTH
CULTURE: NO GROWTH

## 2015-09-14 LAB — TYPE AND SCREEN
ABO/RH(D): O POS
Antibody Screen: NEGATIVE
Unit division: 0

## 2015-09-14 LAB — RENAL FUNCTION PANEL
ANION GAP: 10 (ref 5–15)
Albumin: 2.7 g/dL — ABNORMAL LOW (ref 3.5–5.0)
BUN: 48 mg/dL — ABNORMAL HIGH (ref 6–20)
CALCIUM: 7.7 mg/dL — AB (ref 8.9–10.3)
CO2: 22 mmol/L (ref 22–32)
CREATININE: 5.38 mg/dL — AB (ref 0.61–1.24)
Chloride: 105 mmol/L (ref 101–111)
GFR calc Af Amer: 11 mL/min — ABNORMAL LOW (ref >60)
GFR, EST NON AFRICAN AMERICAN: 10 mL/min — AB (ref >60)
Glucose, Bld: 134 mg/dL — ABNORMAL HIGH (ref 65–99)
Phosphorus: 4.7 mg/dL — ABNORMAL HIGH (ref 2.5–4.6)
Potassium: 3.7 mmol/L (ref 3.5–5.1)
SODIUM: 137 mmol/L (ref 135–145)

## 2015-09-14 NOTE — Plan of Care (Signed)
Problem: Discharge Progression Outcomes Goal: Other Discharge Outcomes/Goals Outcome: Progressing Plan of care progress to goals:  Discharge plan- pt is from home, PT recommends rehab at discharge.  Activity appropriate for discharge- pt needs assistance with activity. Encourage pt OOB and offer assistance.   Colostomy noted with beefy red stoma and dark loose stool present, foley catheter draining clear yellow urine, TLC to left IJ infusing IVF per MD order, pt refusing teds and scd's, VSS, no c/o pain this shift.

## 2015-09-14 NOTE — Progress Notes (Signed)
[place] at [place] NAME: Craig Page    MR#:  [id number]  DATE OF BIRTH:  Nov 20, 1946  SUBJECTIVE:  CHIEF COMPLAINT:  No chief complaint on file.  Complains of some pain in his abdomen on movement. Nausea. No vomiting. Overall feels weak.  REVIEW OF SYSTEMS:  Review of Systems  Constitutional: Positive for malaise/fatigue. Negative for fever, chills, weight loss and diaphoresis.  HENT: Negative for ear discharge and sore throat.   Eyes: Negative for blurred vision, double vision and pain.  Respiratory: Positive for cough. Negative for hemoptysis, sputum production, shortness of breath and wheezing.   Cardiovascular: Negative for chest pain, palpitations, orthopnea and leg swelling.  Gastrointestinal: Positive for nausea and abdominal pain. Negative for heartburn, vomiting and blood in stool.  Genitourinary: Negative for dysuria, urgency, frequency and hematuria.  Musculoskeletal: Negative for myalgias, back pain, joint pain and falls.  Skin: Negative for itching and rash.  Neurological: Positive for weakness. Negative for dizziness and focal weakness.  Psychiatric/Behavioral: Positive for memory loss. The patient is not nervous/anxious.    DRUG ALLERGIES:  No Known Allergies VITALS:  Blood pressure 120/55, pulse 88, temperature 98.8 F (37.1 C), temperature source Oral, resp. rate 20, height 5\' 11"  (1.803 m), weight 74.889 kg (165 lb 1.6 oz), SpO2 92 %. PHYSICAL EXAMINATION:   Physical Exam  Constitutional: He is oriented to person, place, and time and well-developed, well-nourished, and in no distress.  HENT:  Head: Normocephalic and atraumatic.  Eyes: Conjunctivae and EOM are normal. Pupils are equal, round, and reactive to light.  Neck: Normal range of motion. Neck supple. No tracheal deviation present. No thyromegaly present.  Cardiovascular: Normal rate, regular rhythm and normal heart sounds.   Pulmonary/Chest: Effort  normal and breath sounds normal. No respiratory distress. He has no wheezes. He exhibits no tenderness.  Decreased breath sounds at bases  Abdominal: Soft. He exhibits distension. Bowel sounds are hypoactive. There is tenderness.  Colostomy bag  Genitourinary:  Foley in place.  Musculoskeletal: Normal range of motion.  Neurological: He is alert and oriented to person, place, and time. No cranial nerve deficit.  Skin: Skin is warm and dry. No rash noted.  Psychiatric: Mood and affect normal.   LABORATORY PANEL:   CBC  Recent Labs Lab 09/13/15 0604  WBC 18.1*  HGB 7.2*  HCT 21.9*  PLT 173   ------------------------------------------------------------------------------------------------------------------  Chemistries   Recent Labs Lab 09/10/15 0530  09/13/15 0604  NA 134*  < > 136  K 4.5  < > 4.2  CL 110  < > 113*  CO2 19*  < > 17*  GLUCOSE 105*  < > 79  BUN 32*  < > 48*  CREATININE 4.91*  < > 5.46*  CALCIUM 7.0*  < > 7.4*  MG 1.6*  --   --   AST 12*  < > 12*  ALT 7*  < > 6*  ALKPHOS 191*  < > 160*  BILITOT 0.9  < > 1.3*  < > = values in this interval not displayed. ------------------------------------------------------------------------------------------------------------------  Cardiac Enzymes No results for input(s): TROPONINI in the last 168 hours. ------------------------------------------------------------------------------------------------------------------  RADIOLOGY:  No results found.  ASSESSMENT AND PLAN:   Active Problems:   Colonic obstruction (HCC)   Peritoneal carcinomatosis (HCC)   Colonic mass   Abdominal distension  * Acute hypoxic respiratory failure Saturations 87% on room air earlier. Patient is now on 1-2 L oxygen. Possible causes are atelectasis versus  CHF. We'll check a chest x-ray. Incentive spirometer.  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 Advised this will be discussed by his oncologist.  * Acute renal failure probably secondary to ATN Avoid nephrotoxins Nephrology following - creat slowly worsening. Labs from today are pending.  * Anemia- anemia of chronic disease and acute blood loss anemia status post surgery. 1 unit packed RBC transfused on 09/13/2015.  * Chronic systolic congestive heart failure Hold Lasix and spironolactone in view of hypotension and ARF Monitor closely for symptoms and signs of fluid overload  * Hypertension, currently patient is hypotensive BP meds held due to hypotension  * Coronary artery disease with ischemic cardiomyopathy Patient is asymptomatic continue Coreg with holding parameters  Cardiology on case.  *Tobacco abuse disorder Consultation to quit smoking this admission  All the records are reviewed and case discussed with Care Management/Social Workerr. Management plans discussed with the patient, family and they are in agreement.  CODE STATUS: full  TOTAL TIME TAKING CARE OF THIS PATIENT: 15 minutes.   D/C Plans per primary team   Neita Carp M.D on 09/14/2015   Between 7am to 6pm - Pager - [phone]  After 6pm go to [email or web address] - password [email or web address] [facility]  Office  [phone]  CC: Primary care physician; [name], MD  Note: This dictation was prepared with Dragon dictation along with smaller phrase technology. Any transcriptional errors that result from this process are unintentional.

## 2015-09-14 NOTE — Plan of Care (Signed)
Problem: Discharge Progression Outcomes Goal: Other Discharge Outcomes/Goals Outcome: Progressing Shift from 0700-0300. VSS. C/o LUQ pain once, morphine given with improvement. Fair appetite. Foley intact and draining. Small amt of dark brown loose stool via colostomy.

## 2015-09-14 NOTE — Progress Notes (Signed)
ONCOLOGY PROGRESS NOTE -   HISTORY OF PRESENT ILLNESS: Still weak, but states that he overall feels better after getting blood transfusion yesterday. Denies obvious bleeding symptoms. Continues to have intermittent abdominal pain since recent surgery but feels pain medication is controlling fairly well. Denies fever or chills. No headaches. Occasional nausea, no vomiting today. He is interested in ambulation.  ROS:  As in history of present illness above. In addition no fever no chills. Denies any headaches or focal weakness. No sore throat or dysphagia. No dyspnea at rest, chest pain or hemoptysis. Intermittent abdominal pain since surgery. No diarrhea. No obvious bleeding symptoms. No new paresthesias in extremities. No polyuria or polydipsia.                  PHYSICAL EXAM: VITALS: 98.5, 100, 18, 118/83, 94% GENERAL: Patient is weak-looking, resting in bed, otherwise alert and oriented, in no acute distress. No icterus.  HEENT: Extraocular motions intact. No oral thrush. CVS: S1 and S2, regular LUNGS: Decreased breath sounds bilaterally, no rhonchi.  ABDOMEN:abdomen soft, mild distention noted mildly tender. Normal bowel sounds EXTREMITIES: No major edema or cyanosis  Labs:     Potassium 3.7, creatinine 5.38, BUN 48, calcium 7.7, WBC 20.6, hemoglobin 9.1, platelets 171, 78% neutrophils.   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 Status post palliative surgery. Patient still weak, states that he is beginning to feel better, pain is under better control.  # Acute renal failure creatinine up to 5.38. However patient making urine-nonoliguric. Appreciate the recommendations of nephrology.  # Leukocytosis-likely reactive, currently afebrile. Continue current treatment.  # Anemia- hemoglobin better at 9.1, got transfusion yesterday.   # Ambulation/physical therapy- reviewed with the  patient is motivated. I also reviewed with the nurse  Above plan of care was discussed with the patient, he is agreeable to this.

## 2015-09-14 NOTE — Progress Notes (Signed)
[facility]  ROUNDING NOTE   Subjective:   Patient states his pain is well controlled.  UOP 1500 (1175) Creatinine   5.38 (5.46) Objective:  Vital signs in last 24 hours:  Temp:  [97.6 F (36.4 C)-98.8 F (37.1 C)] 97.7 F (36.5 C) (10/08 0919) Pulse Rate:  [50-89] 50 (10/08 0919) Resp:  [15-20] 18 (10/08 0919) BP: (106-125)/(54-73) 119/73 mmHg (10/08 0919) SpO2:  [88 %-95 %] 92 % (10/08 0919)  Weight change:  Filed Weights   09/09/15 1402  Weight: 74.889 kg (165 lb 1.6 oz)    Intake/Output: I/O last 3 completed shifts: In: 6599.3 [P.O.:240; I.V.:5664.3; Blood:350; IV Piggyback:345] Out: 2225 [Urine:2225]   Intake/Output this shift:  Total I/O In: 360 [P.O.:360] Out: -   Physical Exam: General: NAD  Head: Normocephalic, atraumatic. Dry oral mucosal membranes  Eyes: Anicteric, PERRL  Neck: Supple, trachea midline  Lungs:  Clear to auscultation  Heart: Regular rate and rhythm  Abdomen:  Soft, nontender, +right upper quadrant ostomy with some blood in bag  Extremities:  no peripheral edema.  Neurologic: Nonfocal, moving all four extremities  Skin: No lesions  GU foley    Basic Metabolic Panel:  Recent Labs Lab 09/10/15 0530 09/11/15 0433 09/12/15 0440 09/13/15 0604 09/14/15 0927  NA 134* 137 135 136 137  K 4.5 4.8 4.6 4.2 3.7  CL 110 110 111 113* 105  CO2 19* 19* 20* 17* 22  GLUCOSE 105* 96 80 79 134*  BUN 32* 41* 45* 48* 48*  CREATININE 4.91* 5.03* 5.37* 5.46* 5.38*  CALCIUM 7.0* 7.3* 7.2* 7.4* 7.7*  MG 1.6*  --   --   --   --   PHOS  --   --   --   --  4.7*    Liver Function Tests:  Recent Labs Lab 09/09/15 1125 09/10/15 0530 09/11/15 0433 09/12/15 0440 09/13/15 0604 09/14/15 0927  AST 15 12* 11* 11* 12*  --   ALT 7* 7* 6* 7* 6*  --   ALKPHOS 187* 191* 147* 150* 160*  --   BILITOT 0.9 0.9 0.8 1.1 1.3*  --   PROT 4.9* 4.1* 3.8* 3.9* 4.5*  --   ALBUMIN 2.3* 2.1* 1.9* 1.8* 2.9* 2.7*   No results for input(s): LIPASE,  AMYLASE in the last 168 hours. No results for input(s): AMMONIA in the last 168 hours.  CBC:  Recent Labs Lab 09/09/15 1125 09/10/15 0530 09/11/15 0433 09/11/15 1230 09/12/15 0440 09/13/15 0604  WBC 19.3* 21.9* 18.5* 22.9* 20.5* 18.1*  NEUTROABS 16.0*  --  15.4* 19.7* 16.3* 14.3*  HGB 9.9* 9.1* 8.3* 8.8* 8.1* 7.2*  HCT 31.1* 28.5* 25.9* 26.9* 25.3* 21.9*  MCV 80.0 79.3* 79.9* 80.5 80.8 81.0  PLT 203 200 193 196 194 173    Cardiac Enzymes: No results for input(s): CKTOTAL, CKMB, CKMBINDEX, TROPONINI in the last 168 hours.  BNP: Invalid input(s): POCBNP  CBG: No results for input(s): GLUCAP in the last 168 hours.  Microbiology: Results for orders placed or performed during the hospital encounter of 09/09/15  Culture, blood (routine x 2)     Status: None (Preliminary result)   Collection Time: 09/09/15  2:59 PM  Result Value Ref Range Status   Specimen Description BLOOD RIGHT ASSIST CONTROL  Final   Special Requests AER 10ML ANA 10ML  Final   Culture NO GROWTH 4 DAYS  Final   Report Status PENDING  Incomplete  Culture, blood (routine x 2)     Status:  None (Preliminary result)   Collection Time: 09/09/15  3:38 PM  Result Value Ref Range Status   Specimen Description BLOOD RIGHT ARM  Final   Special Requests AER 10ML ANA 10ML  Final   Culture NO GROWTH 4 DAYS  Final   Report Status PENDING  Incomplete  Urine culture     Status: None   Collection Time: 09/09/15  6:34 PM  Result Value Ref Range Status   Specimen Description URINE, CLEAN CATCH  Final   Special Requests NONE  Final   Culture NO GROWTH 1 DAY  Final   Report Status 09/11/2015 FINAL  Final    Coagulation Studies: No results for input(s): LABPROT, INR in the last 72 hours.  Urinalysis: No results for input(s): COLORURINE, LABSPEC, PHURINE, GLUCOSEU, HGBUR, BILIRUBINUR, KETONESUR, PROTEINUR, UROBILINOGEN, NITRITE, LEUKOCYTESUR in the last 72 hours.  Invalid input(s): APPERANCEUR    Imaging: No  results found.   Medications:   .  sodium bicarbonate  infusion 1000 mL 75 mL/hr at 09/14/15 0015   . amiodarone  200 mg Oral Daily  . famotidine  20 mg Oral Daily  . feeding supplement (ENSURE ENLIVE)  237 mL Oral TID WC  . pantoprazole  40 mg Oral Daily  . piperacillin-tazobactam (ZOSYN)  IV  3.375 g Intravenous Q12H  . sodium chloride  10-40 mL Intracatheter Q12H   acetaminophen **OR** acetaminophen, heparin lock flush, heparin lock flush, morphine injection, ondansetron (ZOFRAN) IVPB [facility] +/- dexamethasone, ondansetron, oxyCODONE, simethicone, sodium chloride, sodium chloride, sodium chloride  Assessment/ Plan:  [name] is a 69 y.o.white male with coronary artery disease, congestive heart failure, hypertension, hyperlipidemia, follicular lymphoma who was admitted to [facility] on 09/09/2015 for acute renal failure. Colonic obstruction   1. Acute Renal Failure: most likely ATN. Urine output nonoliguric and improving. However creatinine seems to be plateaued - Continue IV fluids  sodium bicarbonate infusion at 75 - status post albumin IV 25mg  q6 x 6 doses - monitor serum electrolytes and urine output. No acute indication for dialysis at this time. Low threshold for temporary dialysis. Discussed with patient. Appreciate Palliative care.   2. Hypertension: well controlled. - holding home dose of carvedilol.   3. Anemia: with persistent leukocytosis. Anemia is long standing. Status post PRBC transfusion 10/7  4. Chronic Systolic Congestive Heart Failure: not acute exacerbation. Monitor volume status. EF 35-40% on echo from 10/2014. Currently holding diuretics.     LOS: 5 Ranson Belluomini 10/8/201610:31 AM

## 2015-09-14 NOTE — Progress Notes (Signed)
Clinical Education officer, museum (CSW) met with patient and presented bed offers. Patient adamantly refused SNF. Patient reported that he is going home. Patient reported that when CSW was discussing rehab options patient thought it was outpatient PT. CSW thoroughly explained SNF process and that PT is recommending SNF. Patient continued to refuse SNF and reported that he is going home. RN Case Manager aware of above. CSW will continue to follow and assist as needed.   Blima Rich, [place] [phone]

## 2015-09-14 NOTE — Progress Notes (Signed)
SUBJECTIVE: Patient is feeling much better   Filed Vitals:   09/14/15 0545 09/14/15 0635 09/14/15 0636 09/14/15 0919  BP: 120/55   119/73  Pulse: 85  88 50  Temp: 98.8 F (37.1 C)   97.7 F (36.5 C)  TempSrc: Oral   Oral  Resp: 20   18  Height:      Weight:      SpO2: 90% 88% 92% 92%    Intake/Output Summary (Last 24 hours) at 09/14/15 0958 Last data filed at 09/14/15 0938  Gross per 24 hour  Intake 2666.25 ml  Output   1500 ml  Net 1166.25 ml    LABS: Basic Metabolic Panel:  Recent Labs  09/12/15 0440 09/13/15 0604  NA 135 136  K 4.6 4.2  CL 111 113*  CO2 20* 17*  GLUCOSE 80 79  BUN 45* 48*  CREATININE 5.37* 5.46*  CALCIUM 7.2* 7.4*   Liver Function Tests:  Recent Labs  09/12/15 0440 09/13/15 0604  AST 11* 12*  ALT 7* 6*  ALKPHOS 150* 160*  BILITOT 1.1 1.3*  PROT 3.9* 4.5*  ALBUMIN 1.8* 2.9*   No results for input(s): LIPASE, AMYLASE in the last 72 hours. CBC:  Recent Labs  09/12/15 0440 09/13/15 0604  WBC 20.5* 18.1*  NEUTROABS 16.3* 14.3*  HGB 8.1* 7.2*  HCT 25.3* 21.9*  MCV 80.8 81.0  PLT 194 173   Cardiac Enzymes: No results for input(s): CKTOTAL, CKMB, CKMBINDEX, TROPONINI in the last 72 hours. BNP: Invalid input(s): POCBNP D-Dimer: No results for input(s): DDIMER in the last 72 hours. Hemoglobin A1C: No results for input(s): HGBA1C in the last 72 hours. Fasting Lipid Panel: No results for input(s): CHOL, HDL, LDLCALC, TRIG, CHOLHDL, LDLDIRECT in the last 72 hours. Thyroid Function Tests: No results for input(s): TSH, T4TOTAL, T3FREE, THYROIDAB in the last 72 hours.  Invalid input(s): FREET3 Anemia Panel: No results for input(s): VITAMINB12, FOLATE, FERRITIN, TIBC, IRON, RETICCTPCT in the last 72 hours.   PHYSICAL EXAM General: Well developed, well nourished, in no acute distress HEENT:  Normocephalic and atramatic Neck:  No JVD.  Lungs: Clear bilaterally to auscultation and percussion. Heart: HRRR . Normal S1 and S2  without gallops or murmurs.  Abdomen: Bowel sounds are positive, abdomen soft and non-tender  Msk:  Back normal, normal gait. Normal strength and tone for age. Extremities: No clubbing, cyanosis or edema.   Neuro: Alert and oriented X 3. Psych:  Good affect, responds appropriately    ASSESSMENT AND PLAN: Ischemic cardiomyopathy with LVEF of 42% on echocardiogram done 2 weeks ago in the office. Patient is recovering from surgery nicely.  Active Problems:   Colonic obstruction (HCC)   Peritoneal carcinomatosis ([place])   Colonic mass   Abdominal distension    Neoma Laming A, MD, [facility] 09/14/2015 9:58 AM

## 2015-09-15 LAB — BASIC METABOLIC PANEL
Anion gap: 11 (ref 5–15)
BUN: 47 mg/dL — AB (ref 6–20)
CALCIUM: 7.7 mg/dL — AB (ref 8.9–10.3)
CO2: 27 mmol/L (ref 22–32)
Chloride: 102 mmol/L (ref 101–111)
Creatinine, Ser: 5.17 mg/dL — ABNORMAL HIGH (ref 0.61–1.24)
GFR calc Af Amer: 12 mL/min — ABNORMAL LOW (ref >60)
GFR, EST NON AFRICAN AMERICAN: 10 mL/min — AB (ref >60)
GLUCOSE: 101 mg/dL — AB (ref 65–99)
Potassium: 3.6 mmol/L (ref 3.5–5.1)
Sodium: 140 mmol/L (ref 135–145)

## 2015-09-15 NOTE — Progress Notes (Signed)
SUBJECTIVE: Patient is feeling much better, eating and tolerating.   Filed Vitals:   09/14/15 0919 09/14/15 1303 09/14/15 2109 09/15/15 0522  BP: 119/73 118/83 120/74 115/60  Pulse: 50 100 80 97  Temp: 97.7 F (36.5 C) 98.5 F (36.9 C) 98.6 F (37 C) 98.8 F (37.1 C)  TempSrc: Oral Oral Oral Oral  Resp: 18  18 20   Height:      Weight:      SpO2: 92% 94% 93% 90%    Intake/Output Summary (Last 24 hours) at 09/15/15 0818 Last data filed at 09/15/15 0540  Gross per 24 hour  Intake 2140.5 ml  Output   2400 ml  Net -259.5 ml    LABS: Basic Metabolic Panel:  Recent Labs  09/14/15 0927 09/15/15 0524  NA 137 140  K 3.7 3.6  CL 105 102  CO2 22 27  GLUCOSE 134* 101*  BUN 48* 47*  CREATININE 5.38* 5.17*  CALCIUM 7.7* 7.7*  PHOS 4.7*  --    Liver Function Tests:  Recent Labs  09/13/15 0604 09/14/15 0927  AST 12*  --   ALT 6*  --   ALKPHOS 160*  --   BILITOT 1.3*  --   PROT 4.5*  --   ALBUMIN 2.9* 2.7*   No results for input(s): LIPASE, AMYLASE in the last 72 hours. CBC:  Recent Labs  09/13/15 0604 09/14/15 0927  WBC 18.1* 20.6*  NEUTROABS 14.3* 16.1*  HGB 7.2* 9.1*  HCT 21.9* 28.3*  MCV 81.0 81.7  PLT 173 171   Cardiac Enzymes: No results for input(s): CKTOTAL, CKMB, CKMBINDEX, TROPONINI in the last 72 hours. BNP: Invalid input(s): POCBNP D-Dimer: No results for input(s): DDIMER in the last 72 hours. Hemoglobin A1C: No results for input(s): HGBA1C in the last 72 hours. Fasting Lipid Panel: No results for input(s): CHOL, HDL, LDLCALC, TRIG, CHOLHDL, LDLDIRECT in the last 72 hours. Thyroid Function Tests: No results for input(s): TSH, T4TOTAL, T3FREE, THYROIDAB in the last 72 hours.  Invalid input(s): FREET3 Anemia Panel: No results for input(s): VITAMINB12, FOLATE, FERRITIN, TIBC, IRON, RETICCTPCT in the last 72 hours.   PHYSICAL EXAM General: Well developed, well nourished, in no acute distress HEENT:  Normocephalic and atramatic Neck:   No JVD.  Lungs: Clear bilaterally to auscultation and percussion. Heart: HRRR . Normal S1 and S2 without gallops or murmurs.  Abdomen: Bowel sounds are positive, abdomen soft and non-tender  Msk:  Back normal, normal gait. Normal strength and tone for age. Extremities: No clubbing, cyanosis or edema.   Neuro: Alert and oriented X 3. Psych:  Good affect, responds appropriately  TELEMETRY: Not on monitor  ASSESSMENT AND PLAN: Mild LV dysfunction with left ventricle ejection fraction 42% status post colon surgery, doing very well. Going home with follow-up in the office in one week.  Active Problems:   Colonic obstruction (HCC)   Peritoneal carcinomatosis ([place])   Colonic mass   Abdominal distension    Neoma Laming A, MD, [facility] 09/15/2015 8:18 AM

## 2015-09-15 NOTE — Plan of Care (Addendum)
Problem: Discharge Progression Outcomes Goal: Other Discharge Outcomes/Goals Outcome: Not Progressing Patient alert and oriented, vss, abdomen remains distended and tender, pain medication once today, Patient has been up on side of bed once, continues to have foley with amber colored urine, colostomy output dark brownish green liquid stool stoma pink and beefy.  PT recommends rehab Patient wanting to go home.  Will continue to monitor.

## 2015-09-15 NOTE — Progress Notes (Signed)
ONCOLOGY PROGRESS NOTE -  HISTORY OF PRESENT ILLNESS: Denies any new complaints, abdominal pain is under control today. States that he is beginning to drink more boost. Denies nausea or vomiting today. No diarrhea. Generalized weakness is unchanged. Denies obvious bleeding symptoms. Denies fever or chills. No headaches.   ROS:  As in history of present illness above. In addition no fevers or chills. Denies any headaches or focal weakness. No sore throat or dysphagia. No dyspnea at rest, chest pain or hemoptysis. No obvious bleeding symptoms. No new paresthesias in extremities. No polyuria or polydipsia.                  PHYSICAL EXAM: VITALS: 98.4, 82, 20, 107/70, 97% GENERAL: Patient is resting in bed, otherwise alert and oriented, in no acute distress. No icterus.  HEENT: Extraocular motions intact. No oral thrush. CVS: S1S2, regular LUNGS: Decreased breath sounds bilaterally, no rhonchi.  ABDOMEN:abdomen soft, mild distention noted mildly tender. Normal bowel sounds EXTREMITIES: No major edema    Labs:     Potassium 3.6, creatinine 5.17, BUN 47, calcium 7.7. 10/8 - WBC 20.6, hemoglobin 9.1, platelets 171, 78% neutrophils.   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 Status post palliative surgery. Patient still weak, states that he is beginning to feel better, pain is under better control. Oncology will continue to follow and make further treatment planning once final reports are available.  # Acute renal failure creatinine slightly better at 5.17 - continue management per nephrology recommendations.  # Leukocytosis-likely reactive, currently afebrile. Continue current treatment.  # Anemia- hemoglobin better at 9.1, got transfusion 10/7. Repeat CBC tomorrow.

## 2015-09-15 NOTE — Progress Notes (Signed)
[place] [place] NAME: Craig Page    MR#:  [id number]  DATE OF BIRTH:  Jul 24, 1946  SUBJECTIVE:  CHIEF COMPLAINT:  No chief complaint on file.  Complains of some pain in his abdomen on movement. Nausea. No vomiting. Overall feels weak. He feels is slowly getting better. Urine output 2200 ML  REVIEW OF SYSTEMS:  Review of Systems  Constitutional: Positive for malaise/fatigue. Negative for fever, chills, weight loss and diaphoresis.  HENT: Negative for ear discharge and sore throat.   Eyes: Negative for blurred vision, double vision and pain.  Respiratory: Positive for cough. Negative for hemoptysis, sputum production, shortness of breath and wheezing.   Cardiovascular: Negative for chest pain, palpitations, orthopnea and leg swelling.  Gastrointestinal: Positive for nausea and abdominal pain. Negative for heartburn, vomiting and blood in stool.  Genitourinary: Negative for dysuria, urgency, frequency and hematuria.  Musculoskeletal: Negative for myalgias, back pain, joint pain and falls.  Skin: Negative for itching and rash.  Neurological: Positive for weakness. Negative for dizziness and focal weakness.  Psychiatric/Behavioral: Positive for memory loss. The patient is not nervous/anxious.    DRUG ALLERGIES:  No Known Allergies VITALS:  Blood pressure 115/60, pulse 97, temperature 98.8 F (37.1 C), temperature source Oral, resp. rate 20, height 5\' 11"  (1.803 m), weight 74.889 kg (165 lb 1.6 oz), SpO2 90 %. PHYSICAL EXAMINATION:   Physical Exam  Constitutional: He is oriented to person, place, and time and well-developed, well-nourished, and in no distress.  HENT:  Head: Normocephalic and atraumatic.  Eyes: Conjunctivae and EOM are normal. Pupils are equal, round, and reactive to light.  Neck: Normal range of motion. Neck supple. No tracheal deviation present. No thyromegaly present.  Cardiovascular: Normal rate, regular  rhythm and normal heart sounds.   Pulmonary/Chest: Effort normal and breath sounds normal. No respiratory distress. He has no wheezes. He exhibits no tenderness.  Decreased breath sounds at bases  Abdominal: Soft. He exhibits distension. Bowel sounds are hypoactive. There is tenderness.  Colostomy bag  Genitourinary:  Foley in place.  Musculoskeletal: Normal range of motion.  Neurological: He is alert and oriented to person, place, and time. No cranial nerve deficit.  Skin: Skin is warm and dry. No rash noted.  Psychiatric: Mood and affect normal.   LABORATORY PANEL:   CBC  Recent Labs Lab 09/14/15 0927  WBC 20.6*  HGB 9.1*  HCT 28.3*  PLT 171   ------------------------------------------------------------------------------------------------------------------  Chemistries   Recent Labs Lab 09/10/15 0530  09/13/15 0604  09/15/15 0524  NA 134*  < > 136  < > 140  K 4.5  < > 4.2  < > 3.6  CL 110  < > 113*  < > 102  CO2 19*  < > 17*  < > 27  GLUCOSE 105*  < > 79  < > 101*  BUN 32*  < > 48*  < > 47*  CREATININE 4.91*  < > 5.46*  < > 5.17*  CALCIUM 7.0*  < > 7.4*  < > 7.7*  MG 1.6*  --   --   --   --   AST 12*  < > 12*  --   --   ALT 7*  < > 6*  --   --   ALKPHOS 191*  < > 160*  --   --   BILITOT 0.9  < > 1.3*  --   --   < > = values in  this interval not displayed. ------------------------------------------------------------------------------------------------------------------  Cardiac Enzymes No results for input(s): TROPONINI in the last 168 hours. ------------------------------------------------------------------------------------------------------------------  RADIOLOGY:  Dg Chest 2 View  09/14/2015   CLINICAL DATA:  Hypoxia.  Lymphoma.  EXAM: CHEST  2 VIEW  COMPARISON:  12/03/2014 chest radiograph.  FINDINGS: Median sternotomy wires are aligned and intact. Left internal jugular central venous catheter terminates in the upper third of the superior vena cava. Lung  volumes are low. The cardiomediastinal silhouette is likely stable accounting for portable technique, lordotic positioning and low lung volumes, with mild cardiomegaly. No pneumothorax. Small left pleural effusion. No right pleural effusion. There is mild pulmonary edema. Hazy left lung base opacities likely represent atelectasis.  IMPRESSION: 1. Mild congestive heart failure. 2. Small left pleural effusion. 3. Hazy left basilar lung opacities, likely atelectasis.   Electronically Signed   By: Ilona Sorrel M.D.   On: 09/14/2015 12:10    ASSESSMENT AND PLAN:   Active Problems:   Colonic obstruction (HCC)   Peritoneal carcinomatosis (HCC)   Colonic mass   Abdominal distension  * Acute hypoxic respiratory failure Saturations 87% on room air earlier. Patient is now on 1-2 L oxygen. Mild CHF on CXR Lower fluids.  * History of follicular lymphoma with the splenic flexure perforation postop day #2 status post exploratory laparotomy and colon resection Pain management and postop care per surgery Continue IV antibiotic Zosyn  * Acute renal failure probably secondary to ATN - No significant improvement Avoid nephrotoxins Nephrology following    * Anemia- anemia of chronic disease and acute blood loss anemia status post surgery. 1 unit packed RBC transfused on 09/13/2015.  * Chronic systolic congestive heart failure Held Lasix and spironolactone in view of hypotension and ARF Monitor closely for symptoms and signs of fluid overload  * Hypertension BP meds held due to hypotension  * Coronary artery disease with ischemic cardiomyopathy Patient is asymptomatic continue Coreg with holding parameters  Cardiology on case.  *Tobacco abuse disorder Counseled to quit smoking this admission  All the records are reviewed and case discussed with Care Management/Social Workerr. Management plans discussed with the patient, family and they are in agreement.  CODE STATUS: full  TOTAL TIME TAKING  CARE OF THIS PATIENT: 30 minutes.   D/C Plans per primary team   Hillary Bow R M.D on 09/15/2015   Between 7am to 6pm - Pager - [phone]  After 6pm go to [email or web address] - password [email or web address] [facility]  Office  [phone]  CC: Primary care physician; [name], MD  Note: This dictation was prepared with Dragon dictation along with smaller phrase technology. Any transcriptional errors that result from this process are unintentional.

## 2015-09-15 NOTE — Plan of Care (Signed)
Problem: Discharge Progression Outcomes Goal: Other Discharge Outcomes/Goals Outcome: Progressing Plan of care progress to goals:   Discharge plan- pt is from home, PT recommends rehab at discharge.   Activity appropriate for discharge- pt needs assistance with activity. Encourage pt OOB and offer assistance.   Colostomy noted with beefy red stoma and dark loose stool present, foley catheter draining clear yellow urine, TLC to left IJ infusing IVF per MD order, pt refusing teds and scd's, VSS, c/o RLQ abd pain with movement, improved with PRN pain medication.

## 2015-09-15 NOTE — Progress Notes (Signed)
[facility]  ROUNDING NOTE   Subjective:   Complains of abdominal bloating.   UOP 2200 (1500) (1175) Creatinine  5.17 (5.38) (5.46)  Bicarb gtt at 66mL/hr  Objective:  Vital signs in last 24 hours:  Temp:  [98.4 F (36.9 C)-98.8 F (37.1 C)] 98.4 F (36.9 C) (10/09 1213) Pulse Rate:  [80-102] 82 (10/09 1213) Resp:  [18-20] 20 (10/09 1213) BP: (96-120)/(51-83) 107/70 mmHg (10/09 1213) SpO2:  [90 %-97 %] 97 % (10/09 1213)  Weight change:  Filed Weights   09/09/15 1402  Weight: 74.889 kg (165 lb 1.6 oz)    Intake/Output: I/O last 3 completed shifts: In: 2874.3 [P.O.:360; I.V.:2414.3; IV Piggyback:100] Out: 3250 [Urine:3050; Stool:200]   Intake/Output this shift:  Total I/O In: 10 [I.V.:10] Out: -   Physical Exam: General: NAD  Head: Normocephalic, atraumatic. Dry oral mucosal membranes  Eyes: Anicteric, PERRL  Neck: Supple, trachea midline  Lungs:  Clear to auscultation  Heart: Regular rate and rhythm  Abdomen:  Soft, nontender, +right upper quadrant ostomy with some blood in bag  Extremities:  no peripheral edema.  Neurologic: Nonfocal, moving all four extremities  Skin: No lesions  GU foley    Basic Metabolic Panel:  Recent Labs Lab 09/10/15 0530 09/11/15 0433 09/12/15 0440 09/13/15 0604 09/14/15 0927 09/15/15 0524  NA 134* 137 135 136 137 140  K 4.5 4.8 4.6 4.2 3.7 3.6  CL 110 110 111 113* 105 102  CO2 19* 19* 20* 17* 22 27  GLUCOSE 105* 96 80 79 134* 101*  BUN 32* 41* 45* 48* 48* 47*  CREATININE 4.91* 5.03* 5.37* 5.46* 5.38* 5.17*  CALCIUM 7.0* 7.3* 7.2* 7.4* 7.7* 7.7*  MG 1.6*  --   --   --   --   --   PHOS  --   --   --   --  4.7*  --     Liver Function Tests:  Recent Labs Lab 09/09/15 1125 09/10/15 0530 09/11/15 0433 09/12/15 0440 09/13/15 0604 09/14/15 0927  AST 15 12* 11* 11* 12*  --   ALT 7* 7* 6* 7* 6*  --   ALKPHOS 187* 191* 147* 150* 160*  --   BILITOT 0.9 0.9 0.8 1.1 1.3*  --   PROT 4.9* 4.1* 3.8* 3.9*  4.5*  --   ALBUMIN 2.3* 2.1* 1.9* 1.8* 2.9* 2.7*   No results for input(s): LIPASE, AMYLASE in the last 168 hours. No results for input(s): AMMONIA in the last 168 hours.  CBC:  Recent Labs Lab 09/11/15 0433 09/11/15 1230 09/12/15 0440 09/13/15 0604 09/14/15 0927  WBC 18.5* 22.9* 20.5* 18.1* 20.6*  NEUTROABS 15.4* 19.7* 16.3* 14.3* 16.1*  HGB 8.3* 8.8* 8.1* 7.2* 9.1*  HCT 25.9* 26.9* 25.3* 21.9* 28.3*  MCV 79.9* 80.5 80.8 81.0 81.7  PLT 193 196 194 173 171    Cardiac Enzymes: No results for input(s): CKTOTAL, CKMB, CKMBINDEX, TROPONINI in the last 168 hours.  BNP: Invalid input(s): POCBNP  CBG: No results for input(s): GLUCAP in the last 168 hours.  Microbiology: Results for orders placed or performed during the hospital encounter of 09/09/15  Culture, blood (routine x 2)     Status: None   Collection Time: 09/09/15  2:59 PM  Result Value Ref Range Status   Specimen Description BLOOD RIGHT ASSIST CONTROL  Final   Special Requests AER 10ML ANA 10ML  Final   Culture NO GROWTH 5 DAYS  Final   Report Status 09/14/2015 FINAL  Final  Culture, blood (routine x 2)     Status: None   Collection Time: 09/09/15  3:38 PM  Result Value Ref Range Status   Specimen Description BLOOD RIGHT ARM  Final   Special Requests AER 10ML ANA 10ML  Final   Culture NO GROWTH 5 DAYS  Final   Report Status 09/14/2015 FINAL  Final  Urine culture     Status: None   Collection Time: 09/09/15  6:34 PM  Result Value Ref Range Status   Specimen Description URINE, CLEAN CATCH  Final   Special Requests NONE  Final   Culture NO GROWTH 1 DAY  Final   Report Status 09/11/2015 FINAL  Final    Coagulation Studies: No results for input(s): LABPROT, INR in the last 72 hours.  Urinalysis: No results for input(s): COLORURINE, LABSPEC, PHURINE, GLUCOSEU, HGBUR, BILIRUBINUR, KETONESUR, PROTEINUR, UROBILINOGEN, NITRITE, LEUKOCYTESUR in the last 72 hours.  Invalid input(s): APPERANCEUR    Imaging: Dg  Chest 2 View  09/14/2015   CLINICAL DATA:  Hypoxia.  Lymphoma.  EXAM: CHEST  2 VIEW  COMPARISON:  12/03/2014 chest radiograph.  FINDINGS: Median sternotomy wires are aligned and intact. Left internal jugular central venous catheter terminates in the upper third of the superior vena cava. Lung volumes are low. The cardiomediastinal silhouette is likely stable accounting for portable technique, lordotic positioning and low lung volumes, with mild cardiomegaly. No pneumothorax. Small left pleural effusion. No right pleural effusion. There is mild pulmonary edema. Hazy left lung base opacities likely represent atelectasis.  IMPRESSION: 1. Mild congestive heart failure. 2. Small left pleural effusion. 3. Hazy left basilar lung opacities, likely atelectasis.   Electronically Signed   By: Ilona Sorrel M.D.   On: 09/14/2015 12:10     Medications:   .  sodium bicarbonate  infusion 1000 mL 50 mL/hr at 09/15/15 0829   . amiodarone  200 mg Oral Daily  . famotidine  20 mg Oral Daily  . feeding supplement (ENSURE ENLIVE)  237 mL Oral TID WC  . pantoprazole  40 mg Oral Daily  . piperacillin-tazobactam (ZOSYN)  IV  3.375 g Intravenous Q12H  . sodium chloride  10-40 mL Intracatheter Q12H   acetaminophen **OR** acetaminophen, heparin lock flush, heparin lock flush, morphine injection, ondansetron (ZOFRAN) IVPB [facility] +/- dexamethasone, ondansetron, oxyCODONE, simethicone, sodium chloride, sodium chloride  Assessment/ Plan:  [name] is a 69 y.o.white male with coronary artery disease, congestive heart failure, hypertension, hyperlipidemia, follicular lymphoma who was admitted to [facility] on 09/09/2015 for acute renal failure. Colonic obstruction   1. Acute Renal Failure: most likely ATN. Urine output nonoliguric and improving. However creatinine seems to be plateaued.  - Continue IV fluids  sodium bicarbonate infusion at 50 - status post albumin IV 25mg  q6 x 6 doses - monitor serum electrolytes and urine  output. No acute indication for dialysis at this time.   2. Hypertension: well controlled. - holding home dose of carvedilol.   3. Anemia: with persistent leukocytosis. Anemia is long standing. Status post PRBC transfusion 10/7 - Appreciate oncology input.   4. Chronic Systolic Congestive Heart Failure: not acute exacerbation. Monitor volume status. EF 35-40% on echo from 10/2014. Currently holding diuretics.     LOS: [place], [place] 10/9/201612:48 PM

## 2015-09-16 LAB — COMPREHENSIVE METABOLIC PANEL
ALK PHOS: 204 U/L — AB (ref 38–126)
ALT: 9 U/L — AB (ref 17–63)
AST: 13 U/L — ABNORMAL LOW (ref 15–41)
Albumin: 2.2 g/dL — ABNORMAL LOW (ref 3.5–5.0)
Anion gap: 10 (ref 5–15)
BUN: 48 mg/dL — ABNORMAL HIGH (ref 6–20)
CALCIUM: 7.7 mg/dL — AB (ref 8.9–10.3)
CO2: 30 mmol/L (ref 22–32)
CREATININE: 4.84 mg/dL — AB (ref 0.61–1.24)
Chloride: 101 mmol/L (ref 101–111)
GFR, EST AFRICAN AMERICAN: 13 mL/min — AB (ref >60)
GFR, EST NON AFRICAN AMERICAN: 11 mL/min — AB (ref >60)
Glucose, Bld: 104 mg/dL — ABNORMAL HIGH (ref 65–99)
Potassium: 3.8 mmol/L (ref 3.5–5.1)
Sodium: 141 mmol/L (ref 135–145)
Total Bilirubin: 1.3 mg/dL — ABNORMAL HIGH (ref 0.3–1.2)
Total Protein: 4.2 g/dL — ABNORMAL LOW (ref 6.5–8.1)

## 2015-09-16 LAB — CBC WITH DIFFERENTIAL/PLATELET
Band Neutrophils: 26 %
Basophils Absolute: 0 10*3/uL (ref 0–0.1)
Basophils Relative: 0 %
Blasts: 0 %
EOS PCT: 3 %
Eosinophils Absolute: 0.6 10*3/uL (ref 0–0.7)
HEMATOCRIT: 27.3 % — AB (ref 40.0–52.0)
HEMOGLOBIN: 9.1 g/dL — AB (ref 13.0–18.0)
Lymphocytes Relative: 5 %
Lymphs Abs: 1.1 10*3/uL (ref 1.0–3.6)
MCH: 27.4 pg (ref 26.0–34.0)
MCHC: 33.5 g/dL (ref 32.0–36.0)
MCV: 82 fL (ref 80.0–100.0)
MYELOCYTES: 0 %
Metamyelocytes Relative: 0 %
Monocytes Absolute: 0.2 10*3/uL (ref 0.2–1.0)
Monocytes Relative: 1 %
NEUTROS PCT: 65 %
NRBC: 0 /100{WBCs}
Neutro Abs: 19.6 10*3/uL — ABNORMAL HIGH (ref 1.4–6.5)
Other: 0 %
PROMYELOCYTES ABS: 0 %
Platelets: 168 10*3/uL (ref 150–440)
RBC: 3.33 MIL/uL — AB (ref 4.40–5.90)
RDW: 24.1 % — ABNORMAL HIGH (ref 11.5–14.5)
WBC: 21.5 10*3/uL — AB (ref 3.8–10.6)

## 2015-09-16 MED ORDER — POTASSIUM CHLORIDE IN NACL 20-0.45 MEQ/L-% IV SOLN
INTRAVENOUS | Status: DC
  Administered 2015-09-16 – 2015-09-24 (×6): via INTRAVENOUS
  Filled 2015-09-16 (×12): qty 1000

## 2015-09-16 MED ORDER — METRONIDAZOLE 500 MG PO TABS
500.0000 mg | ORAL_TABLET | Freq: Three times a day (TID) | ORAL | Status: DC
  Administered 2015-09-16 – 2015-09-18 (×7): 500 mg via ORAL
  Filled 2015-09-16 (×8): qty 1

## 2015-09-16 MED ORDER — CIPROFLOXACIN HCL 250 MG PO TABS
500.0000 mg | ORAL_TABLET | ORAL | Status: DC
  Administered 2015-09-16 – 2015-09-17 (×3): 500 mg via ORAL
  Filled 2015-09-16 (×3): qty 2

## 2015-09-16 NOTE — Plan of Care (Addendum)
Problem: Discharge Progression Outcomes Goal: Other Discharge Outcomes/Goals Outcome: Progressing Plan of care progress to goal:  Patient complaining of abdominal pain, relief noted with PRN pain medication. Colostomy in place - dark brown soft stool present. Foley catheter in place for strict output measurement. IV fluids infusing as ordered. Family at bedside.   Patient on 2L-O2, sat 95%. IV antibiotics discontinued - PO ordered.

## 2015-09-16 NOTE — Progress Notes (Signed)
[facility]  ROUNDING NOTE   Subjective:  Pt seen at bedside.  Reports some abdominal pain with eating.  Case discussed with oncology. Cr down to 4.84 at present.  Objective:  Vital signs in last 24 hours:  Temp:  [97.5 F (36.4 C)-98.3 F (36.8 C)] 97.5 F (36.4 C) (10/10 1319) Pulse Rate:  [77-100] 96 (10/10 1319) Resp:  [18-19] 19 (10/10 1319) BP: (91-123)/(57-67) 111/67 mmHg (10/10 1319) SpO2:  [91 %-95 %] 95 % (10/10 1319)  Weight change:  Filed Weights   09/09/15 1402  Weight: 74.889 kg (165 lb 1.6 oz)    Intake/Output: I/O last 3 completed shifts: In: 3047.8 [P.O.:480; I.V.:2417.8; IV Piggyback:150] Out: 3400 [Urine:2900; Stool:500]   Intake/Output this shift:  Total I/O In: 255.8 [P.O.:240; I.V.:15.8] Out: 900 [Urine:800; Stool:100]  Physical Exam: General: NAD  Head: Normocephalic, atraumatic. Dry oral mucosal membranes  Eyes: Anicteric  Neck: Supple, trachea midline  Lungs:  Clear to auscultation normal effort  Heart: Regular rate and rhythm  Abdomen:  Soft, nontender, +right upper quadrant ostomy   Extremities:  no peripheral edema.  Neurologic: Nonfocal, moving all four extremities  Skin: No lesions  GU foley    Basic Metabolic Panel:  Recent Labs Lab 09/10/15 0530  09/12/15 0440 09/13/15 0604 09/14/15 0927 09/15/15 0524 09/16/15 0634  NA 134*  < > 135 136 137 140 141  K 4.5  < > 4.6 4.2 3.7 3.6 3.8  CL 110  < > 111 113* 105 102 101  CO2 19*  < > 20* 17* 22 27 30   GLUCOSE 105*  < > 80 79 134* 101* 104*  BUN 32*  < > 45* 48* 48* 47* 48*  CREATININE 4.91*  < > 5.37* 5.46* 5.38* 5.17* 4.84*  CALCIUM 7.0*  < > 7.2* 7.4* 7.7* 7.7* 7.7*  MG 1.6*  --   --   --   --   --   --   PHOS  --   --   --   --  4.7*  --   --   < > = values in this interval not displayed.  Liver Function Tests:  Recent Labs Lab 09/10/15 0530 09/11/15 0433 09/12/15 0440 09/13/15 0604 09/14/15 0927 09/16/15 0634  AST 12* 11* 11* 12*  --  13*   ALT 7* 6* 7* 6*  --  9*  ALKPHOS 191* 147* 150* 160*  --  204*  BILITOT 0.9 0.8 1.1 1.3*  --  1.3*  PROT 4.1* 3.8* 3.9* 4.5*  --  4.2*  ALBUMIN 2.1* 1.9* 1.8* 2.9* 2.7* 2.2*   No results for input(s): LIPASE, AMYLASE in the last 168 hours. No results for input(s): AMMONIA in the last 168 hours.  CBC:  Recent Labs Lab 09/11/15 1230 09/12/15 0440 09/13/15 0604 09/14/15 0927 09/16/15 0634  WBC 22.9* 20.5* 18.1* 20.6* 21.5*  NEUTROABS 19.7* 16.3* 14.3* 16.1* 19.6*  HGB 8.8* 8.1* 7.2* 9.1* 9.1*  HCT 26.9* 25.3* 21.9* 28.3* 27.3*  MCV 80.5 80.8 81.0 81.7 82.0  PLT 196 194 173 171 168    Cardiac Enzymes: No results for input(s): CKTOTAL, CKMB, CKMBINDEX, TROPONINI in the last 168 hours.  BNP: Invalid input(s): POCBNP  CBG: No results for input(s): GLUCAP in the last 168 hours.  Microbiology: Results for orders placed or performed during the hospital encounter of 09/09/15  Culture, blood (routine x 2)     Status: None   Collection Time: 09/09/15  2:59 PM  Result Value Ref Range  Status   Specimen Description BLOOD RIGHT ASSIST CONTROL  Final   Special Requests AER 10ML ANA 10ML  Final   Culture NO GROWTH 5 DAYS  Final   Report Status 09/14/2015 FINAL  Final  Culture, blood (routine x 2)     Status: None   Collection Time: 09/09/15  3:38 PM  Result Value Ref Range Status   Specimen Description BLOOD RIGHT ARM  Final   Special Requests AER 10ML ANA 10ML  Final   Culture NO GROWTH 5 DAYS  Final   Report Status 09/14/2015 FINAL  Final  Urine culture     Status: None   Collection Time: 09/09/15  6:34 PM  Result Value Ref Range Status   Specimen Description URINE, CLEAN CATCH  Final   Special Requests NONE  Final   Culture NO GROWTH 1 DAY  Final   Report Status 09/11/2015 FINAL  Final    Coagulation Studies: No results for input(s): LABPROT, INR in the last 72 hours.  Urinalysis: No results for input(s): COLORURINE, LABSPEC, PHURINE, GLUCOSEU, HGBUR, BILIRUBINUR,  KETONESUR, PROTEINUR, UROBILINOGEN, NITRITE, LEUKOCYTESUR in the last 72 hours.  Invalid input(s): APPERANCEUR    Imaging: No results found.   Medications:   . 0.45 % NaCl with KCl 20 mEq / L 50 mL/hr at 09/16/15 0858   . amiodarone  200 mg Oral Daily  . ciprofloxacin  500 mg Oral Q18H  . famotidine  20 mg Oral Daily  . feeding supplement (ENSURE ENLIVE)  237 mL Oral TID WC  . metroNIDAZOLE  500 mg Oral 3 times per day  . pantoprazole  40 mg Oral Daily  . sodium chloride  10-40 mL Intracatheter Q12H   acetaminophen **OR** acetaminophen, heparin lock flush, heparin lock flush, morphine injection, ondansetron (ZOFRAN) IVPB [facility] +/- dexamethasone, ondansetron, oxyCODONE, simethicone, sodium chloride, sodium chloride  Assessment/ Plan:  Craig Page is a 69 y.o.white male with coronary artery disease, congestive heart failure, hypertension, hyperlipidemia, follicular lymphoma who was admitted to [facility] on 09/09/2015 for acute renal failure. Colonic obstruction   1. Acute Renal Failure: most likely ATN.  -Cr trending down slowly, original UA reviewed, no proteinuria noted.   Continue IVF hydration for now, no indication for HD as pt making adequate amount of urine, keep foley in for now.    2. Hypertension: BP 111/67, off antihypertensives at the moment.   3. Anemia: with persistent leukocytosis. Hgb currently 9.1, continue to monitor CBC.  Would avoid epogen given underlying malignancy.  4. Chronic Systolic Congestive Heart Failure: not acute exacerbation. Monitor volume status. EF 35-40% on echo from 10/2014. Currently holding diuretics.     LOS: 7 Craig Page 10/10/20163:25 PM

## 2015-09-16 NOTE — Progress Notes (Signed)
69  Yo male ordered Ciprofloxacin 500 mg BID with CrCl 15.5 ml/min.   Dose reduced to 500 mg q 18hr per renal function per renal dosing policy.

## 2015-09-16 NOTE — Progress Notes (Signed)
Nutrition Follow-up   INTERVENTION:   Meals and Snacks: Cater to patient preferences. RD offered much encouragement on visit today to increase po intake. Medical Food Supplement Therapy: Continue Ensure as pt drinks sips from  Coordination of Care: may need calorie count for further documentation of po intake. Will request new weight as last weight 7 days ago. Education: RD provided pt with "Colostomy Nutrition Therapy" from [employer]. Provided education briefly on importance of small frequent meals, increasing fluid intake, and maintaining low fiber currently. Handout provides many recommendations of foods.  Teach back used. Expect good compliance.    NUTRITION DIAGNOSIS:   Inadequate oral intake related to inability to eat as evidenced by NPO status.  GOAL:   Patient will meet greater than or equal to 90% of their needs; ongoing  MONITOR:    (Energy Intake, Electrolyte and renal Profile, Digestive System, Anthropometrics)  REASON FOR ASSESSMENT:   Malnutrition Screening Tool    ASSESSMENT:   Pt with colostomy, dark stool present. Per MD note pt c/o abdominal pain when eating. Pt got up to chair with PT this am per RN Ok Edwards.   Diet Order:  DIET SOFT Room service appropriate?: Yes; Fluid consistency:: Thin    Current Nutrition: Pt ate bites of meals yesterday, 60% documented onf one meal the day before. This morning pt ate bites of eggs and 5 sips of Ensure.    Gastrointestinal Profile: Last BM: 195mL documented out via ostomy today   Medications: 0.45%NS with KCl at 24mL/hr, Protonix, Cipro, Flagyl, pepcid  Electrolyte/Renal Profile and Glucose Profile:   Recent Labs Lab 09/10/15 0530  09/14/15 0927 09/15/15 0524 09/16/15 0634  NA 134*  < > 137 140 141  K 4.5  < > 3.7 3.6 3.8  CL 110  < > 105 102 101  CO2 19*  < > 22 27 30   BUN 32*  < > 48* 47* 48*  CREATININE 4.91*  < > 5.38* 5.17* 4.84*  CALCIUM 7.0*  < > 7.7* 7.7* 7.7*  MG 1.6*   --   --   --   --   PHOS  --   --  4.7*  --   --   GLUCOSE 105*  < > 134* 101* 104*  < > = values in this interval not displayed. Protein Profile:   Recent Labs Lab 09/13/15 0604 09/14/15 0927 09/16/15 0634  ALBUMIN 2.9* 2.7* 2.2*     Weight Trend since Admission: Filed Weights   09/09/15 1402  Weight: 165 lb 1.6 oz (74.889 kg)     Skin:  Reviewed, no issues   BMI:  Body mass index is 23.04 kg/(m^2).  Estimated Nutritional Needs:   Kcal:  BEE: 1536kcals, TEE: (IF 1.1-1.3)(AF 1.2) [id number]  Protein:  75-82g protein (1.0-1.2g/kg)  Fluid:  1873-2265mL of fluid (25-92mL/kg)  EDUCATION NEEDS:   Education needs addressed   [place], RD, LDN Pager [phone]

## 2015-09-16 NOTE — Care Management Important Message (Signed)
Important Message  Patient Details  Name: GEORGIA BARIA MRN: [id number] Date of Birth: 09-Feb-1946   Medicare Important Message Given:  Yes-fourth notification given    Shelbie Ammons, RN 09/16/2015, 10:59 AM

## 2015-09-16 NOTE — Progress Notes (Signed)
SUBJECTIVE: Patient is feeling much better with eating breakfast with no complaints. Patient denies any shortness of breath.   Filed Vitals:   09/15/15 1213 09/15/15 2059 09/16/15 0030 09/16/15 0457  BP: 107/70 114/66 123/64 91/57  Pulse: 82 77 100 89  Temp: 98.4 F (36.9 C) 98.3 F (36.8 C) 98.1 F (36.7 C) 97.5 F (36.4 C)  TempSrc: Oral Oral Oral Oral  Resp: 20 18 18 18   Height:      Weight:      SpO2: 97% 95% 91% 93%    Intake/Output Summary (Last 24 hours) at 09/16/15 0846 Last data filed at 09/16/15 2633  Gross per 24 hour  Intake 1876.16 ml  Output   1850 ml  Net  26.16 ml    LABS: Basic Metabolic Panel:  Recent Labs  09/14/15 0927 09/15/15 0524 09/16/15 0634  NA 137 140 141  K 3.7 3.6 3.8  CL 105 102 101  CO2 22 27 30   GLUCOSE 134* 101* 104*  BUN 48* 47* 48*  CREATININE 5.38* 5.17* 4.84*  CALCIUM 7.7* 7.7* 7.7*  PHOS 4.7*  --   --    Liver Function Tests:  Recent Labs  09/14/15 0927 09/16/15 0634  AST  --  13*  ALT  --  9*  ALKPHOS  --  204*  BILITOT  --  1.3*  PROT  --  4.2*  ALBUMIN 2.7* 2.2*   No results for input(s): LIPASE, AMYLASE in the last 72 hours. CBC:  Recent Labs  09/14/15 0927 09/16/15 0634  WBC 20.6* 21.5*  NEUTROABS 16.1* 19.6*  HGB 9.1* 9.1*  HCT 28.3* 27.3*  MCV 81.7 82.0  PLT 171 168   Cardiac Enzymes: No results for input(s): CKTOTAL, CKMB, CKMBINDEX, TROPONINI in the last 72 hours. BNP: Invalid input(s): POCBNP D-Dimer: No results for input(s): DDIMER in the last 72 hours. Hemoglobin A1C: No results for input(s): HGBA1C in the last 72 hours. Fasting Lipid Panel: No results for input(s): CHOL, HDL, LDLCALC, TRIG, CHOLHDL, LDLDIRECT in the last 72 hours. Thyroid Function Tests: No results for input(s): TSH, T4TOTAL, T3FREE, THYROIDAB in the last 72 hours.  Invalid input(s): FREET3 Anemia Panel: No results for input(s): VITAMINB12, FOLATE, FERRITIN, TIBC, IRON, RETICCTPCT in the last 72  hours.   PHYSICAL EXAM General: Well developed, well nourished, in no acute distress HEENT:  Normocephalic and atramatic Neck:  No JVD.  Lungs: Clear bilaterally to auscultation and percussion. Heart: HRRR . Normal S1 and S2 without gallops or murmurs.  Abdomen: Bowel sounds are positive, abdomen soft and non-tender  Msk:  Back normal, normal gait. Normal strength and tone for age. Extremities: No clubbing, cyanosis or edema.   Neuro: Alert and oriented X 3. Psych:  Good affect, responds appropriately  TELEMETRY: Patient not on monitor  ASSESSMENT AND PLAN: CHF with history of cardiomyopathy ejection fraction 42% status post colonic surgery. Doing well.  Active Problems:   Colonic obstruction (HCC)   Peritoneal carcinomatosis (HCC)   Colonic mass   Abdominal distension    Neoma Laming A, MD, [facility] 09/16/2015 8:46 AM

## 2015-09-16 NOTE — Progress Notes (Signed)
[place] [facility] [place] NAME: Craig Page    MR#:  [id number]  DATE OF BIRTH:  16-Feb-1946  SUBJECTIVE:  CHIEF COMPLAINT:  No chief complaint on file.  Complains of some pain in his abdomen on movement. Nausea. No vomiting. Overall feels weak. He feels is slowly getting better. Urine output 1550 ML Poor appetite  REVIEW OF SYSTEMS:  Review of Systems  Constitutional: Positive for malaise/fatigue. Negative for fever, chills, weight loss and diaphoresis.  HENT: Negative for ear discharge and sore throat.   Eyes: Negative for blurred vision, double vision and pain.  Respiratory: Positive for cough. Negative for hemoptysis, sputum production, shortness of breath and wheezing.   Cardiovascular: Negative for chest pain, palpitations, orthopnea and leg swelling.  Gastrointestinal: Positive for nausea and abdominal pain. Negative for heartburn, vomiting and blood in stool.  Genitourinary: Negative for dysuria, urgency, frequency and hematuria.  Musculoskeletal: Negative for myalgias, back pain, joint pain and falls.  Skin: Negative for itching and rash.  Neurological: Positive for weakness. Negative for dizziness and focal weakness.  Psychiatric/Behavioral: Positive for memory loss. The patient is not nervous/anxious.    DRUG ALLERGIES:  No Known Allergies VITALS:  Blood pressure 91/57, pulse 89, temperature 97.5 F (36.4 C), temperature source Oral, resp. rate 18, height 5\' 11"  (1.803 m), weight 74.889 kg (165 lb 1.6 oz), SpO2 93 %. PHYSICAL EXAMINATION:   Physical Exam  Constitutional: He is oriented to person, place, and time and well-developed, well-nourished, and in no distress.  HENT:  Head: Normocephalic and atraumatic.  Eyes: Conjunctivae and EOM are normal. Pupils are equal, round, and reactive to light.  Neck: Normal range of motion. Neck supple. No tracheal deviation present. No thyromegaly present.  Cardiovascular: Normal  rate, regular rhythm and normal heart sounds.   Pulmonary/Chest: Effort normal and breath sounds normal. No respiratory distress. He has no wheezes. He exhibits no tenderness.  Decreased breath sounds at bases  Abdominal: Soft. Bowel sounds are normal. He exhibits distension. There is tenderness.  Colostomy bag  Genitourinary:  Foley in place.  Musculoskeletal: Normal range of motion.  Neurological: He is alert and oriented to person, place, and time. No cranial nerve deficit.  Skin: Skin is warm and dry. No rash noted.  Psychiatric: Mood and affect normal.   LABORATORY PANEL:   CBC  Recent Labs Lab 09/16/15 0634  WBC 21.5*  HGB 9.1*  HCT 27.3*  PLT 168   ------------------------------------------------------------------------------------------------------------------  Chemistries   Recent Labs Lab 09/10/15 0530  09/16/15 0634  NA 134*  < > 141  K 4.5  < > 3.8  CL 110  < > 101  CO2 19*  < > 30  GLUCOSE 105*  < > 104*  BUN 32*  < > 48*  CREATININE 4.91*  < > 4.84*  CALCIUM 7.0*  < > 7.7*  MG 1.6*  --   --   AST 12*  < > 13*  ALT 7*  < > 9*  ALKPHOS 191*  < > 204*  BILITOT 0.9  < > 1.3*  < > = values in this interval not displayed. ------------------------------------------------------------------------------------------------------------------  Cardiac Enzymes No results for input(s): TROPONINI in the last 168 hours. ------------------------------------------------------------------------------------------------------------------  RADIOLOGY:  Dg Chest 2 View  09/14/2015   CLINICAL DATA:  Hypoxia.  Lymphoma.  EXAM: CHEST  2 VIEW  COMPARISON:  12/03/2014 chest radiograph.  FINDINGS: Median sternotomy wires are aligned and intact. Left internal jugular central venous catheter  terminates in the upper third of the superior vena cava. Lung volumes are low. The cardiomediastinal silhouette is likely stable accounting for portable technique, lordotic positioning and low  lung volumes, with mild cardiomegaly. No pneumothorax. Small left pleural effusion. No right pleural effusion. There is mild pulmonary edema. Hazy left lung base opacities likely represent atelectasis.  IMPRESSION: 1. Mild congestive heart failure. 2. Small left pleural effusion. 3. Hazy left basilar lung opacities, likely atelectasis.   Electronically Signed   By: Ilona Sorrel M.D.   On: 09/14/2015 12:10    ASSESSMENT AND PLAN:   Active Problems:   Colonic obstruction (HCC)   Peritoneal carcinomatosis (HCC)   Colonic mass   Abdominal distension  * Acute hypoxic respiratory failure Saturations 87% on room air earlier. Patient is now on 1-2 L oxygen. Mild CHF on CXR No lasix due to ARF  * History of follicular lymphoma with the splenic flexure perforation postop day #4 status post exploratory laparotomy and colon resection Pain management and postop care per surgery Changed to PO abx  * Acute renal failure probably secondary to ATN - No significant improvement Avoid nephrotoxins Nephrology following   * Anemia- anemia of chronic disease and acute blood loss anemia status post surgery. 1 unit packed RBC transfused on 09/13/2015.  * Chronic systolic congestive heart failure Held Lasix and spironolactone in view of hypotension and ARF Monitor closely for symptoms and signs of fluid overload  * Hypertension BP meds held due to hypotension  * Coronary artery disease with ischemic cardiomyopathy Patient is asymptomatic continue Coreg with holding parameters  Cardiology on case.  *Tobacco abuse disorder Counseled to quit smoking this admission  All the records are reviewed and case discussed with Care Management/Social Workerr. Management plans discussed with the patient, family and they are in agreement.  CODE STATUS: full  TOTAL TIME TAKING CARE OF THIS PATIENT: 30 minutes.    Hillary Bow R M.D on 09/16/2015   Between 7am to 6pm - Pager - [phone]  After 6pm go  to [email or web address] - password [email or web address] [facility]  Office  [phone]  CC: Primary care physician; [name], MD  Note: This dictation was prepared with Dragon dictation along with smaller phrase technology. Any transcriptional errors that result from this process are unintentional.

## 2015-09-16 NOTE — Plan of Care (Signed)
Problem: Discharge Progression Outcomes Goal: Other Discharge Outcomes/Goals Outcome: Progressing Plan of care progress to goals:    Discharge plan- pt is from home, PT recommends rehab at discharge.     Activity appropriate for discharge- pt needs assistance with activity. Encourage pt OOB and offer assistance.   Colostomy noted with beefy red stoma and dark brown soft stool present, foley catheter draining clear yellow urine, TLC to left IJ infusing IVF per MD order, pt refusing teds and scd's, VSS, c/o RLQ abd pain with movement, improved with PRN pain medication.

## 2015-09-16 NOTE — Progress Notes (Signed)
Craig Page   DOB:01-01-1946   WU#:[id number]    CC: Likely metastatic colon cancer status post palliative surgery with colostomy day # 6  Subjective:  Pt still is not ambulating; He is able to sit by the edge of the bed. He is awaiting physical therapy this morning. He denies any significant abdominal pain/states his ostomy is working well.  ROS: No fever no chills. No diarrhea.  Objective:  Filed Vitals:   09/16/15 0457  BP: 91/57  Pulse: 89  Temp: 97.5 F (36.4 C)  Resp: 18     Intake/Output Summary (Last 24 hours) at 09/16/15 0819 Last data filed at 09/16/15 7253  Gross per 24 hour  Intake 1876.16 ml  Output   1850 ml  Net  26.16 ml    GENERAL:alert, no distress and comfortable. he is alone. Resting in bed SKIN: skin color, texture, turgor are normal, no rashes or significant lesions EYES: Positive for pallor. OROPHARYNX:no Thrush or ulceration. LYMPH: no palpable lymphadenopathy in the cervical, axillary or inguinal LUNGS: Decreased breath sounds bilaterally.  HEART: regular rate & rhythm and no murmurs and no lower extremity edema ABDOMEN:abdomen soft, mild distention noted mildly tender. Ostomy working well. No rigidity or guarding. normal bowel sounds Musculoskeletal:no cyanosis of digits and no clubbing  NEURO: alert & oriented x 3 with fluent speech, no focal motor/sensory deficits   Labs:  Lab Results  Component Value Date   WBC 21.5* 09/16/2015   HGB 9.1* 09/16/2015   HCT 27.3* 09/16/2015   MCV 82.0 09/16/2015   PLT 168 09/16/2015   NEUTROABS 19.6* 09/16/2015    Lab Results  Component Value Date   NA 141 09/16/2015   K 3.8 09/16/2015   CL 101 09/16/2015   CO2 30 09/16/2015    Studies:  Dg Chest 2 View  09/14/2015   CLINICAL DATA:  Hypoxia.  Lymphoma.  EXAM: CHEST  2 VIEW  COMPARISON:  12/03/2014 chest radiograph.  FINDINGS: Median sternotomy wires are aligned and intact. Left internal jugular central venous catheter terminates in the upper  third of the superior vena cava. Lung volumes are low. The cardiomediastinal silhouette is likely stable accounting for portable technique, lordotic positioning and low lung volumes, with mild cardiomegaly. No pneumothorax. Small left pleural effusion. No right pleural effusion. There is mild pulmonary edema. Hazy left lung base opacities likely represent atelectasis.  IMPRESSION: 1. Mild congestive heart failure. 2. Small left pleural effusion. 3. Hazy left basilar lung opacities, likely atelectasis.   Electronically Signed   By: Ilona Sorrel M.D.   On: 09/14/2015 12:10     Assessment & Plan:   # Clinical concern for primary colonic malignancy with omental/liver metastases;  CEA- 16; pathology pending; preliminary pathology/verbal-"hepatocellular" . Status post palliative surgery postop day 6.  # Acute renal failure creatinine- slightly better at 4.84/ however patient making urine-nonoliguric. On Bicarb.  Patient on Foley catheter; await recommendations from nephrology regarding discontinuation of the catheter.  # leukocytosis-likely reactive; no fever no chills. We'll de-escalate antibiotics. Started on oral Cipro/Flagyl.  # anemia- hemoglobin 9.1 s/p Transfusion on 7th- monitor for now.  # Ambulation/physical therapy- reviewed with the patient is motivated.   Above plan of care was discussed with the patient in detail.   Cammie Sickle, MD 09/16/2015  8:19 AM

## 2015-09-17 LAB — CBC WITH DIFFERENTIAL/PLATELET
BASOS PCT: 0 %
Band Neutrophils: 19 %
Basophils Absolute: 0 10*3/uL (ref 0–0.1)
Blasts: 0 %
EOS PCT: 7 %
Eosinophils Absolute: 1.4 10*3/uL — ABNORMAL HIGH (ref 0–0.7)
HEMATOCRIT: 25.3 % — AB (ref 40.0–52.0)
Hemoglobin: 8.1 g/dL — ABNORMAL LOW (ref 13.0–18.0)
LYMPHS ABS: 0.4 10*3/uL — AB (ref 1.0–3.6)
Lymphocytes Relative: 2 %
MCH: 27.2 pg (ref 26.0–34.0)
MCHC: 32 g/dL (ref 32.0–36.0)
MCV: 84.8 fL (ref 80.0–100.0)
METAMYELOCYTES PCT: 0 %
MONO ABS: 0.8 10*3/uL (ref 0.2–1.0)
MONOS PCT: 4 %
MYELOCYTES: 0 %
NEUTROS ABS: 17.4 10*3/uL — AB (ref 1.4–6.5)
NRBC: 0 /100{WBCs}
Neutrophils Relative %: 68 %
Other: 0 %
PLATELETS: 156 10*3/uL (ref 150–440)
Promyelocytes Absolute: 0 %
RBC: 2.98 MIL/uL — ABNORMAL LOW (ref 4.40–5.90)
RDW: 25 % — ABNORMAL HIGH (ref 11.5–14.5)
Smear Review: ADEQUATE
WBC: 20 10*3/uL — ABNORMAL HIGH (ref 3.8–10.6)

## 2015-09-17 LAB — BASIC METABOLIC PANEL
ANION GAP: 13 (ref 5–15)
BUN: 48 mg/dL — ABNORMAL HIGH (ref 6–20)
CALCIUM: 7.9 mg/dL — AB (ref 8.9–10.3)
CO2: 27 mmol/L (ref 22–32)
CREATININE: 4.54 mg/dL — AB (ref 0.61–1.24)
Chloride: 98 mmol/L — ABNORMAL LOW (ref 101–111)
GFR calc Af Amer: 14 mL/min — ABNORMAL LOW (ref >60)
GFR, EST NON AFRICAN AMERICAN: 12 mL/min — AB (ref >60)
GLUCOSE: 90 mg/dL (ref 65–99)
Potassium: 3.9 mmol/L (ref 3.5–5.1)
Sodium: 138 mmol/L (ref 135–145)

## 2015-09-17 NOTE — Progress Notes (Signed)
Craig  [phone]  Fax:([phone]) [facility] DOB: December 09, 1945  MR#: [id number]  FWY#:[id number]  Patient Care Team: Arlis Porta., MD as PCP - General (Family Medicine) Dionisio David, MD as Consulting Physician (Cardiology)  CHIEF COMPLAINT:  Likely metastatic colon cancer status post palliative surgery with colostomy day 7.  INTERVAL HISTORY:  Patient was admitted into the hospital with bowel obstruction, related to metastatic colon cancer. He is status post palliative surgery with colostomy approximately 1 week ago. Patient has walked with physical therapy today. PT recommendations at discharge were for skilled nursing facility for rehabilitation. Patient denies any complaints of abdominal pain, diarrhea, or bright red bleeding from ostomy. He reports his appetite is improving slowly.   REVIEW OF SYSTEMS:   Review of Systems  Constitutional: Positive for malaise/fatigue. Negative for fever, chills, weight loss and diaphoresis.  HENT: Negative for congestion, ear discharge, ear pain, hearing loss, nosebleeds, sore throat and tinnitus.   Eyes: Negative for blurred vision, double vision, photophobia, pain, discharge and redness.  Respiratory: Negative for cough, hemoptysis, sputum production, shortness of breath, wheezing and stridor.   Cardiovascular: Negative for chest pain, palpitations, orthopnea, claudication, leg swelling and PND.  Gastrointestinal: Negative for heartburn, nausea, vomiting, abdominal pain, diarrhea, constipation, blood in stool and melena.  Genitourinary: Negative.   Musculoskeletal: Negative.   Skin: Negative.   Neurological: Positive for weakness. Negative for dizziness, tingling, focal weakness, seizures and headaches.  Endo/Heme/Allergies: Does not bruise/bleed easily.  Psychiatric/Behavioral: Negative for depression. The patient is not nervous/anxious and does not have insomnia.     As per HPI. Otherwise, a  complete review of systems is negatve.   PAST MEDICAL HISTORY: Past Medical History  Diagnosis Date  . CAD (coronary artery disease)     3v  . Ischemic cardiomyopathy   . Chronic systolic heart failure ([place])   . HTN (hypertension)   . HLD (hyperlipidemia)   . Follicular lymphoma ([place])   . Myocardial infarction [facility]) 1992    treated with thrombolytics/notes 10/16/2014    PAST SURGICAL HISTORY: Past Surgical History  Procedure Laterality Date  . Hernia repair    . Umbilical hernia repair  1964  . Coronary artery bypass graft N/A 10/19/2014    Procedure: CORONARY ARTERY BYPASS GRAFTING (CABG);  Surgeon: Ivin Poot, MD;  Location: [place];  Service: Open Heart Surgery;  Laterality: N/A;  Times 3 using left internal mammary artery and endoscopically harvested left saphenous vein  . Intraoperative transesophageal echocardiogram N/A 10/19/2014    Procedure: INTRAOPERATIVE TRANSESOPHAGEAL ECHOCARDIOGRAM;  Surgeon: Ivin Poot, MD;  Location: [place];  Service: Open Heart Surgery;  Laterality: N/A;  . Laparoscopy N/A 09/10/2015    Procedure: LAPAROSCOPY DIAGNOSTIC;  Surgeon: Christene Lye, MD;  Location: [facility];  Service: General;  Laterality: N/A;  . Laparotomy N/A 09/10/2015    Procedure: EXPLORATORY LAPAROTOMY;  Surgeon: Christene Lye, MD;  Location: [facility];  Service: General;  Laterality: N/A;  . Transverse loop colostomy Right 09/10/2015    Procedure: TRANSVERSE LOOP COLOSTOMY;  Surgeon: Christene Lye, MD;  Location: [facility];  Service: General;  Laterality: Right;    FAMILY HISTORY Family History  Problem Relation Age of Onset  . Heart failure Mother     deceased  . Cirrhosis Father     deceased    GYNECOLOGIC HISTORY:  No LMP for male patient.     ADVANCED DIRECTIVES:    HEALTH  MAINTENANCE: Social History  Substance Use Topics  . Smoking status: Former Smoker -- 1.00 packs/day for 50 years    Types: Cigarettes    Quit date: 09/24/2014    . Smokeless tobacco: Never Used  . Alcohol Use: No     Colonoscopy:  PAP:  Bone density:  Lipid panel:  No Known Allergies  Current Facility-Administered Medications  Medication Dose Route Frequency Provider Last Rate Last Dose  . 0.45 % NaCl with KCl 20 mEq / L infusion   Intravenous Continuous Hillary Bow, MD 50 mL/hr at 09/17/15 0705    . acetaminophen (TYLENOL) tablet 650 mg  650 mg Oral Q6H PRN Seeplaputhur Robinette Haines, MD       Or  . acetaminophen (TYLENOL) suppository 650 mg  650 mg Rectal Q6H PRN Seeplaputhur Robinette Haines, MD      . amiodarone (PACERONE) tablet 200 mg  200 mg Oral Daily Seeplaputhur Robinette Haines, MD   200 mg at 09/17/15 1015  . ciprofloxacin (CIPRO) tablet 500 mg  500 mg Oral Q18H Cammie Sickle, MD   500 mg at 09/17/15 0450  . famotidine (PEPCID) tablet 20 mg  20 mg Oral Daily Lytle Butte, MD   20 mg at 09/17/15 1015  . feeding supplement (ENSURE ENLIVE) (ENSURE ENLIVE) liquid 237 mL  237 mL Oral TID WC Forest Gleason, MD   237 mL at 09/16/15 0935  . heparin lock flush 100 unit/mL  500 Units Intracatheter Daily PRN Cammie Sickle, MD      . heparin lock flush 100 unit/mL  250 Units Intracatheter PRN Cammie Sickle, MD      . metroNIDAZOLE (FLAGYL) tablet 500 mg  500 mg Oral 3 times per day Cammie Sickle, MD   500 mg at 09/17/15 1323  . morphine 2 MG/ML injection 2 mg  2 mg Intravenous Q3H PRN Christene Lye, MD   2 mg at 09/16/15 1939  . ondansetron (ZOFRAN) 4 mg in sodium chloride 0.9 % 50 mL IVPB   Intravenous Q4H PRN Forest Gleason, MD      . ondansetron (ZOFRAN) tablet 4-8 mg  4-8 mg Oral Q8H PRN Seeplaputhur Robinette Haines, MD      . oxyCODONE (Oxy IR/ROXICODONE) immediate release tablet 5-10 mg  5-10 mg Oral Q4H PRN Christene Lye, MD   10 mg at 09/13/15 1503  . pantoprazole (PROTONIX) EC tablet 40 mg  40 mg Oral Daily Seeplaputhur Robinette Haines, MD   40 mg at 09/17/15 1015  . simethicone (MYLICON) chewable tablet 80 mg  80 mg Oral TID  PRN Christene Lye, MD   80 mg at 09/11/15 2035  . sodium chloride 0.9 % injection 10-40 mL  10-40 mL Intracatheter Q12H Forest Gleason, MD   10 mL at 09/16/15 2200  . sodium chloride 0.9 % injection 10-40 mL  10-40 mL Intracatheter PRN Forest Gleason, MD      . sodium chloride 0.9 % injection 3 mL  3 mL Intracatheter PRN Cammie Sickle, MD        OBJECTIVE: BP 98/65 mmHg  Pulse 40  Temp(Src) 97.5 F (36.4 C) (Oral)  Resp 19  Ht 5\' 11"  (1.803 m)  Wt 182 lb (82.555 kg)  BMI 25.40 kg/m2  SpO2 94%   Body mass index is 25.4 kg/(m^2).    ECOG FS:3 - Symptomatic, >50% confined to bed  General: alert and oriented, NAD. Eye: Pale conjunctiva, anicteric sclera. Lungs: Decreased breath sounds bilaterally. Heart: Regular  rate and rhythm. No rubs, murmurs, or gallops. Abdomen: Soft, nontender, slightly distended, ostomy functioning appropriately. No organomegaly noted, normoactive bowel sounds. Musculoskeletal: No edema, cyanosis, or clubbing. Neuro: Alert, answering all questions appropriately. Cranial nerves grossly intact. Skin: No rashes or petechiae noted. Psych: Normal affect.   LAB RESULTS:  Admission on 09/09/2015  No results displayed because visit has over 200 results.      STUDIES: Dg Chest 2 View  09/14/2015   CLINICAL DATA:  Hypoxia.  Lymphoma.  EXAM: CHEST  2 VIEW  COMPARISON:  12/03/2014 chest radiograph.  FINDINGS: Median sternotomy wires are aligned and intact. Left internal jugular central venous catheter terminates in the upper third of the superior vena cava. Lung volumes are low. The cardiomediastinal silhouette is likely stable accounting for portable technique, lordotic positioning and low lung volumes, with mild cardiomegaly. No pneumothorax. Small left pleural effusion. No right pleural effusion. There is mild pulmonary edema. Hazy left lung base opacities likely represent atelectasis.  IMPRESSION: 1. Mild congestive heart failure. 2. Small left pleural  effusion. 3. Hazy left basilar lung opacities, likely atelectasis.   Electronically Signed   By: Ilona Sorrel M.D.   On: 09/14/2015 12:10    ASSESSMENT/ PLAN:   1. Clinical concern for primary colonic malignancy with omental/liver metastases; CEA- 16; pathology pending; preliminary pathology/verbal-"hepatocellular". Status post palliative surgery postop day 7.  2. Acute renal failure creatinine- slightly better at 4.84/ however patient making urine-nonoliguric. On Bicarb. Patient on Foley catheter; await recommendations from nephrology regarding discontinuation of the catheter.  3. Leukocytosis-likely reactive; no fever no chills. Continue oral Cipro/Flagyl.  4. Anemia- hemoglobin 8.1 s/p Transfusion on 7th- monitor for now. If hemoglobin drops to less than 7.5 will transfuse 1 unit.  5. Ambulation/physical therapy- physical therapy has evaluated and deemed most appropriate for skilled nursing facility with rehabilitation and  Dr. Grayland Ormond was available for consultation and review of plan of care for this patient.  [name], NP   09/17/2015 4:30 PM

## 2015-09-17 NOTE — Plan of Care (Signed)
Problem: Discharge Progression Outcomes Goal: Other Discharge Outcomes/Goals Outcome: Progressing Plan of care progress to goal:  No complaints of pain this shift. IV fluids infusing as ordered. Colostomy in place - beefy red stoma/dark brown soft stool present. Foley catheter in place for strict output measurement - clamp at this time per Dr. Holley Raring - may discontinue if appropriate in 2 hours. Poor intake continues - encouragement given. PT consulted - up to chair. Patient remains on 2L-O2. Family at bedside.

## 2015-09-17 NOTE — Progress Notes (Signed)
Patient requesting PT - therapist made aware. Madlyn Frankel, RN

## 2015-09-17 NOTE — Progress Notes (Signed)
Physical Therapy Treatment Patient Details Name: Craig Page MRN: [id number] DOB: 06-21-1946 Today's Date: 09/17/2015    History of Present Illness Pt is a 69 yo male admitted to the hospital s/p colostomy and biopsy     PT Comments    Pt progressing towards goals with greater ambulation distance and more stability with ambulation. He is still very weak with functional mobility and has decreased endurance, requiring breaks with therapy and limited total tolerance. He has improved since Friday and is very motivated to continue to participate in therapy. Due to his deficits, he will continue to benefit from skilled PT in order for him to eventually return home safely.  Follow Up Recommendations  SNF     Equipment Recommendations  Rolling walker with 5" wheels    Recommendations for Other Services       Precautions / Restrictions Restrictions Weight Bearing Restrictions: No Other Position/Activity Restrictions: Ostomy bag     Mobility  Bed Mobility Overal bed mobility:  (Pt in recliner; not assessed )                Transfers Overall transfer level: Needs assistance Equipment used: Rolling walker (2 wheeled) Transfers: Sit to/from Stand Sit to Stand: Min assist         General transfer comment: Pt needing fair amount of assist to get into standing. Needs cues to stand up tall and get his hands on his walker. Pt needs cues to not hold his breath   Ambulation/Gait Ambulation/Gait assistance: Min assist Ambulation Distance (Feet): 15 Feet (F/B x3 sets) Assistive device: Rolling walker (2 wheeled) Gait Pattern/deviations: Step-through pattern;Decreased step length - left;Decreased step length - right Gait velocity: decreased Gait velocity interpretation: <1.8 ft/sec, indicative of risk for recurrent falls General Gait Details: Pt more stable with ambulation than when last seen, however steps are still slow and labored and he has decreased activity tolerance, yet  improved since Friday    Stairs            Wheelchair Mobility    Modified Rankin (Stroke Patients Only)       Balance Overall balance assessment: Needs assistance Sitting-balance support: No upper extremity supported Sitting balance-Leahy Scale: Fair     Standing balance support: Bilateral upper extremity supported Standing balance-Leahy Scale: Fair                      Cognition Arousal/Alertness: Awake/alert Behavior During Therapy: WFL for tasks assessed/performed Overall Cognitive Status: Within Functional Limits for tasks assessed                      Exercises Other Exercises Other Exercises: Pt performed bilateral therex x 12 reps at supervision for proper technique. Exercises included: scap squeeze, press up from chair, LAQ, and ankle pumps (Pt limited in therex by pain in abdomen and fatigue.)    General Comments        Pertinent Vitals/Pain Pain Assessment: 0-10 Pain Score: 4  Pain Location: abdomen Pain Intervention(s): Limited activity within patient's tolerance;Monitored during session;Premedicated before session    Home Living                      Prior Function            PT Goals (current goals can now be found in the care plan section) Acute Rehab PT Goals Patient Stated Goal: to walk further than on friday PT Goal Formulation: With patient Time  For Goal Achievement: 09/27/15 Potential to Achieve Goals: Good Progress towards PT goals: Progressing toward goals    Frequency  Min 2X/week    PT Plan Current plan remains appropriate    Co-evaluation             End of Session Equipment Utilized During Treatment: Gait belt Activity Tolerance: Patient tolerated treatment well Patient left: in chair;with call bell/phone within reach;with chair alarm set     Time: 7425-9563 PT Time Calculation (min) (ACUTE ONLY): 12 min  Charges:                       G CodesJanyth Contes 10/13/2015, 2:35  PM Janyth Contes, SPT. [phone]

## 2015-09-17 NOTE — Progress Notes (Signed)
   SUBJECTIVE: Pt lying in bed comfortably, states he feels like he is getting stronger. No CP, palpitations or SOB.    Filed Vitals:   09/16/15 2129 09/16/15 2135 09/17/15 0447 09/17/15 0500  BP: 120/65  98/65   Pulse: 164 90 92   Temp: 98.1 F (36.7 C)     TempSrc: Oral     Resp: 20  20   Height:      Weight:    82.555 kg (182 lb)  SpO2: 93%  93%     Intake/Output Summary (Last 24 hours) at 09/17/15 0903 Last data filed at 09/17/15 0705  Gross per 24 hour  Intake    530 ml  Output   2300 ml  Net  -1770 ml    LABS: Basic Metabolic Panel:  Recent Labs  09/14/15 0927  09/16/15 0634 09/17/15 0448  NA 137  < > 141 138  K 3.7  < > 3.8 3.9  CL 105  < > 101 98*  CO2 22  < > 30 27  GLUCOSE 134*  < > 104* 90  BUN 48*  < > 48* 48*  CREATININE 5.38*  < > 4.84* 4.54*  CALCIUM 7.7*  < > 7.7* 7.9*  PHOS 4.7*  --   --   --   < > = values in this interval not displayed. Liver Function Tests:  Recent Labs  09/14/15 0927 09/16/15 0634  AST  --  13*  ALT  --  9*  ALKPHOS  --  204*  BILITOT  --  1.3*  PROT  --  4.2*  ALBUMIN 2.7* 2.2*   No results for input(s): LIPASE, AMYLASE in the last 72 hours. CBC:  Recent Labs  09/16/15 0634 09/17/15 0448  WBC 21.5* 20.0*  NEUTROABS 19.6* 17.4*  HGB 9.1* 8.1*  HCT 27.3* 25.3*  MCV 82.0 84.8  PLT 168 156   Cardiac Enzymes: No results for input(s): CKTOTAL, CKMB, CKMBINDEX, TROPONINI in the last 72 hours. BNP: Invalid input(s): POCBNP D-Dimer: No results for input(s): DDIMER in the last 72 hours. Hemoglobin A1C: No results for input(s): HGBA1C in the last 72 hours. Fasting Lipid Panel: No results for input(s): CHOL, HDL, LDLCALC, TRIG, CHOLHDL, LDLDIRECT in the last 72 hours. Thyroid Function Tests: No results for input(s): TSH, T4TOTAL, T3FREE, THYROIDAB in the last 72 hours.  Invalid input(s): FREET3 Anemia Panel: No results for input(s): VITAMINB12, FOLATE, FERRITIN, TIBC, IRON, RETICCTPCT in the last 72  hours.   PHYSICAL EXAM General: Well developed, well nourished, in no acute distress HEENT:  Normocephalic and atramatic Neck:  No JVD.  Lungs: Clear bilaterally to auscultation and percussion. Heart: irregularly regular, extra beats appreciated Abdomen: Bowel sounds are positive, colostomy present Msk:  Back normal, normal gait. Normal strength and tone for age. Extremities: No clubbing, cyanosis or edema.   Neuro: Alert and oriented X 3. Psych:  Good affect, responds appropriately  ASSESSMENT AND PLAN: CHF with history of cardiomyopathy ejection fraction 42% status post colonic surgery. Doing well. Recheck EKG today to evaluate irregular cardiac exam.    Patient and plan discussed with supervising provider, Dr. Neoma Laming, who agrees with above findings.   Kelby Fam Hol[place], [place]  09/17/2015 9:03 AM

## 2015-09-17 NOTE — Progress Notes (Signed)
[place] at [place] NAME: Craig Page    MR#:  [id number]  DATE OF BIRTH:  Apr 29, 1946  SUBJECTIVE:  CHIEF COMPLAINT:  No chief complaint on file.  Complains of some pain in his abdomen on movement. Nausea. No vomiting. Overall feels weak. Urine output 2300 ML Poor appetite Wants to work with PT later  REVIEW OF SYSTEMS:  Review of Systems  Constitutional: Positive for malaise/fatigue. Negative for fever, chills, weight loss and diaphoresis.  HENT: Negative for ear discharge and sore throat.   Eyes: Negative for blurred vision, double vision and pain.  Respiratory: Positive for cough. Negative for hemoptysis, sputum production, shortness of breath and wheezing.   Cardiovascular: Negative for chest pain, palpitations, orthopnea and leg swelling.  Gastrointestinal: Positive for nausea and abdominal pain. Negative for heartburn, vomiting and blood in stool.  Genitourinary: Negative for dysuria, urgency, frequency and hematuria.  Musculoskeletal: Negative for myalgias, back pain, joint pain and falls.  Skin: Negative for itching and rash.  Neurological: Positive for weakness. Negative for dizziness and focal weakness.  Psychiatric/Behavioral: Positive for memory loss. The patient is not nervous/anxious.    DRUG ALLERGIES:  No Known Allergies VITALS:  Blood pressure 98/65, pulse 92, temperature 98.1 F (36.7 C), temperature source Oral, resp. rate 20, height 5\' 11"  (1.803 m), weight 82.555 kg (182 lb), SpO2 93 %. PHYSICAL EXAMINATION:   Physical Exam  Constitutional: He is oriented to person, place, and time and well-developed, well-nourished, and in no distress.  HENT:  Head: Normocephalic and atraumatic.  Eyes: Conjunctivae and EOM are normal. Pupils are equal, round, and reactive to light.  Neck: Normal range of motion. Neck supple. No tracheal deviation present. No thyromegaly present.  Cardiovascular: Normal rate, regular  rhythm and normal heart sounds.   Pulmonary/Chest: Effort normal and breath sounds normal. No respiratory distress. He has no wheezes. He exhibits no tenderness.  Decreased breath sounds at bases  Abdominal: Soft. Bowel sounds are normal. He exhibits distension. There is tenderness.  Colostomy bag  Genitourinary:  Foley in place.  Musculoskeletal: Normal range of motion.  Neurological: He is alert and oriented to person, place, and time. No cranial nerve deficit.  Skin: Skin is warm and dry. No rash noted.  Psychiatric: Mood and affect normal.   LABORATORY PANEL:   CBC  Recent Labs Lab 09/17/15 0448  WBC 20.0*  HGB 8.1*  HCT 25.3*  PLT 156   ------------------------------------------------------------------------------------------------------------------  Chemistries   Recent Labs Lab 09/16/15 0634 09/17/15 0448  NA 141 138  K 3.8 3.9  CL 101 98*  CO2 30 27  GLUCOSE 104* 90  BUN 48* 48*  CREATININE 4.84* 4.54*  CALCIUM 7.7* 7.9*  AST 13*  --   ALT 9*  --   ALKPHOS 204*  --   BILITOT 1.3*  --    ------------------------------------------------------------------------------------------------------------------  Cardiac Enzymes No results for input(s): TROPONINI in the last 168 hours. ------------------------------------------------------------------------------------------------------------------  RADIOLOGY:  No results found.  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 1 unit packed RBC transfused on 09/13/2015.  * Chronic systolic congestive heart failure Held Lasix and spironolactone in view of hypotension and ARF Monitor closely for symptoms and signs of fluid overload  * Hypertension BP meds held due to hypotension  * Coronary artery disease with ischemic cardiomyopathy Patient is asymptomatic continue Coreg with holding parameters  Cardiology on case.  *Tobacco abuse disorder Counseled to quit smoking this admission  All the records are reviewed and case discussed with Care Management/Social Workerr. Management plans discussed with the patient, family and they are in agreement.  CODE STATUS: full  TOTAL TIME TAKING CARE OF THIS PATIENT: 30 minutes.    Hillary Bow R M.D on 09/17/2015   Between 7am to 6pm - Pager - [phone]  After 6pm go to [email or web address] - password [email or web address] [facility]  Office  [phone]  CC: Primary care physician; [name], MD  Note: This dictation was prepared with Dragon dictation along with smaller phrase technology. Any transcriptional errors that result from this process are unintentional.

## 2015-09-17 NOTE — Progress Notes (Signed)
[facility]  ROUNDING NOTE   Subjective:  Renal function slightly improved. Cr currently 4.5  UOP 2.1 liters over the past 24 hours.   Objective:  Vital signs in last 24 hours:  Temp:  [97.5 F (36.4 C)-98.1 F (36.7 C)] 97.5 F (36.4 C) (10/11 1406) Pulse Rate:  [40-164] 40 (10/11 1406) Resp:  [19-20] 19 (10/11 1406) BP: (98-120)/(65) 98/65 mmHg (10/11 1406) SpO2:  [93 %-94 %] 94 % (10/11 1406) Weight:  [82.555 kg (182 lb)] 82.555 kg (182 lb) (10/11 0500)  Weight change:  Filed Weights   09/09/15 1402 09/17/15 0500  Weight: 74.889 kg (165 lb 1.6 oz) 82.555 kg (182 lb)    Intake/Output: I/O last 3 completed shifts: In: 1389.2 [P.O.:240; I.V.:1049.2; IV Piggyback:100] Out: 3846 [Urine:3050; Stool:500]   Intake/Output this shift:  Total I/O In: 0  Out: 900 [Urine:700; Stool:200]  Physical Exam: General: NAD  Head: Normocephalic, atraumatic. moist mucosal membranes  Eyes: Anicteric  Neck: Supple, trachea midline  Lungs:  Clear to auscultation normal effort  Heart: Regular rate and rhythm  Abdomen:  Soft, nontender, +right upper quadrant ostomy   Extremities:  no peripheral edema.  Neurologic: Nonfocal, moving all four extremities  Skin: No lesions  GU foley    Basic Metabolic Panel:  Recent Labs Lab 09/13/15 0604 09/14/15 0927 09/15/15 0524 09/16/15 0634 09/17/15 0448  NA 136 137 140 141 138  K 4.2 3.7 3.6 3.8 3.9  CL 113* 105 102 101 98*  CO2 17* 22 27 30 27   GLUCOSE 79 134* 101* 104* 90  BUN 48* 48* 47* 48* 48*  CREATININE 5.46* 5.38* 5.17* 4.84* 4.54*  CALCIUM 7.4* 7.7* 7.7* 7.7* 7.9*  PHOS  --  4.7*  --   --   --     Liver Function Tests:  Recent Labs Lab 09/11/15 0433 09/12/15 0440 09/13/15 0604 09/14/15 0927 09/16/15 0634  AST 11* 11* 12*  --  13*  ALT 6* 7* 6*  --  9*  ALKPHOS 147* 150* 160*  --  204*  BILITOT 0.8 1.1 1.3*  --  1.3*  PROT 3.8* 3.9* 4.5*  --  4.2*  ALBUMIN 1.9* 1.8* 2.9* 2.7* 2.2*   No results for  input(s): LIPASE, AMYLASE in the last 168 hours. No results for input(s): AMMONIA in the last 168 hours.  CBC:  Recent Labs Lab 09/12/15 0440 09/13/15 0604 09/14/15 0927 09/16/15 0634 09/17/15 0448  WBC 20.5* 18.1* 20.6* 21.5* 20.0*  NEUTROABS 16.3* 14.3* 16.1* 19.6* 17.4*  HGB 8.1* 7.2* 9.1* 9.1* 8.1*  HCT 25.3* 21.9* 28.3* 27.3* 25.3*  MCV 80.8 81.0 81.7 82.0 84.8  PLT 194 173 171 168 156    Cardiac Enzymes: No results for input(s): CKTOTAL, CKMB, CKMBINDEX, TROPONINI in the last 168 hours.  BNP: Invalid input(s): POCBNP  CBG: No results for input(s): GLUCAP in the last 168 hours.  Microbiology: Results for orders placed or performed during the hospital encounter of 09/09/15  Culture, blood (routine x 2)     Status: None   Collection Time: 09/09/15  2:59 PM  Result Value Ref Range Status   Specimen Description BLOOD RIGHT ASSIST CONTROL  Final   Special Requests AER 10ML ANA 10ML  Final   Culture NO GROWTH 5 DAYS  Final   Report Status 09/14/2015 FINAL  Final  Culture, blood (routine x 2)     Status: None   Collection Time: 09/09/15  3:38 PM  Result Value Ref Range Status   Specimen  Description BLOOD RIGHT ARM  Final   Special Requests AER 10ML ANA 10ML  Final   Culture NO GROWTH 5 DAYS  Final   Report Status 09/14/2015 FINAL  Final  Urine culture     Status: None   Collection Time: 09/09/15  6:34 PM  Result Value Ref Range Status   Specimen Description URINE, CLEAN CATCH  Final   Special Requests NONE  Final   Culture NO GROWTH 1 DAY  Final   Report Status 09/11/2015 FINAL  Final    Coagulation Studies: No results for input(s): LABPROT, INR in the last 72 hours.  Urinalysis: No results for input(s): COLORURINE, LABSPEC, PHURINE, GLUCOSEU, HGBUR, BILIRUBINUR, KETONESUR, PROTEINUR, UROBILINOGEN, NITRITE, LEUKOCYTESUR in the last 72 hours.  Invalid input(s): APPERANCEUR    Imaging: No results found.   Medications:   . 0.45 % NaCl with KCl 20 mEq /  L 50 mL/hr at 09/17/15 0705   . amiodarone  200 mg Oral Daily  . ciprofloxacin  500 mg Oral Q18H  . famotidine  20 mg Oral Daily  . feeding supplement (ENSURE ENLIVE)  237 mL Oral TID WC  . metroNIDAZOLE  500 mg Oral 3 times per day  . pantoprazole  40 mg Oral Daily  . sodium chloride  10-40 mL Intracatheter Q12H   acetaminophen **OR** acetaminophen, heparin lock flush, heparin lock flush, morphine injection, ondansetron (ZOFRAN) IVPB [facility] +/- dexamethasone, ondansetron, oxyCODONE, simethicone, sodium chloride, sodium chloride  Assessment/ Plan:  [name] is a 69 y.o.white male with coronary artery disease, congestive heart failure, hypertension, hyperlipidemia, follicular lymphoma who was admitted to [facility] on 09/09/2015 for acute renal failure. Colonic obstruction   1. Acute Renal Failure: most likely ATN.  -Cr trending down slowly, original UA reviewed, no proteinuria noted.   -Cr down to 4.5, good UOP noted, continue IVF hydration for now.     2. Hypertension: BP 98/65, pt off anithypertensives at this time.  3. Anemia: with persistent leukocytosis. hgb down to 8.1, would continue to monitor, transfuse for hgb of 7 or less, avoid epogen given malignancy.  4. Chronic Systolic Congestive Heart Failure: not acute exacerbation. Monitor volume status. EF 35-40% on echo from 10/2014. Currently holding diuretics.     LOS: 8 Craig Page 10/11/20163:52 PM

## 2015-09-17 NOTE — Progress Notes (Signed)
Patient ID: Craig Page, male   DOB: 1946/06/01, 69 y.o.   MRN: [id number] Colostomy is functioning well. No n/v. Tolerating po. Good u/o and Creat slowly trending down. Path- not final yet but it appears to be colon primary with medullary features.

## 2015-09-17 NOTE — Plan of Care (Signed)
Problem: Discharge Progression Outcomes Goal: Other Discharge Outcomes/Goals Outcome: Progressing Plan of care progress to goals: 1. C/o back pain relieved by PRN Morphine  2. Hemodynamically:             -VSS, afebrile              -IVF infusing as ordered              -colostomy beefy red stoma w/ dark loose stool              -foley catheter for critical I/O, Creatinine & BUN remain elevated  3. Tolerating soft diet, poor intake though  4. High fall risk. Very weak & stiff requiring assistance to move in bed. Bed alarm on, hourly rounding. Pt understands how to use call system for assistance.

## 2015-09-18 ENCOUNTER — Encounter: Payer: Self-pay | Admitting: General Surgery

## 2015-09-18 ENCOUNTER — Inpatient Hospital Stay: Payer: Commercial Managed Care - HMO

## 2015-09-18 DIAGNOSIS — C787 Secondary malignant neoplasm of liver and intrahepatic bile duct: Secondary | ICD-10-CM

## 2015-09-18 DIAGNOSIS — C189 Malignant neoplasm of colon, unspecified: Principal | ICD-10-CM

## 2015-09-18 LAB — BASIC METABOLIC PANEL
Anion gap: 10 (ref 5–15)
BUN: 50 mg/dL — ABNORMAL HIGH (ref 6–20)
CHLORIDE: 104 mmol/L (ref 101–111)
CO2: 27 mmol/L (ref 22–32)
CREATININE: 4.44 mg/dL — AB (ref 0.61–1.24)
Calcium: 7.8 mg/dL — ABNORMAL LOW (ref 8.9–10.3)
GFR calc non Af Amer: 12 mL/min — ABNORMAL LOW (ref >60)
GFR, EST AFRICAN AMERICAN: 14 mL/min — AB (ref >60)
Glucose, Bld: 88 mg/dL (ref 65–99)
POTASSIUM: 3.9 mmol/L (ref 3.5–5.1)
SODIUM: 141 mmol/L (ref 135–145)

## 2015-09-18 NOTE — Progress Notes (Signed)
Late entry on 09/18/15 for 09/16/15  CSW followed up with Pt and his family regarding SNF at dc. Pt alone in room. Daughter-in-law, Craig Page had just left the room. Pt initially stated that he didn't want to talk about it when CSW first walked into the room, but CSW did get him to engage a little bit before leaving to call his wife per his request. Pt stated that if he cannot walk at discharge than he will have to go to snf but that he thinks he will be more mobile by the time he discharges.  CSW spoke to Pt's daughter-in-law and then to Pt's wife Craig Page on the phone. Pt's wife works 2 jobs and is not home to take care of Pt. She shared that she feels like they have no options and that he will need to go to skilled nursing. Pt's wife shared that she was under the impression that there would be little to nothing that can be done for cancer treatment but that they are waiting for the pathology. CSW spoke with Pt's wife and daughter-in-law about going to skilled while those decisions are made especially if Pt's goal is to gain strength to go home, even a week in skilled nursing could make a difference in mobility allowing the patient to go home in better shape. Pt does seem depressed already and depression could worsen at SNF if he has difficulty adjusting to the idea of short-term rehab.   Pt's insurance requires auth for snf based on participation in therapies.   CSW reviewed SNF options with wife and will follow up with family for SNF selection. They are hoping for [facility] which did not offer at this time, likely due to cancer diagnoses and assumption of treatment costs.  Toma Copier, [place]

## 2015-09-18 NOTE — Progress Notes (Signed)
[facility]  ROUNDING NOTE   Subjective:  Pt seen at bedside. Cr slowly coming down, currently 4.4. UOP 1.2 liters. Foley taken out.   Objective:  Vital signs in last 24 hours:  Temp:  [97.5 F (36.4 C)-98.7 F (37.1 C)] 98.4 F (36.9 C) (10/12 0522) Pulse Rate:  [40-99] 99 (10/12 0522) Resp:  [18-19] 18 (10/12 0522) BP: (98-107)/(58-69) 102/69 mmHg (10/12 0522) SpO2:  [92 %-96 %] 92 % (10/12 0522)  Weight change:  Filed Weights   09/09/15 1402 09/17/15 0500  Weight: 74.889 kg (165 lb 1.6 oz) 82.555 kg (182 lb)    Intake/Output: I/O last 3 completed shifts: In: 576.7 [I.V.:576.7] Out: 2800 [Urine:2500; Stool:300]   Intake/Output this shift:  Total I/O In: -  Out: 250 [Urine:250]  Physical Exam: General: NAD  Head: Normocephalic, atraumatic. moist mucosal membranes  Eyes: Anicteric  Neck: Supple, trachea midline  Lungs:  Clear to auscultation normal effort  Heart: Regular rate and rhythm  Abdomen:  Soft, nontender, +right upper quadrant ostomy   Extremities: no peripheral edema.  Neurologic: Nonfocal, moving all four extremities  Skin: No lesions  GU foley    Basic Metabolic Panel:  Recent Labs Lab 09/14/15 0927 09/15/15 0524 09/16/15 0634 09/17/15 0448 09/18/15 0350  NA 137 140 141 138 141  K 3.7 3.6 3.8 3.9 3.9  CL 105 102 101 98* 104  CO2 22 27 30 27 27   GLUCOSE 134* 101* 104* 90 88  BUN 48* 47* 48* 48* 50*  CREATININE 5.38* 5.17* 4.84* 4.54* 4.44*  CALCIUM 7.7* 7.7* 7.7* 7.9* 7.8*  PHOS 4.7*  --   --   --   --     Liver Function Tests:  Recent Labs Lab 09/12/15 0440 09/13/15 0604 09/14/15 0927 09/16/15 0634  AST 11* 12*  --  13*  ALT 7* 6*  --  9*  ALKPHOS 150* 160*  --  204*  BILITOT 1.1 1.3*  --  1.3*  PROT 3.9* 4.5*  --  4.2*  ALBUMIN 1.8* 2.9* 2.7* 2.2*   No results for input(s): LIPASE, AMYLASE in the last 168 hours. No results for input(s): AMMONIA in the last 168 hours.  CBC:  Recent Labs Lab  09/12/15 0440 09/13/15 0604 09/14/15 0927 09/16/15 0634 09/17/15 0448  WBC 20.5* 18.1* 20.6* 21.5* 20.0*  NEUTROABS 16.3* 14.3* 16.1* 19.6* 17.4*  HGB 8.1* 7.2* 9.1* 9.1* 8.1*  HCT 25.3* 21.9* 28.3* 27.3* 25.3*  MCV 80.8 81.0 81.7 82.0 84.8  PLT 194 173 171 168 156    Cardiac Enzymes: No results for input(s): CKTOTAL, CKMB, CKMBINDEX, TROPONINI in the last 168 hours.  BNP: Invalid input(s): POCBNP  CBG: No results for input(s): GLUCAP in the last 168 hours.  Microbiology: Results for orders placed or performed during the hospital encounter of 09/09/15  Culture, blood (routine x 2)     Status: None   Collection Time: 09/09/15  2:59 PM  Result Value Ref Range Status   Specimen Description BLOOD RIGHT ASSIST CONTROL  Final   Special Requests AER 10ML ANA 10ML  Final   Culture NO GROWTH 5 DAYS  Final   Report Status 09/14/2015 FINAL  Final  Culture, blood (routine x 2)     Status: None   Collection Time: 09/09/15  3:38 PM  Result Value Ref Range Status   Specimen Description BLOOD RIGHT ARM  Final   Special Requests AER 10ML ANA 10ML  Final   Culture NO GROWTH 5 DAYS  Final  Report Status 09/14/2015 FINAL  Final  Urine culture     Status: None   Collection Time: 09/09/15  6:34 PM  Result Value Ref Range Status   Specimen Description URINE, CLEAN CATCH  Final   Special Requests NONE  Final   Culture NO GROWTH 1 DAY  Final   Report Status 09/11/2015 FINAL  Final    Coagulation Studies: No results for input(s): LABPROT, INR in the last 72 hours.  Urinalysis: No results for input(s): COLORURINE, LABSPEC, PHURINE, GLUCOSEU, HGBUR, BILIRUBINUR, KETONESUR, PROTEINUR, UROBILINOGEN, NITRITE, LEUKOCYTESUR in the last 72 hours.  Invalid input(s): APPERANCEUR    Imaging: US Renal  09/18/2015  CLINICAL DATA:  Acute renal failure EXAM: RENAL / URINARY TRACT ULTRASOUND COMPLETE COMPARISON:  09/09/2015 FINDINGS: Right Kidney: Length: 11.3 cm. Echogenicity within normal  limits. No mass or hydronephrosis visualized. Left Kidney: Length: 11.5 cm. Some scarring is noted in the lower pole of left kidney similar to that seen on prior CT. Bladder: Appears normal for degree of bladder distention. Ascites is noted similar to that seen on recent CT examination. IMPRESSION: No acute renal abnormality noted. Repair of ascites similar to that seen on prior CT Electronically Signed   By: Craig Page M.D.   On: 09/18/2015 12:24     Medications:   . 0.45 % NaCl with KCl 20 mEq / L 50 mL/hr at 09/17/15 1729   . amiodarone  200 mg Oral Daily  . ciprofloxacin  500 mg Oral Q18H  . famotidine  20 mg Oral Daily  . feeding supplement (ENSURE ENLIVE)  237 mL Oral TID WC  . metroNIDAZOLE  500 mg Oral 3 times per day  . pantoprazole  40 mg Oral Daily  . sodium chloride  10-40 mL Intracatheter Q12H   acetaminophen **OR** acetaminophen, heparin lock flush, heparin lock flush, morphine injection, ondansetron (ZOFRAN) IVPB [facility] +/- dexamethasone, ondansetron, oxyCODONE, simethicone, sodium chloride, sodium chloride  Assessment/ Plan:  [name] is a 69 y.o.white male with coronary artery disease, congestive heart failure, hypertension, hyperlipidemia, follicular lymphoma who was admitted to [facility] on 09/09/2015 for acute renal failure. Colonic obstruction   1. Acute Renal Failure: most likely ATN. Cr trending down slowly, original UA showed no proteinuria. -Cr trending down slowly, currently down to 4.4, renal US done today and negative for hydronephrosis.    2. Hypertension: BP 102/69, off anithypertensives.  3. Anemia: with persistent leukocytosis. hgb yesterday was 8.1, avoiding epogen given malignancy.  4. Chronic Systolic Congestive Heart Failure: not acute exacerbation. Monitor volume status. EF 35-40% on echo from 10/2014. Currently holding diuretics.     LOS: 9 Craig Page 10/12/20161:50 PM

## 2015-09-18 NOTE — Progress Notes (Signed)
[place] at [place] NAME: Craig Page    MR#:  [id number]  DATE OF BIRTH:  [birth year]  SUBJECTIVE:  CHIEF COMPLAINT:  No chief complaint on file.  Complains of some pain in his abdomen on movement. Nausea. No vomiting. Foley removed Worked with PT yesterday and walker to the door.  REVIEW OF SYSTEMS:  Review of Systems  Constitutional: Positive for malaise/fatigue. Negative for fever, chills, weight loss and diaphoresis.  HENT: Negative for ear discharge and sore throat.   Eyes: Negative for blurred vision, double vision and pain.  Respiratory: Negative for cough, hemoptysis, sputum production, shortness of breath and wheezing.   Cardiovascular: Negative for chest pain, palpitations, orthopnea and leg swelling.  Gastrointestinal: Positive for abdominal pain. Negative for heartburn, nausea, vomiting and blood in stool.  Genitourinary: Negative for dysuria, urgency, frequency and hematuria.  Musculoskeletal: Negative for myalgias, back pain, joint pain and falls.  Skin: Negative for itching and rash.  Neurological: Positive for weakness. Negative for dizziness and focal weakness.  Psychiatric/Behavioral: Negative for memory loss. The patient is not nervous/anxious.    DRUG ALLERGIES:  No Known Allergies VITALS:  Blood pressure 102/69, pulse 99, temperature 98.4 F (36.9 C), temperature source Oral, resp. rate 18, height 5\' 11"  (1.803 m), weight 82.555 kg (182 lb), SpO2 92 %. PHYSICAL EXAMINATION:   Physical Exam  Constitutional: He is oriented to person, place, and time and well-developed, well-nourished, and in no distress.  HENT:  Head: Normocephalic and atraumatic.  Eyes: Conjunctivae and EOM are normal. Pupils are equal, round, and reactive to light.  Neck: Normal range of motion. Neck supple. No tracheal deviation present. No thyromegaly present.  Cardiovascular: Normal rate, regular rhythm and normal heart sounds.    Pulmonary/Chest: Effort normal and breath sounds normal. No respiratory distress. He has no wheezes. He exhibits no tenderness.  Decreased breath sounds at bases  Abdominal: Soft. Bowel sounds are normal. He exhibits distension. There is tenderness.  Colostomy bag  Genitourinary:  Foley in place.  Musculoskeletal: Normal range of motion.  Neurological: He is alert and oriented to person, place, and time. No cranial nerve deficit.  Skin: Skin is warm and dry. No rash noted.  Psychiatric: Mood and affect normal.   LABORATORY PANEL:   CBC  Recent Labs Lab 09/17/15 0448  WBC 20.0*  HGB 8.1*  HCT 25.3*  PLT 156   ------------------------------------------------------------------------------------------------------------------  Chemistries   Recent Labs Lab 09/16/15 0634  09/18/15 0350  NA 141  < > 141  K 3.8  < > 3.9  CL 101  < > 104  CO2 30  < > 27  GLUCOSE 104*  < > 88  BUN 48*  < > 50*  CREATININE 4.84*  < > 4.44*  CALCIUM 7.7*  < > 7.8*  AST 13*  --   --   ALT 9*  --   --   ALKPHOS 204*  --   --   BILITOT 1.3*  --   --   < > = values in this interval not displayed. ------------------------------------------------------------------------------------------------------------------  Cardiac Enzymes No results for input(s): TROPONINI in the last 168 hours. ------------------------------------------------------------------------------------------------------------------  RADIOLOGY:  No results found.  ASSESSMENT AND PLAN:   Active Problems:   Colonic obstruction (HCC)   Peritoneal carcinomatosis (HCC)   Colonic mass   Abdominal distension  * Acute renal failure probably secondary to ATN - slowly improving Avoid nephrotoxins Nephrology following  Good urine Output US renal  today. Discussed with Dr. Holley Raring. Patient need continue inpatient care for his ARF. Not a candidate for HD presently.  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 1 unit packed RBC transfused on 09/13/2015.  * Chronic systolic congestive heart failure Held Lasix and spironolactone in view of hypotension and ARF Monitor closely for symptoms and signs of fluid overload  * Hypertension BP meds held due to hypotension  * Coronary artery disease with ischemic cardiomyopathy Patient is asymptomatic continue Coreg with holding parameters  Cardiology on case.  *Tobacco abuse disorder Counseled to quit smoking this admission  All the records are reviewed and case discussed with Care Management/Social Workerr. Management plans discussed with the patient, family and they are in agreement.  CODE STATUS: full  TOTAL TIME TAKING CARE OF THIS PATIENT: 30 minutes.   WILL SIGN OFF. PLEASE CALL WITH QUESTIONS OR IF WE CAN HELP. THANKS FOR THE CONSULT   Hillary Bow R M.D on 09/18/2015   Between 7am to 6pm - Pager - [phone]  After 6pm go to [email or web address] - password [email or web address] [facility]  Office  [phone]  CC: Primary care physician; Craig Doe, MD  Note: This dictation was prepared with Dragon dictation along with smaller phrase technology. Any transcriptional errors that result from this process are unintentional.

## 2015-09-18 NOTE — Progress Notes (Signed)
   SUBJECTIVE: Pt states he is feeling stronger, no CP or palpitations.    Filed Vitals:   09/17/15 1406 09/17/15 1713 09/17/15 2102 09/18/15 0522  BP: 98/65  107/58 102/69  Pulse: 40 80 87 99  Temp: 97.5 F (36.4 C)  98.7 F (37.1 C) 98.4 F (36.9 C)  TempSrc: Oral  Oral Oral  Resp: 19  18 18   Height:      Weight:      SpO2: 94%  96% 92%    Intake/Output Summary (Last 24 hours) at 09/18/15 1144 Last data filed at 09/18/15 0952  Gross per 24 hour  Intake 576.67 ml  Output   1650 ml  Net -1073.33 ml    LABS: Basic Metabolic Panel:  Recent Labs  09/17/15 0448 09/18/15 0350  NA 138 141  K 3.9 3.9  CL 98* 104  CO2 27 27  GLUCOSE 90 88  BUN 48* 50*  CREATININE 4.54* 4.44*  CALCIUM 7.9* 7.8*   Liver Function Tests:  Recent Labs  09/16/15 0634  AST 13*  ALT 9*  ALKPHOS 204*  BILITOT 1.3*  PROT 4.2*  ALBUMIN 2.2*   No results for input(s): LIPASE, AMYLASE in the last 72 hours. CBC:  Recent Labs  09/16/15 0634 09/17/15 0448  WBC 21.5* 20.0*  NEUTROABS 19.6* 17.4*  HGB 9.1* 8.1*  HCT 27.3* 25.3*  MCV 82.0 84.8  PLT 168 156   Cardiac Enzymes: No results for input(s): CKTOTAL, CKMB, CKMBINDEX, TROPONINI in the last 72 hours. BNP: Invalid input(s): POCBNP D-Dimer: No results for input(s): DDIMER in the last 72 hours. Hemoglobin A1C: No results for input(s): HGBA1C in the last 72 hours. Fasting Lipid Panel: No results for input(s): CHOL, HDL, LDLCALC, TRIG, CHOLHDL, LDLDIRECT in the last 72 hours. Thyroid Function Tests: No results for input(s): TSH, T4TOTAL, T3FREE, THYROIDAB in the last 72 hours.  Invalid input(s): FREET3 Anemia Panel: No results for input(s): VITAMINB12, FOLATE, FERRITIN, TIBC, IRON, RETICCTPCT in the last 72 hours.   PHYSICAL EXAM General: Well developed, well nourished, in no acute distress HEENT:  Normocephalic and atramatic Neck:  No JVD.  Lungs: Clear bilaterally to auscultation and percussion. Heart: HRRR . Normal  S1 and S2 without gallops or murmurs.  Abdomen: Bowel sounds are positive, abdomen soft and non-tender  Msk:  Back normal, normal gait. Normal strength and tone for age. Extremities: No clubbing, cyanosis or edema.   Neuro: Alert and oriented X 3. Psych:  Good affect, responds appropriately   ASSESSMENT AND PLAN: CHF with history of cardiomyopathy ejection fraction 42% status post colonic surgery. Doing well. EKG shows sinus arrhythmia with PVCs. Advise continuation of amiodarone.     Patient and plan discussed with supervising provider, Dr. Neoma Laming, who agrees with above findings.   [facility], [employer]  09/18/2015 11:44 AM

## 2015-09-18 NOTE — Progress Notes (Signed)
CSW encouraged Pt's nurses to get Pt out of bed and in chair today as Pt was alert but still not even sitting up to eat yesterday. If Pt wants to go home, he will need to be able to tolerate more mobility in the room and demonstrate some ability to care for himself. Family very supportive but limited in time at home with him due to their work schedules.  CSW left message with [facility] to consider Pt as that is close to his wife's work. CSW spoke to attending MD and no dc today.   CSW will continue to follow.   Toma Copier, [employer]

## 2015-09-18 NOTE — Progress Notes (Signed)
Physical Therapy Treatment Patient Details Name: Craig Page MRN: [id number] DOB: 1946/03/22 Today's Date: 09/18/2015    History of Present Illness Pt is a 69 yo male admitted to the hospital s/p colostomy and biopsy     PT Comments    Pt continues to be motivated in therapy to challenge himself. Today he improved his ambulation distance once again. He again is more stable with ambulation and fatiguing less. He does still require multiple rest breaks once ambulation has completed in order to complete his therex. Due to his strength and endurance deficits, he will continue to benefit from skilled PT in order for him to eventually return home safely.   Follow Up Recommendations  SNF     Equipment Recommendations  Rolling walker with 5" wheels    Recommendations for Other Services       Precautions / Restrictions Restrictions Weight Bearing Restrictions: No Other Position/Activity Restrictions: Ostomy bag     Mobility  Bed Mobility Overal bed mobility: Needs Assistance Bed Mobility: Supine to Sit     Supine to sit: Min assist     General bed mobility comments: Pt needs assist for trunk. Good LE strength getting legs to EOB  Transfers Overall transfer level: Needs assistance Equipment used: Rolling walker (2 wheeled) Transfers: Sit to/from Stand Sit to Stand: Min assist         General transfer comment: improved functional strength today, just requiring minor assist to standing, increased time and cues for hand placement   Ambulation/Gait Ambulation/Gait assistance: Min assist Ambulation Distance (Feet): 35 Feet Assistive device: Rolling walker (2 wheeled) Gait Pattern/deviations: Step-through pattern Gait velocity: decreased Gait velocity interpretation: <1.8 ft/sec, indicative of risk for recurrent falls General Gait Details: Pt continues to improve stability and endurance with ambulation. He needs occasional assist for guidance of walker but no LOB noted.     Stairs            Wheelchair Mobility    Modified Rankin (Stroke Patients Only)       Balance Overall balance assessment: Needs assistance Sitting-balance support: No upper extremity supported Sitting balance-Leahy Scale: Good     Standing balance support: Bilateral upper extremity supported Standing balance-Leahy Scale: Good                      Cognition Arousal/Alertness: Awake/alert Behavior During Therapy: WFL for tasks assessed/performed Overall Cognitive Status: Within Functional Limits for tasks assessed                      Exercises Other Exercises Other Exercises: Pt performed bilateral therex x 15 reps at supervision for proper technique. Exercises included: scap squeeze, press up from chair, LAQ, and ankle pumps    General Comments        Pertinent Vitals/Pain Pain Assessment: 0-10 Pain Score: 3  Pain Location: abdomen Pain Intervention(s): Limited activity within patient's tolerance;Monitored during session;Premedicated before session    Home Living                      Prior Function            PT Goals (current goals can now be found in the care plan section) Acute Rehab PT Goals Patient Stated Goal: to walk PT Goal Formulation: With patient Time For Goal Achievement: 09/27/15 Potential to Achieve Goals: Good Progress towards PT goals: Progressing toward goals    Frequency  Min 2X/week    PT Plan  Current plan remains appropriate    Co-evaluation             End of Session Equipment Utilized During Treatment: Gait belt Activity Tolerance: Patient tolerated treatment well Patient left: in chair;with call bell/phone within reach;with chair alarm set     Time: 5277-8242 PT Time Calculation (min) (ACUTE ONLY): 24 min  Charges:                       G CodesJanyth Contes October 09, 2015, 11:22 AM

## 2015-09-18 NOTE — Plan of Care (Signed)
Problem: Discharge Progression Outcomes Goal: Other Discharge Outcomes/Goals Outcome: Progressing Plan of Care Progress to Goal:   Pt report pain x2 during shift. Pt central line dressing was changed this am. No other signs of distress noted. Answered all questions asked by coming nurse.

## 2015-09-18 NOTE — Care Management Important Message (Signed)
Important Message  Patient Details  Name: Craig Page MRN: [id number] Date of Birth: Dec 21, 1945   Medicare Important Message Given:   (fifth IM)    Shelbie Ammons, RN 09/18/2015, 10:34 AM

## 2015-09-18 NOTE — Progress Notes (Signed)
So was admitted in the hospital to see detail history and physical from the hospital note  [place] @ [facility] Telephone:(336) [phone]  Fax:(336) [place] OB: 06-21-1946  MR#: [id number]  LFY#:[id number]  Patient Care Team: Arlis Porta., MD as PCP - General (Family Medicine) Dionisio David, MD as Consulting Physician (Cardiology)  CHIEF COMPLAINT:   1 acute renal failure 2.  Perforated colon with contained perforation Possibility of primary colon cancer with metastases to the lymph node and liver metastases versus lymphoma recurrent disease 3.  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  Previous history of coronary artery disease being managed by cardiologist  Oncology Flowsheet 10/17/2014 10/18/2014 10/19/2014 09/10/2015  diazepam (VALIUM) PO - - 5 mg -  enoxaparin (LOVENOX) [place] 40 mg 40 mg - -  ondansetron (ZOFRAN) IV - - - -    INTERVAL HISTORY: Patient was admitted in hospital with progressive renal failure feeling week tired increasing abdominal pain.  Over last 24 hours intravenous fluid patient has developed more ascites most swelling of lower extremity with progressive increase in serum creatinine. Patient had undergone colostomy and surgery yet last night.  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  Final pathology diagnosis still not available. The patient is not in pain.  REVIEW OF SYSTEMS:    Gen. status patient still remains in the bed feeling weak and tired. Improvement in strength in comparison to the previous assessment. Abdominal pain is improved.  Abdominal distention.  Poor appetite.  Patient is making progress with physical therapy Other systems have been reviewed and reported to be negative  As per HPI. Otherwise, a complete review of systems is negatve.  PAST MEDICAL HISTORY: Past Medical History  Diagnosis Date  . CAD (coronary artery disease)     3v  . Ischemic cardiomyopathy    . Chronic systolic heart failure ([place])   . HTN (hypertension)   . HLD (hyperlipidemia)   . Follicular lymphoma ([place])   . Myocardial infarction [facility]) 1992    treated with thrombolytics/notes 10/16/2014    PAST SURGICAL HISTORY: Past Surgical History  Procedure Laterality Date  . Hernia repair    . Umbilical hernia repair  1964  . Coronary artery bypass graft N/A 10/19/2014    Procedure: CORONARY ARTERY BYPASS GRAFTING (CABG);  Surgeon: Ivin Poot, MD;  Location: [place];  Service: Open Heart Surgery;  Laterality: N/A;  Times 3 using left internal mammary artery and endoscopically harvested left saphenous vein  . Intraoperative transesophageal echocardiogram N/A 10/19/2014    Procedure: INTRAOPERATIVE TRANSESOPHAGEAL ECHOCARDIOGRAM;  Surgeon: Ivin Poot, MD;  Location: [place];  Service: Open Heart Surgery;  Laterality: N/A;  . Laparoscopy N/A 09/10/2015    Procedure: LAPAROSCOPY DIAGNOSTIC;  Surgeon: Christene Lye, MD;  Location: [facility];  Service: General;  Laterality: N/A;  . Laparotomy N/A 09/10/2015    Procedure: EXPLORATORY LAPAROTOMY;  Surgeon: Christene Lye, MD;  Location: [facility];  Service: General;  Laterality: N/A;  . Transverse loop colostomy Right 09/10/2015    Procedure: TRANSVERSE LOOP COLOSTOMY;  Surgeon: Christene Lye, MD;  Location: [facility];  Service: General;  Laterality: Right;    FAMILY HISTORY Family History  Problem Relation Age of Onset  . Heart failure Mother     deceased  . Cirrhosis Father     deceased    ADVANCED DIRECTIVES:  No flowsheet data found.  HEALTH MAINTENANCE: Social History  Substance Use  Topics  . Smoking status: Former Smoker -- 1.00 packs/day for 50 years    Types: Cigarettes    Quit date: 09/24/2014  . Smokeless tobacco: Never Used  . Alcohol Use: No      No Known Allergies  Current Facility-Administered Medications  Medication Dose Route Frequency Provider Last Rate Last Dose  . 0.45 % NaCl  with KCl 20 mEq / L infusion   Intravenous Continuous Hillary Bow, MD 50 mL/hr at 09/17/15 1729    . acetaminophen (TYLENOL) tablet 650 mg  650 mg Oral Q6H PRN Seeplaputhur Robinette Haines, MD       Or  . acetaminophen (TYLENOL) suppository 650 mg  650 mg Rectal Q6H PRN Seeplaputhur Robinette Haines, MD      . amiodarone (PACERONE) tablet 200 mg  200 mg Oral Daily Christene Lye, MD   200 mg at 09/18/15 6644  . ciprofloxacin (CIPRO) tablet 500 mg  500 mg Oral Q18H Cammie Sickle, MD   500 mg at 09/17/15 2228  . famotidine (PEPCID) tablet 20 mg  20 mg Oral Daily Lytle Butte, MD   20 mg at 09/18/15 0347  . feeding supplement (ENSURE ENLIVE) (ENSURE ENLIVE) liquid 237 mL  237 mL Oral TID WC Forest Gleason, MD   237 mL at 09/18/15 1314  . heparin lock flush 100 unit/mL  500 Units Intracatheter Daily PRN Cammie Sickle, MD      . heparin lock flush 100 unit/mL  250 Units Intracatheter PRN Cammie Sickle, MD      . metroNIDAZOLE (FLAGYL) tablet 500 mg  500 mg Oral 3 times per day Cammie Sickle, MD   500 mg at 09/18/15 0533  . morphine 2 MG/ML injection 2 mg  2 mg Intravenous Q3H PRN Christene Lye, MD   2 mg at 09/18/15 1320  . ondansetron (ZOFRAN) 4 mg in sodium chloride 0.9 % 50 mL IVPB   Intravenous Q4H PRN Forest Gleason, MD      . ondansetron (ZOFRAN) tablet 4-8 mg  4-8 mg Oral Q8H PRN Seeplaputhur Robinette Haines, MD      . oxyCODONE (Oxy IR/ROXICODONE) immediate release tablet 5-10 mg  5-10 mg Oral Q4H PRN Christene Lye, MD   10 mg at 09/13/15 1503  . pantoprazole (PROTONIX) EC tablet 40 mg  40 mg Oral Daily Seeplaputhur Robinette Haines, MD   40 mg at 09/18/15 0833  . simethicone (MYLICON) chewable tablet 80 mg  80 mg Oral TID PRN Christene Lye, MD   80 mg at 09/11/15 2035  . sodium chloride 0.9 % injection 10-40 mL  10-40 mL Intracatheter Q12H Forest Gleason, MD   10 mL at 09/18/15 0353  . sodium chloride 0.9 % injection 10-40 mL  10-40 mL Intracatheter PRN Forest Gleason,  MD      . sodium chloride 0.9 % injection 3 mL  3 mL Intracatheter PRN Cammie Sickle, MD        OBJECTIVE:  Filed Vitals:   09/18/15 1450  BP: 107/59  Pulse: 89  Temp: 98.2 F (36.8 C)  Resp: 20     Body mass index is 25.4 kg/(m^2).    ECOG FS:2 - Symptomatic, <50% confined to bed  PHYSICAL EXAM: Patient is lying in the bed not very uncomfortable. Performance status is 2 Lungs occasional crepitations.  Cardiac: Irregular heart sounds soft systolic murmur.  Abdomen: Swollen.  Ascites.  Palpable mass in the left upper abdominal area.  Also the present.  Lower extremity edema. Neurological system no localizing sign Skin: No rash Lymphatic system: Supraclavicular, cervical, axillary, inguinal lymph nodes are not palpable Head exam was generally normal. There was no scleral icterus or corneal arcus. Mucous membranes were moist. Colostomy has blurred at this point in time.  But patient is not acutely bleeding   LAB RESULTS:  CBC Latest Ref Rng 09/17/2015 09/16/2015  WBC 3.8 - 10.6 K/uL 20.0(H) 21.5(H)  Hemoglobin 13.0 - 18.0 g/dL 8.1(L) 9.1(L)  Hematocrit 40.0 - 52.0 % 25.3(L) 27.3(L)  Platelets 150 - 440 K/uL 156 168    Admission on 09/09/2015  No results displayed because visit has over 200 results.         STUDIES: Ct Abdomen Pelvis Wo Contrast  09/09/2015  CLINICAL DATA:  Initial encounter for increasing left upper quadrant pain in a patient with history of lymphoma. 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 EXAM: CT ABDOMEN AND PELVIS WITHOUT CONTRAST TECHNIQUE: Multidetector CT imaging of the abdomen and pelvis was performed following the standard protocol without IV contrast. COMPARISON:  08/29/2015. FINDINGS: Lower chest: Emphysema with chronic interstitial changes noted in the lung bases. A posterior focus of collapse/consolidation is seen in the left lower lobe. 2.1 cm nodular pleural-based lesion is seen at the  dome of the left hemidiaphragm. Hepatobiliary: Nodularity along the anterior liver margin raises the question of cirrhosis. There is a new 2.2 cm low-density lesion in the lateral segment left liver (image 13 series 2). A new 2.4 cm lesion is seen in the inferior right liver (image 30). These are suspicious for metastatic disease given the rapid interval appearance. There is no evidence for gallstones, gallbladder wall thickening, or pericholecystic fluid. No intrahepatic or extrahepatic biliary dilation. Pancreas: No focal mass lesion. No dilatation of the main duct. No intraparenchymal cyst. No peripancreatic edema. Spleen: No splenomegaly. No focal mass lesion. Adrenals/Urinary Tract: Right adrenal gland is unremarkable. 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 Kidneys are unremarkable. No evidence for hydroureter. The urinary bladder appears normal for the degree of distention. Stomach/Bowel: Tiny hiatal hernia noted. Stomach otherwise unremarkable. No small bowel wall thickening. No small bowel dilatation. The terminal ileum is normal. The appendix is normal. Large lesion involving the splenic flexure of the colon again noted. No evidence for obstruction. 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 Small nodules are seen along the margin of the large splenic flexure lesion with a conglomeration of lymph nodes identified in the central small bowel mesentery measuring 2.9 x 3.4 cm. Reproductive: Prostate gland is mildly enlarged. Other: Small volume intraperitoneal free fluid is evident. Musculoskeletal: Bone windows reveal no worrisome lytic or sclerotic osseous lesions. IMPRESSION: 1. No substantial change in the large necrotic/perforated splenic flexure mass with adjacent nodularity and central mesenteric lymphadenopathy. 2. Interval development of hypo attenuating lesions in the liver parenchyma, suspicious for metastatic disease. 3. Interval development of small  volume intraperitoneal ascites 4. 3.9 cm abdominal aortic aneurysm Electronically Signed   By: Misty Stanley M.D.   On: 09/09/2015 16:39   Dg Chest 2 View  09/14/2015  CLINICAL DATA:  Hypoxia.  Lymphoma. EXAM: CHEST  2 VIEW COMPARISON:  12/03/2014 chest radiograph. FINDINGS: Median sternotomy wires are aligned and intact. Left internal jugular central venous catheter terminates in the upper third of the superior vena cava. Lung volumes are low. The cardiomediastinal silhouette is likely stable accounting for portable technique, lordotic positioning and low lung volumes, with mild  cardiomegaly. No pneumothorax. Small left pleural effusion. No right pleural effusion. There is mild pulmonary edema. Hazy left lung base opacities likely represent atelectasis. IMPRESSION: 1. Mild congestive heart failure. 2. Small left pleural effusion. 3. Hazy left basilar lung opacities, likely atelectasis. Electronically Signed   By: Ilona Sorrel M.D.   On: 09/14/2015 12:10   US Renal  09/18/2015  CLINICAL DATA:  Acute renal failure EXAM: RENAL / URINARY TRACT ULTRASOUND COMPLETE COMPARISON:  09/09/2015 FINDINGS: Right Kidney: Length: 11.3 cm. Echogenicity within normal limits. No mass or hydronephrosis visualized. Left Kidney: Length: 11.5 cm. Some scarring is noted in the lower pole of left kidney similar to that seen on prior CT. Bladder: Appears normal for degree of bladder distention. Ascites is noted similar to that seen on recent CT examination. IMPRESSION: No acute renal abnormality noted. Repair of ascites similar to that seen on prior CT Electronically Signed   By: Inez Catalina M.D.   On: 09/18/2015 12:24   Nm Pet Image Restag (ps) Skull Base To Thigh  08/29/2015  CLINICAL DATA:  Subsequent treatment strategy for lymphoma. Abdominal pain, diarrhea, leukocytosis, and splenomegaly. EXAM: NUCLEAR MEDICINE PET SKULL BASE TO THIGH TECHNIQUE: 11.2 mCi F-18 FDG was injected intravenously. Full-ring PET imaging was  performed from the skull base to thigh after the radiotracer. CT data was obtained and used for attenuation correction and anatomic localization. FASTING BLOOD GLUCOSE:  Value: 92 mg/dl COMPARISON:  Chest CT on 10/17/2014 and PET-CT on 02/12/2014 FINDINGS: NECK No hypermetabolic lymph nodes in the neck. CHEST A 6 mm hypermetabolic mediastinal lymph node is seen just posterior to the lower thoracic esophagus on image 89 of series 3, which is hypermetabolic with SUV max of 7.1. This is new since previous study. No other hypermetabolic lymph nodes identified within the thorax. No suspicious pulmonary nodules seen on CT. Emphysema noted. ABDOMEN/PELVIS Small hypermetabolic soft tissue nodules are seen along the capsular surface of the liver and spleen. Largest is seen along the anterior aspect of the left hepatic lobe measuring 1.6 cm on image 121 of series 3, with SUV max of 8.2. There is also a tiny hypermetabolic focus in the left pelvic cul-de-sac which has an SUV max of 5.0 on image 63 of series 4. Trace amount of ascites is seen. These findings are highly suspicious with peritoneal carcinoma. A 10.2 cm mass is seen in the left upper quadrant which shows a thick hypermetabolic rim and stool or other particular debris centrally. This involves the splenic flexure of the colon and is new since previous study. SUV max measures 26.5. This is suspicious for a perforated colon carcinoma. Hypermetabolic lymphadenopathy is seen in the central abdominal mesentery medial to this mass, which measures 2.7 cm on image 145/series 3 with SUV max of 9.6. There is also a 8 mm hypermetabolic retroperitoneal lymph node in the aortocaval space on image 151/series 3, which has SUV max of 14.0. No hypermetabolic lymphadenopathy seen within the pelvis. 4.0 cm infrarenal abdominal aortic aneurysm is again seen, without significant change. SKELETON No focal hypermetabolic activity to suggest skeletal metastasis. IMPRESSION: 10 cm left upper  quadrant peripherally hypermetabolic mass with central stool or necrotic debris which involves the splenic flexure the colon. This is suspicious for perforated colon carcinoma. Hypermetabolic peritoneal soft tissue nodules in the abdomen and pelvis and minimal ascites, consistent with peritoneal metastatic disease. Mild hypermetabolic mesenteric and retroperitoneal lymphadenopathy, consistent with metastatic disease. 6 mm hypermetabolic mediastinal lymph node just posterior to the lower thoracic esophagus.  Metastatic disease cannot be excluded. Stable 4.0 cm infrarenal abdominal aortic aneurysm. Electronically Signed   By: Earle Gell M.D.   On: 08/29/2015 14:51    ASSESSMENT: 1.  Acute renal failure.  Intravenous fluid is resulting in to third spacing and not effectively raising intravascular fluid balance. Serum creatinine is improving is 4.44 Nephrology is following the patient  Will repeat renal panel tomorrow 2.  Perforated colon which is contained but leukocytosis persists.  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   pathology report is being awaited.. 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  Congestive heart failure with coronary artery disease  I had discussion with social worker and family we have 2 choices about placement for rehabilitation however they want serum creatinine to be 2 or below Or  if condition improves then going home and we are assessing home situation 3.  I also discussed situation with Dr. Elliot Dally about possibility of pulling out IV access and putting a port placement Recheck lab     Patient expressed understanding and was in agreement with this plan. He also understands that He can call clinic at any time with any questions, concerns, or complaints.    No matching staging information was found for the patient.  Forest Gleason, MD   09/18/2015 4:02  PM

## 2015-09-18 NOTE — Progress Notes (Signed)
Pallaitive Care Update  Note that on two occasions I attempted to talk with patient about wishes and also about code status.  Please refer to my consult on 10/5 and follow up visit on 10/7.  Both times, I felt the patient was not really listening or wanting to listen or able to hear details. He did not seem to be attentive to discussion.   He was consistent, however, in stating that he wanted to 'do it' --referring to CPR etc.    I have followed patients course at a distance --reviewing the notes.    It seems that he will go to SNF when that can be arranged.  He will likely be back and I will be glad to see him again, but it seems that there are no palliative needs at this time, as patient has been resistent to hearing any more negative news than he feels like he can handle.  His family also reports that he does not comprehend how bad the cancer is.   If he does not go to SNF, but instead continues to decline while hospitalized here, please do consult me again to consult if that seems to be an appropriate thing given the cirucmstances.   Colleen Can, MD

## 2015-09-18 NOTE — Plan of Care (Signed)
Problem: Discharge Progression Outcomes Goal: Other Discharge Outcomes/Goals Outcome: Progressing Pt was seen by Dr.Choski, likely to discharge to a rehab facility in 1-2 days. Creatine and BUN remain elevated, but pt is putting out adequate urine hourly. Colostomy (RLQ) intact with little out put, pain to stomach after lunch and pt was given 2mg  of morphine with noted relief. PT worked with Pt and RN asked pt if he wanted to get out of bed, which he did not  Want too. Appetite fair, pt taking in 25-50% of meals.

## 2015-09-19 LAB — BASIC METABOLIC PANEL
ANION GAP: 8 (ref 5–15)
BUN: 44 mg/dL — AB (ref 6–20)
CALCIUM: 7.6 mg/dL — AB (ref 8.9–10.3)
CO2: 25 mmol/L (ref 22–32)
Chloride: 103 mmol/L (ref 101–111)
Creatinine, Ser: 4.06 mg/dL — ABNORMAL HIGH (ref 0.61–1.24)
GFR calc Af Amer: 16 mL/min — ABNORMAL LOW (ref >60)
GFR, EST NON AFRICAN AMERICAN: 14 mL/min — AB (ref >60)
GLUCOSE: 91 mg/dL (ref 65–99)
Potassium: 3.4 mmol/L — ABNORMAL LOW (ref 3.5–5.1)
Sodium: 136 mmol/L (ref 135–145)

## 2015-09-19 LAB — CBC WITH DIFFERENTIAL/PLATELET
BASOS PCT: 1 %
Basophils Absolute: 0.1 10*3/uL (ref 0–0.1)
EOS ABS: 1.4 10*3/uL — AB (ref 0–0.7)
EOS PCT: 6 %
HEMATOCRIT: 27.4 % — AB (ref 40.0–52.0)
Hemoglobin: 8.9 g/dL — ABNORMAL LOW (ref 13.0–18.0)
LYMPHS ABS: 1.9 10*3/uL (ref 1.0–3.6)
Lymphocytes Relative: 8 %
MCH: 27.8 pg (ref 26.0–34.0)
MCHC: 32.5 g/dL (ref 32.0–36.0)
MCV: 85.4 fL (ref 80.0–100.0)
MONO ABS: 1.8 10*3/uL — AB (ref 0.2–1.0)
MONOS PCT: 7 %
Neutro Abs: 18.9 10*3/uL — ABNORMAL HIGH (ref 1.4–6.5)
Neutrophils Relative %: 78 %
PLATELETS: 214 10*3/uL (ref 150–440)
RBC: 3.21 MIL/uL — ABNORMAL LOW (ref 4.40–5.90)
RDW: 26.8 % — AB (ref 11.5–14.5)
WBC: 24.1 10*3/uL — ABNORMAL HIGH (ref 3.8–10.6)

## 2015-09-19 NOTE — Plan of Care (Signed)
Problem: Discharge Progression Outcomes Goal: Other Discharge Outcomes/Goals Outcome: Progressing Pt worked with PT again today, he walked around nursing station and was up to chair for meals, tolerated well. Rehab needed for pt at discharge, wife cannot care for him at home Colostomy bag intact, pouch changed this morning. Skin around site clean and not red Appetite good, ensure given with each meal. Pt eats 25-50% of meals Urine output adequate, uses urinal. Creatine and BUN trending down, but will not discharge until creatine is 2 or less.

## 2015-09-19 NOTE — Progress Notes (Signed)
Patient ID: Craig Page, male   DOB: March 01, 1946, 69 y.o.   MRN: [id number] Pt with no new complaints. VSS. Colostomy functioning well. Creat is slowly trending down. Oncology requested port placement for chemo. Discussed with pt and he is agreeable. Scheduled for tomorrow am.

## 2015-09-19 NOTE — Plan of Care (Signed)
Problem: Discharge Progression Outcomes Goal: Other Discharge Outcomes/Goals Outcome: Progressing Plan of Care Progress to Goal:   Pt report pain once during shift. VSS. No other signs of distress noted. Will continue to monitor.

## 2015-09-19 NOTE — Progress Notes (Signed)
   SUBJECTIVE: Pt sitting up drinking ensure, states he is feeling stronger. No CP or palpitations.    Filed Vitals:   09/18/15 0522 09/18/15 1450 09/18/15 2058 09/19/15 0457  BP: 102/69 107/59 96/64 107/61  Pulse: 99 89 90 85  Temp: 98.4 F (36.9 C) 98.2 F (36.8 C) 98.5 F (36.9 C) 98.4 F (36.9 C)  TempSrc: Oral Oral Oral Oral  Resp: 18 20 18 17   Height:      Weight:      SpO2: 92% 94% 93% 91%    Intake/Output Summary (Last 24 hours) at 09/19/15 0942 Last data filed at 09/19/15 0940  Gross per 24 hour  Intake    240 ml  Output   1300 ml  Net  -1060 ml    LABS: Basic Metabolic Panel:  Recent Labs  09/18/15 0350 09/19/15 0649  NA 141 136  K 3.9 3.4*  CL 104 103  CO2 27 25  GLUCOSE 88 91  BUN 50* 44*  CREATININE 4.44* 4.06*  CALCIUM 7.8* 7.6*   Liver Function Tests: No results for input(s): AST, ALT, ALKPHOS, BILITOT, PROT, ALBUMIN in the last 72 hours. No results for input(s): LIPASE, AMYLASE in the last 72 hours. CBC:  Recent Labs  09/17/15 0448 09/19/15 0649  WBC 20.0* 24.1*  NEUTROABS 17.4* 18.9*  HGB 8.1* 8.9*  HCT 25.3* 27.4*  MCV 84.8 85.4  PLT 156 214   Cardiac Enzymes: No results for input(s): CKTOTAL, CKMB, CKMBINDEX, TROPONINI in the last 72 hours. BNP: Invalid input(s): POCBNP D-Dimer: No results for input(s): DDIMER in the last 72 hours. Hemoglobin A1C: No results for input(s): HGBA1C in the last 72 hours. Fasting Lipid Panel: No results for input(s): CHOL, HDL, LDLCALC, TRIG, CHOLHDL, LDLDIRECT in the last 72 hours. Thyroid Function Tests: No results for input(s): TSH, T4TOTAL, T3FREE, THYROIDAB in the last 72 hours.  Invalid input(s): FREET3 Anemia Panel: No results for input(s): VITAMINB12, FOLATE, FERRITIN, TIBC, IRON, RETICCTPCT in the last 72 hours.   PHYSICAL EXAM General: Well developed, well nourished, in no acute distress HEENT: Normocephalic and atramatic Neck: No JVD.  Lungs: Clear bilaterally to  auscultation and percussion. Heart: HRRR . Normal S1 and S2 without gallops or murmurs.  Abdomen: Bowel sounds are positive, abdomen soft and non-tender  Msk: Back normal, normal gait. Normal strength and tone for age. Extremities: No clubbing, cyanosis or edema.  Neuro: Alert and oriented X 3. Psych: Good affect, responds appropriately   ASSESSMENT AND PLAN: CHF with history of cardiomyopathy ejection fraction 42% status post colonic surgery. Doing well. EKG shows sinus arrhythmia with PVCs. Advise continuation of amiodarone. Pt states he is going home soon, f/u in office given 11/14 at 10:30am.   Patient and plan discussed with supervising provider, Dr. Neoma Laming, who agrees with above findings.   Kelby Fam [place], [place]  09/19/2015 9:42 AM

## 2015-09-19 NOTE — Progress Notes (Signed)
Physical Therapy Treatment Patient Details Name: Craig Page MRN: [id number] DOB: 1945-12-26 Today's Date: 09/19/2015    History of Present Illness Pt is a 69 yo male admitted to the hospital s/p colostomy and biopsy     PT Comments    Pt making great progress with his gait performance and endurance today. He was fatigued secondary to recent pain meds, but was able to ambulate well with therapy. He still has decreased endurance and strength that will benefit from skilled PT in order for him to eventually return home safely. He is motivated to participate in therapy regardless of his level of fatigue.   Follow Up Recommendations  SNF     Equipment Recommendations  Rolling walker with 5" wheels    Recommendations for Other Services       Precautions / Restrictions Restrictions Weight Bearing Restrictions: No Other Position/Activity Restrictions: Ostomy bag     Mobility  Bed Mobility Overal bed mobility: Needs Assistance Bed Mobility: Supine to Sit     Supine to sit: Min assist     General bed mobility comments: Pt needs assist for trunk. Good LE strength getting legs to EOB  Transfers Overall transfer level: Needs assistance Equipment used: Rolling walker (2 wheeled) Transfers: Sit to/from Stand Sit to Stand: Min assist         General transfer comment: Minor assist for getting into standing. Still needs cues for hand placement  Ambulation/Gait Ambulation/Gait assistance: Min assist Ambulation Distance (Feet): 80 Feet Assistive device: Rolling walker (2 wheeled) Gait Pattern/deviations: Step-through pattern;Decreased step length - right;Decreased step length - left;Decreased stride length Gait velocity: decreased Gait velocity interpretation: Below normal speed for age/gender     Stairs            Wheelchair Mobility    Modified Rankin (Stroke Patients Only)       Balance                                    Cognition  Arousal/Alertness: Awake/alert Behavior During Therapy: WFL for tasks assessed/performed Overall Cognitive Status: Within Functional Limits for tasks assessed                      Exercises Other Exercises Other Exercises: Wished to sleep after ambulation.     General Comments        Pertinent Vitals/Pain Pain Assessment: 0-10 Pain Score: 5  Pain Location: abdomen Pain Intervention(s): Limited activity within patient's tolerance;Monitored during session;Premedicated before session    Home Living                      Prior Function            PT Goals (current goals can now be found in the care plan section) Acute Rehab PT Goals Patient Stated Goal: to walk a bit  PT Goal Formulation: With patient Time For Goal Achievement: 09/27/15 Potential to Achieve Goals: Good Progress towards PT goals: Progressing toward goals    Frequency  Min 2X/week    PT Plan Current plan remains appropriate    Co-evaluation             End of Session Equipment Utilized During Treatment: Gait belt Activity Tolerance: Patient tolerated treatment well Patient left: with call bell/phone within reach;in bed;with bed alarm set     Time: 1135-1146 PT Time Calculation (min) (ACUTE ONLY): 11 min  Charges:                       G CodesJanyth Contes 10-17-15, 1:10 PM  Janyth Contes, SPT. [phone]

## 2015-09-19 NOTE — Progress Notes (Signed)
[facility]  ROUNDING NOTE   Subjective:  Pt eating some of his meals. Cr continues to trend down quite slowly. Currently 4.0.  UOP good however.  Colostomy working well.   Objective:  Vital signs in last 24 hours:  Temp:  [97.6 F (36.4 C)-98.5 F (36.9 C)] 97.6 F (36.4 C) (10/13 1417) Pulse Rate:  [85-90] 87 (10/13 1417) Resp:  [17-20] 20 (10/13 1417) BP: (96-109)/(61-64) 109/63 mmHg (10/13 1417) SpO2:  [91 %-93 %] 93 % (10/13 1417)  Weight change:  Filed Weights   09/09/15 1402 09/17/15 0500  Weight: 74.889 kg (165 lb 1.6 oz) 82.555 kg (182 lb)    Intake/Output: I/O last 3 completed shifts: In: 240 [P.O.:240] Out: 1600 [Urine:1475; Stool:125]   Intake/Output this shift:  Total I/O In: -  Out: 275 [Urine:200; Stool:75]  Physical Exam: General: NAD  Head: Normocephalic, atraumatic. moist mucosal membranes  Eyes: Anicteric  Neck: Supple, trachea midline  Lungs:  Clear to auscultation normal effort  Heart: Regular rate and rhythm  Abdomen:  Soft, nontender, +right upper quadrant ostomy   Extremities: no peripheral edema.  Neurologic: Nonfocal, moving all four extremities  Skin: No lesions  GU foley    Basic Metabolic Panel:  Recent Labs Lab 09/14/15 0927 09/15/15 0524 09/16/15 0634 09/17/15 0448 09/18/15 0350 09/19/15 0649  NA 137 140 141 138 141 136  K 3.7 3.6 3.8 3.9 3.9 3.4*  CL 105 102 101 98* 104 103  CO2 22 27 30 27 27 25   GLUCOSE 134* 101* 104* 90 88 91  BUN 48* 47* 48* 48* 50* 44*  CREATININE 5.38* 5.17* 4.84* 4.54* 4.44* 4.06*  CALCIUM 7.7* 7.7* 7.7* 7.9* 7.8* 7.6*  PHOS 4.7*  --   --   --   --   --     Liver Function Tests:  Recent Labs Lab 09/13/15 0604 09/14/15 0927 09/16/15 0634  AST 12*  --  13*  ALT 6*  --  9*  ALKPHOS 160*  --  204*  BILITOT 1.3*  --  1.3*  PROT 4.5*  --  4.2*  ALBUMIN 2.9* 2.7* 2.2*   No results for input(s): LIPASE, AMYLASE in the last 168 hours. No results for input(s): AMMONIA in  the last 168 hours.  CBC:  Recent Labs Lab 09/13/15 0604 09/14/15 0927 09/16/15 0634 09/17/15 0448 09/19/15 0649  WBC 18.1* 20.6* 21.5* 20.0* 24.1*  NEUTROABS 14.3* 16.1* 19.6* 17.4* 18.9*  HGB 7.2* 9.1* 9.1* 8.1* 8.9*  HCT 21.9* 28.3* 27.3* 25.3* 27.4*  MCV 81.0 81.7 82.0 84.8 85.4  PLT 173 171 168 156 214    Cardiac Enzymes: No results for input(s): CKTOTAL, CKMB, CKMBINDEX, TROPONINI in the last 168 hours.  BNP: Invalid input(s): POCBNP  CBG: No results for input(s): GLUCAP in the last 168 hours.  Microbiology: Results for orders placed or performed during the hospital encounter of 09/09/15  Culture, blood (routine x 2)     Status: None   Collection Time: 09/09/15  2:59 PM  Result Value Ref Range Status   Specimen Description BLOOD RIGHT ASSIST CONTROL  Final   Special Requests AER 10ML ANA 10ML  Final   Culture NO GROWTH 5 DAYS  Final   Report Status 09/14/2015 FINAL  Final  Culture, blood (routine x 2)     Status: None   Collection Time: 09/09/15  3:38 PM  Result Value Ref Range Status   Specimen Description BLOOD RIGHT ARM  Final   Special Requests AER 10ML  ANA 10ML  Final   Culture NO GROWTH 5 DAYS  Final   Report Status 09/14/2015 FINAL  Final  Urine culture     Status: None   Collection Time: 09/09/15  6:34 PM  Result Value Ref Range Status   Specimen Description URINE, CLEAN CATCH  Final   Special Requests NONE  Final   Culture NO GROWTH 1 DAY  Final   Report Status 09/11/2015 FINAL  Final    Coagulation Studies: No results for input(s): LABPROT, INR in the last 72 hours.  Urinalysis: No results for input(s): COLORURINE, LABSPEC, PHURINE, GLUCOSEU, HGBUR, BILIRUBINUR, KETONESUR, PROTEINUR, UROBILINOGEN, NITRITE, LEUKOCYTESUR in the last 72 hours.  Invalid input(s): APPERANCEUR    Imaging: US Renal  09/18/2015  CLINICAL DATA:  Acute renal failure EXAM: RENAL / URINARY TRACT ULTRASOUND COMPLETE COMPARISON:  09/09/2015 FINDINGS: Right Kidney:  Length: 11.3 cm. Echogenicity within normal limits. No mass or hydronephrosis visualized. Left Kidney: Length: 11.5 cm. Some scarring is noted in the lower pole of left kidney similar to that seen on prior CT. Bladder: Appears normal for degree of bladder distention. Ascites is noted similar to that seen on recent CT examination. IMPRESSION: No acute renal abnormality noted. Repair of ascites similar to that seen on prior CT Electronically Signed   By: Inez Catalina M.D.   On: 09/18/2015 12:24     Medications:   . 0.45 % NaCl with KCl 20 mEq / L 50 mL/hr at 09/17/15 1729   . amiodarone  200 mg Oral Daily  . famotidine  20 mg Oral Daily  . feeding supplement (ENSURE ENLIVE)  237 mL Oral TID WC  . pantoprazole  40 mg Oral Daily  . sodium chloride  10-40 mL Intracatheter Q12H   acetaminophen **OR** acetaminophen, heparin lock flush, heparin lock flush, morphine injection, ondansetron (ZOFRAN) IVPB [facility] +/- dexamethasone, ondansetron, oxyCODONE, simethicone, sodium chloride, sodium chloride  Assessment/ Plan:  [name] is a 69 y.o.white male with coronary artery disease, congestive heart failure, hypertension, hyperlipidemia, follicular lymphoma who was admitted to [facility] on 09/09/2015 for acute renal failure. Colonic obstruction   1. Acute Renal Failure: most likely ATN. Cr trending down slowly, original UA showed no proteinuria. -  Cr continues to trend down slowly, continue gentle hydration, follow Cr trend for now.    2. Hypertension: BP 107/59, off antihypertensives.   3. Anemia: with persistent leukocytosis. hgb up to 8.9, no epogen given malignancy.  4. Chronic Systolic Congestive Heart Failure: Has tolerated IVFs well with out signs congestive heart failure.     LOS: [place], Wynnie Pacetti 10/13/20163:27 PM

## 2015-09-19 NOTE — Progress Notes (Signed)
So was admitted in the hospital to see detail history and physical from the hospital note  [place] @ [facility] [phone]  Fax:([phone]) [place] OB: [birth year]  MR#: [id number]  HQI#:[id number]  Patient Care Team: Arlis Porta., MD as PCP - General (Family Medicine) Dionisio David, MD as Consulting Physician (Cardiology)  CHIEF COMPLAINT:   1 acute renal failure 2.  Perforated colon with contained perforation Possibility of primary colon cancer with metastases to the lymph node and liver metastases versus lymphoma recurrent disease 3.  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  Previous history of coronary artery disease being managed by cardiologist  Oncology Flowsheet 10/17/2014 10/18/2014 10/19/2014 09/10/2015  diazepam (VALIUM) PO - - 5 mg -  enoxaparin (LOVENOX) [place] 40 mg 40 mg - -  ondansetron (ZOFRAN) IV - - - -    INTERVAL HISTORY: He  is feeling somewhat stronger.  Making progress with physiotherapy.  No abdominal pain.  No nausea.  No vomiting. REVIEW OF SYSTEMS:    Gen. status patient still remains in the bed feeling weak and tired. Improvement in strength in comparison to the previous assessment. Abdominal pain is improved.  Abdominal distention.  Poor appetite.  Patient is making progress with physical therapy Other systems have been reviewed and reported to be negative  As per HPI. Otherwise, a complete review of systems is negatve.  PAST MEDICAL HISTORY: Past Medical History  Diagnosis Date  . CAD (coronary artery disease)     3v  . Ischemic cardiomyopathy   . Chronic systolic heart failure ([place])   . HTN (hypertension)   . HLD (hyperlipidemia)   . Follicular lymphoma ([place])   . Myocardial infarction [facility]) 1992    treated with thrombolytics/notes 10/16/2014    PAST SURGICAL HISTORY: Past Surgical History  Procedure Laterality Date  . Hernia repair    . Umbilical  hernia repair  1964  . Coronary artery bypass graft N/A 10/19/2014    Procedure: CORONARY ARTERY BYPASS GRAFTING (CABG);  Surgeon: Ivin Poot, MD;  Location: [place];  Service: Open Heart Surgery;  Laterality: N/A;  Times 3 using left internal mammary artery and endoscopically harvested left saphenous vein  . Intraoperative transesophageal echocardiogram N/A 10/19/2014    Procedure: INTRAOPERATIVE TRANSESOPHAGEAL ECHOCARDIOGRAM;  Surgeon: Ivin Poot, MD;  Location: [place];  Service: Open Heart Surgery;  Laterality: N/A;  . Laparoscopy N/A 09/10/2015    Procedure: LAPAROSCOPY DIAGNOSTIC;  Surgeon: Christene Lye, MD;  Location: [facility];  Service: General;  Laterality: N/A;  . Laparotomy N/A 09/10/2015    Procedure: EXPLORATORY LAPAROTOMY;  Surgeon: Christene Lye, MD;  Location: [facility];  Service: General;  Laterality: N/A;  . Transverse loop colostomy Right 09/10/2015    Procedure: TRANSVERSE LOOP COLOSTOMY;  Surgeon: Christene Lye, MD;  Location: [facility];  Service: General;  Laterality: Right;    FAMILY HISTORY Family History  Problem Relation Age of Onset  . Heart failure Mother     deceased  . Cirrhosis Father     deceased    ADVANCED DIRECTIVES:  No flowsheet data found.  HEALTH MAINTENANCE: Social History  Substance Use Topics  . Smoking status: Former Smoker -- 1.00 packs/day for 50 years    Types: Cigarettes    Quit date: 09/24/2014  . Smokeless tobacco: Never Used  . Alcohol Use: No      No Known Allergies  Current  Facility-Administered Medications  Medication Dose Route Frequency Provider Last Rate Last Dose  . 0.45 % NaCl with KCl 20 mEq / L infusion   Intravenous Continuous Hillary Bow, MD 50 mL/hr at 09/17/15 1729    . acetaminophen (TYLENOL) tablet 650 mg  650 mg Oral Q6H PRN Seeplaputhur Robinette Haines, MD       Or  . acetaminophen (TYLENOL) suppository 650 mg  650 mg Rectal Q6H PRN Seeplaputhur Robinette Haines, MD      . amiodarone  (PACERONE) tablet 200 mg  200 mg Oral Daily Christene Lye, MD   200 mg at 09/19/15 0944  . famotidine (PEPCID) tablet 20 mg  20 mg Oral Daily Lytle Butte, MD   20 mg at 09/19/15 0944  . feeding supplement (ENSURE ENLIVE) (ENSURE ENLIVE) liquid 237 mL  237 mL Oral TID WC Forest Gleason, MD   237 mL at 09/19/15 1200  . heparin lock flush 100 unit/mL  500 Units Intracatheter Daily PRN Cammie Sickle, MD      . heparin lock flush 100 unit/mL  250 Units Intracatheter PRN Cammie Sickle, MD      . morphine 2 MG/ML injection 2 mg  2 mg Intravenous Q3H PRN Christene Lye, MD   2 mg at 09/18/15 1933  . ondansetron (ZOFRAN) 4 mg in sodium chloride 0.9 % 50 mL IVPB   Intravenous Q4H PRN Forest Gleason, MD      . ondansetron (ZOFRAN) tablet 4-8 mg  4-8 mg Oral Q8H PRN Seeplaputhur Robinette Haines, MD      . oxyCODONE (Oxy IR/ROXICODONE) immediate release tablet 5-10 mg  5-10 mg Oral Q4H PRN Christene Lye, MD   10 mg at 09/19/15 1054  . pantoprazole (PROTONIX) EC tablet 40 mg  40 mg Oral Daily Seeplaputhur Robinette Haines, MD   40 mg at 09/19/15 0944  . simethicone (MYLICON) chewable tablet 80 mg  80 mg Oral TID PRN Christene Lye, MD   80 mg at 09/11/15 2035  . sodium chloride 0.9 % injection 10-40 mL  10-40 mL Intracatheter Q12H Forest Gleason, MD   20 mL at 09/18/15 2154  . sodium chloride 0.9 % injection 10-40 mL  10-40 mL Intracatheter PRN Forest Gleason, MD      . sodium chloride 0.9 % injection 3 mL  3 mL Intracatheter PRN Cammie Sickle, MD        OBJECTIVE:  Filed Vitals:   09/19/15 1417  BP: 109/63  Pulse: 87  Temp: 97.6 F (36.4 C)  Resp: 20     Body mass index is 25.4 kg/(m^2).    ECOG FS:2 - Symptomatic, <50% confined to bed  PHYSICAL EXAM: Patient is lying in the bed not very uncomfortable. Performance status is 2 Lungs occasional crepitations.  Cardiac: Irregular heart sounds soft systolic murmur.  Abdomen: Swollen.  Ascites.  Palpable mass in the left  upper abdominal area.  Also the present.  Lower extremity edema. Neurological system no localizing sign Skin: No rash Lymphatic system: Supraclavicular, cervical, axillary, inguinal lymph nodes are not palpable Head exam was generally normal. There was no scleral icterus or corneal arcus. Mucous membranes were moist. Colostomy has blurred at this point in time.  But patient is not acutely bleeding   LAB RESULTS:  CBC Latest Ref Rng 09/19/2015 09/17/2015  WBC 3.8 - 10.6 K/uL 24.1(H) 20.0(H)  Hemoglobin 13.0 - 18.0 g/dL 8.9(L) 8.1(L)  Hematocrit 40.0 - 52.0 % 27.4(L) 25.3(L)  Platelets 150 -  440 K/uL 214 156    Admission on 09/09/2015  No results displayed because visit has over 200 results.         STUDIES: Ct Abdomen Pelvis Wo Contrast  09/09/2015  CLINICAL DATA:  Initial encounter for increasing left upper quadrant pain in a patient with history of lymphoma. 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 EXAM: CT ABDOMEN AND PELVIS WITHOUT CONTRAST TECHNIQUE: Multidetector CT imaging of the abdomen and pelvis was performed following the standard protocol without IV contrast. COMPARISON:  08/29/2015. FINDINGS: Lower chest: Emphysema with chronic interstitial changes noted in the lung bases. A posterior focus of collapse/consolidation is seen in the left lower lobe. 2.1 cm nodular pleural-based lesion is seen at the dome of the left hemidiaphragm. Hepatobiliary: Nodularity along the anterior liver margin raises the question of cirrhosis. There is a new 2.2 cm low-density lesion in the lateral segment left liver (image 13 series 2). A new 2.4 cm lesion is seen in the inferior right liver (image 30). These are suspicious for metastatic disease given the rapid interval appearance. There is no evidence for gallstones, gallbladder wall thickening, or pericholecystic fluid. No intrahepatic or extrahepatic biliary dilation. Pancreas: No focal mass lesion.  No dilatation of the main duct. No intraparenchymal cyst. No peripancreatic edema. Spleen: No splenomegaly. No focal mass lesion. Adrenals/Urinary Tract: Right adrenal gland is unremarkable. 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 Kidneys are unremarkable. No evidence for hydroureter. The urinary bladder appears normal for the degree of distention. Stomach/Bowel: Tiny hiatal hernia noted. Stomach otherwise unremarkable. No small bowel wall thickening. No small bowel dilatation. The terminal ileum is normal. The appendix is normal. Large lesion involving the splenic flexure of the colon again noted. No evidence for obstruction. 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 Small nodules are seen along the margin of the large splenic flexure lesion with a conglomeration of lymph nodes identified in the central small bowel mesentery measuring 2.9 x 3.4 cm. Reproductive: Prostate gland is mildly enlarged. Other: Small volume intraperitoneal free fluid is evident. Musculoskeletal: Bone windows reveal no worrisome lytic or sclerotic osseous lesions. IMPRESSION: 1. No substantial change in the large necrotic/perforated splenic flexure mass with adjacent nodularity and central mesenteric lymphadenopathy. 2. Interval development of hypo attenuating lesions in the liver parenchyma, suspicious for metastatic disease. 3. Interval development of small volume intraperitoneal ascites 4. 3.9 cm abdominal aortic aneurysm Electronically Signed   By: Misty Stanley M.D.   On: 09/09/2015 16:39   Dg Chest 2 View  09/14/2015  CLINICAL DATA:  Hypoxia.  Lymphoma. EXAM: CHEST  2 VIEW COMPARISON:  12/03/2014 chest radiograph. FINDINGS: Median sternotomy wires are aligned and intact. Left internal jugular central venous catheter terminates in the upper third of the superior vena cava. Lung volumes are low. The cardiomediastinal silhouette is likely stable accounting for portable technique, lordotic  positioning and low lung volumes, with mild cardiomegaly. No pneumothorax. Small left pleural effusion. No right pleural effusion. There is mild pulmonary edema. Hazy left lung base opacities likely represent atelectasis. IMPRESSION: 1. Mild congestive heart failure. 2. Small left pleural effusion. 3. Hazy left basilar lung opacities, likely atelectasis. Electronically Signed   By: Ilona Sorrel M.D.   On: 09/14/2015 12:10   US Renal  09/18/2015  CLINICAL DATA:  Acute renal failure EXAM: RENAL / URINARY TRACT ULTRASOUND COMPLETE COMPARISON:  09/09/2015 FINDINGS: Right Kidney: Length: 11.3 cm. Echogenicity within normal limits. No mass or  hydronephrosis visualized. Left Kidney: Length: 11.5 cm. Some scarring is noted in the lower pole of left kidney similar to that seen on prior CT. Bladder: Appears normal for degree of bladder distention. Ascites is noted similar to that seen on recent CT examination. IMPRESSION: No acute renal abnormality noted. Repair of ascites similar to that seen on prior CT Electronically Signed   By: Inez Catalina M.D.   On: 09/18/2015 12:24   Nm Pet Image Restag (ps) Skull Base To Thigh  08/29/2015  CLINICAL DATA:  Subsequent treatment strategy for lymphoma. Abdominal pain, diarrhea, leukocytosis, and splenomegaly. EXAM: NUCLEAR MEDICINE PET SKULL BASE TO THIGH TECHNIQUE: 11.2 mCi F-18 FDG was injected intravenously. Full-ring PET imaging was performed from the skull base to thigh after the radiotracer. CT data was obtained and used for attenuation correction and anatomic localization. FASTING BLOOD GLUCOSE:  Value: 92 mg/dl COMPARISON:  Chest CT on 10/17/2014 and PET-CT on 02/12/2014 FINDINGS: NECK No hypermetabolic lymph nodes in the neck. CHEST A 6 mm hypermetabolic mediastinal lymph node is seen just posterior to the lower thoracic esophagus on image 89 of series 3, which is hypermetabolic with SUV max of 7.1. This is new since previous study. No other hypermetabolic lymph nodes  identified within the thorax. No suspicious pulmonary nodules seen on CT. Emphysema noted. ABDOMEN/PELVIS Small hypermetabolic soft tissue nodules are seen along the capsular surface of the liver and spleen. Largest is seen along the anterior aspect of the left hepatic lobe measuring 1.6 cm on image 121 of series 3, with SUV max of 8.2. There is also a tiny hypermetabolic focus in the left pelvic cul-de-sac which has an SUV max of 5.0 on image 63 of series 4. Trace amount of ascites is seen. These findings are highly suspicious with peritoneal carcinoma. A 10.2 cm mass is seen in the left upper quadrant which shows a thick hypermetabolic rim and stool or other particular debris centrally. This involves the splenic flexure of the colon and is new since previous study. SUV max measures 26.5. This is suspicious for a perforated colon carcinoma. Hypermetabolic lymphadenopathy is seen in the central abdominal mesentery medial to this mass, which measures 2.7 cm on image 145/series 3 with SUV max of 9.6. There is also a 8 mm hypermetabolic retroperitoneal lymph node in the aortocaval space on image 151/series 3, which has SUV max of 14.0. No hypermetabolic lymphadenopathy seen within the pelvis. 4.0 cm infrarenal abdominal aortic aneurysm is again seen, without significant change. SKELETON No focal hypermetabolic activity to suggest skeletal metastasis. IMPRESSION: 10 cm left upper quadrant peripherally hypermetabolic mass with central stool or necrotic debris which involves the splenic flexure the colon. This is suspicious for perforated colon carcinoma. Hypermetabolic peritoneal soft tissue nodules in the abdomen and pelvis and minimal ascites, consistent with peritoneal metastatic disease. Mild hypermetabolic mesenteric and retroperitoneal lymphadenopathy, consistent with metastatic disease. 6 mm hypermetabolic mediastinal lymph node just posterior to the lower thoracic esophagus. Metastatic disease cannot be excluded.  Stable 4.0 cm infrarenal abdominal aortic aneurysm. Electronically Signed   By: Earle Gell M.D.   On: 08/29/2015 14:51    ASSESSMENT: 1.  Acute renal failure.  Intravenous fluid is resulting in to third spacing and not effectively raising intravascular fluid balance. Serum creatinine is improving is4.06 Nephrology is following the patient Leukocytosis is most likely secondary to necrotic tumor rather than infection.   Advanced diet to regular diet Patient will get port placement tomorrow Continue physiotherapy Decision to been made between rehabilitation  versus home.  Social worker will follow that. Dr. Cherre Blanc is covering me  tomorrow.   Patient expressed understanding and was in agreement with this plan. He also understands that He can call clinic at any time with any questions, concerns, or complaints.    No matching staging information was found for the patient.  Forest Gleason, MD   09/19/2015 5:27 PM

## 2015-09-20 ENCOUNTER — Encounter: Payer: Self-pay | Admitting: Anesthesiology

## 2015-09-20 ENCOUNTER — Inpatient Hospital Stay: Payer: Commercial Managed Care - HMO | Admitting: Registered Nurse

## 2015-09-20 ENCOUNTER — Inpatient Hospital Stay: Payer: Commercial Managed Care - HMO

## 2015-09-20 ENCOUNTER — Encounter
Admission: AD | Disposition: A | Discharge status: Skilled Nursing Facility | Payer: Self-pay | Source: Ambulatory Visit | Attending: Oncology

## 2015-09-20 DIAGNOSIS — D649 Anemia, unspecified: Secondary | ICD-10-CM

## 2015-09-20 HISTORY — PX: PORTACATH PLACEMENT: SHX2246

## 2015-09-20 LAB — BASIC METABOLIC PANEL
Anion gap: 9 (ref 5–15)
BUN: 47 mg/dL — AB (ref 6–20)
CO2: 24 mmol/L (ref 22–32)
CREATININE: 3.77 mg/dL — AB (ref 0.61–1.24)
Calcium: 8 mg/dL — ABNORMAL LOW (ref 8.9–10.3)
Chloride: 107 mmol/L (ref 101–111)
GFR, EST AFRICAN AMERICAN: 18 mL/min — AB (ref >60)
GFR, EST NON AFRICAN AMERICAN: 15 mL/min — AB (ref >60)
Glucose, Bld: 96 mg/dL (ref 65–99)
POTASSIUM: 3.5 mmol/L (ref 3.5–5.1)
SODIUM: 140 mmol/L (ref 135–145)

## 2015-09-20 LAB — CBC WITH DIFFERENTIAL/PLATELET
BASOS PCT: 1 %
Basophils Absolute: 0.1 10*3/uL (ref 0–0.1)
EOS ABS: 1.4 10*3/uL — AB (ref 0–0.7)
EOS PCT: 6 %
HCT: 30.3 % — ABNORMAL LOW (ref 40.0–52.0)
Hemoglobin: 9.9 g/dL — ABNORMAL LOW (ref 13.0–18.0)
LYMPHS ABS: 2.5 10*3/uL (ref 1.0–3.6)
Lymphocytes Relative: 10 %
MCH: 28.3 pg (ref 26.0–34.0)
MCHC: 32.8 g/dL (ref 32.0–36.0)
MCV: 86.3 fL (ref 80.0–100.0)
Monocytes Absolute: 1.8 10*3/uL — ABNORMAL HIGH (ref 0.2–1.0)
Monocytes Relative: 7 %
NEUTROS PCT: 76 %
Neutro Abs: 18.4 10*3/uL — ABNORMAL HIGH (ref 1.4–6.5)
PLATELETS: 217 10*3/uL (ref 150–440)
RBC: 3.51 MIL/uL — AB (ref 4.40–5.90)
RDW: 27.3 % — ABNORMAL HIGH (ref 11.5–14.5)
WBC: 24.2 10*3/uL — AB (ref 3.8–10.6)

## 2015-09-20 SURGERY — INSERTION, TUNNELED CENTRAL VENOUS DEVICE, WITH PORT
Anesthesia: Monitor Anesthesia Care | Wound class: Clean

## 2015-09-20 MED ORDER — LACTATED RINGERS IV SOLN
INTRAVENOUS | Status: DC | PRN
  Administered 2015-09-20: 07:00:00 via INTRAVENOUS

## 2015-09-20 MED ORDER — ENSURE ENLIVE PO LIQD
237.0000 mL | ORAL | Status: DC
  Administered 2015-09-21 – 2015-09-23 (×3): 237 mL via ORAL

## 2015-09-20 MED ORDER — BUPIVACAINE HCL (PF) 0.5 % IJ SOLN
INTRAMUSCULAR | Status: AC
  Filled 2015-09-20: qty 30

## 2015-09-20 MED ORDER — FENTANYL CITRATE (PF) 100 MCG/2ML IJ SOLN: 25.0000 ug | INTRAMUSCULAR | Status: DC | PRN

## 2015-09-20 MED ORDER — MIDAZOLAM HCL 2 MG/2ML IJ SOLN
INTRAMUSCULAR | Status: DC | PRN
  Administered 2015-09-20: 2 mg via INTRAVENOUS

## 2015-09-20 MED ORDER — PHENYLEPHRINE HCL 10 MG/ML IJ SOLN
INTRAMUSCULAR | Status: DC | PRN
  Administered 2015-09-20 (×2): 100 ug via INTRAVENOUS

## 2015-09-20 MED ORDER — LIDOCAINE HCL 1 % IJ SOLN
INTRAMUSCULAR | Status: DC | PRN
  Administered 2015-09-20: 15 mL via SUBCUTANEOUS

## 2015-09-20 MED ORDER — HEPARIN SODIUM (PORCINE) 5000 UNIT/ML IJ SOLN
INTRAMUSCULAR | Status: AC
  Filled 2015-09-20: qty 1

## 2015-09-20 MED ORDER — ONDANSETRON HCL 4 MG/2ML IJ SOLN: 4.0000 mg | Freq: Once | INTRAMUSCULAR | Status: DC | PRN

## 2015-09-20 MED ORDER — ACETAMINOPHEN 10 MG/ML IV SOLN
INTRAVENOUS | Status: AC
  Filled 2015-09-20: qty 100

## 2015-09-20 MED ORDER — LIDOCAINE HCL (PF) 1 % IJ SOLN
INTRAMUSCULAR | Status: AC
  Filled 2015-09-20: qty 30

## 2015-09-20 MED ORDER — CEFAZOLIN SODIUM-DEXTROSE 2-3 GM-% IV SOLR
2.0000 g | INTRAVENOUS | Status: AC
  Administered 2015-09-20: 2 g via INTRAVENOUS
  Filled 2015-09-20: qty 50

## 2015-09-20 MED ORDER — SODIUM CHLORIDE 0.9 % IV SOLN
INTRAVENOUS | Status: DC | PRN
  Administered 2015-09-20: 10 mL via INTRAMUSCULAR

## 2015-09-20 MED ORDER — ACETAMINOPHEN 10 MG/ML IV SOLN
INTRAVENOUS | Status: DC | PRN
  Administered 2015-09-20: 1000 mg via INTRAVENOUS

## 2015-09-20 SURGICAL SUPPLY — 27 items
BAG DECANTER FOR FLEXI CONT (MISCELLANEOUS) ×3 IMPLANT
BLADE SURG 15 STRL SS SAFETY (BLADE) ×3 IMPLANT
CANISTER SUCT 1200ML W/VALVE (MISCELLANEOUS) ×3 IMPLANT
CHLORAPREP W/TINT 26ML (MISCELLANEOUS) ×3 IMPLANT
COVER LIGHT HANDLE STERIS (MISCELLANEOUS) ×6 IMPLANT
DECANTER SPIKE VIAL GLASS SM (MISCELLANEOUS) ×6 IMPLANT
DRAPE C-ARM XRAY 36X54 (DRAPES) ×3 IMPLANT
GLOVE BIO SURGEON STRL SZ7 (GLOVE) ×3 IMPLANT
GOWN STRL REUS W/ TWL LRG LVL3 (GOWN DISPOSABLE) ×2 IMPLANT
GOWN STRL REUS W/TWL LRG LVL3 (GOWN DISPOSABLE) ×4
IV NS 500ML (IV SOLUTION) ×2
IV NS 500ML BAXH (IV SOLUTION) ×1 IMPLANT
KIT RM TURNOVER STRD PROC AR (KITS) ×3 IMPLANT
LABEL OR SOLS (LABEL) ×3 IMPLANT
LIQUID BAND (GAUZE/BANDAGES/DRESSINGS) ×3 IMPLANT
NEEDLE FILTER BLUNT 18X 1/2SAF (NEEDLE) ×2
NEEDLE FILTER BLUNT 18X1 1/2 (NEEDLE) ×1 IMPLANT
NEEDLE HYPO 25GX1X1/2 BEV (NEEDLE) ×3 IMPLANT
NS IRRIG 500ML POUR BTL (IV SOLUTION) ×3 IMPLANT
PACK PORT-A-CATH (MISCELLANEOUS) ×3 IMPLANT
PAD GROUND ADULT SPLIT (MISCELLANEOUS) ×3 IMPLANT
PORTACATH POWER 8F (Port) ×3 IMPLANT
SUT PROLENE 2 0 SH DA (SUTURE) ×3 IMPLANT
SUT VIC AB 3-0 SH 27 (SUTURE) ×2
SUT VIC AB 3-0 SH 27X BRD (SUTURE) ×1 IMPLANT
SUT VIC AB 4-0 FS2 27 (SUTURE) ×3 IMPLANT
SYR 3ML LL SCALE MARK (SYRINGE) ×3 IMPLANT

## 2015-09-20 NOTE — Care Management Important Message (Signed)
Important Message  Patient Details  Name: Craig Page MRN: [id number] Date of Birth: 05-15-1946   Medicare Important Message Given:  Yes-second notification given    [name] 09/20/2015, 2:24 PM

## 2015-09-20 NOTE — Progress Notes (Signed)
[facility]  ROUNDING NOTE   Subjective:  Renal function improving. Cr down to 3.7. Resting in bed at the moment.   Objective:  Vital signs in last 24 hours:  Temp:  [97.2 F (36.2 C)-98.7 F (37.1 C)] 97.8 F (36.6 C) (10/14 0924) Pulse Rate:  [37-105] 100 (10/14 0924) Resp:  [16-28] 19 (10/14 0900) BP: (77-122)/(47-87) 112/65 mmHg (10/14 0924) SpO2:  [90 %-100 %] 91 % (10/14 0924) Weight:  [80.287 kg (177 lb)] 80.287 kg (177 lb) (10/14 0522)  Weight change:  Filed Weights   09/09/15 1402 09/17/15 0500 09/20/15 0522  Weight: 74.889 kg (165 lb 1.6 oz) 82.555 kg (182 lb) 80.287 kg (177 lb)    Intake/Output: I/O last 3 completed shifts: In: 360 [P.O.:360] Out: 1375 [Urine:1175; Stool:200]   Intake/Output this shift:  Total I/O In: 400 [P.O.:50; I.V.:350] Out: -   Physical Exam: General: NAD  Head: Normocephalic, atraumatic. moist mucosal membranes  Eyes: Anicteric  Neck: Supple, trachea midline  Lungs:  Clear to auscultation normal effort  Heart: Regular rate and rhythm  Abdomen:  Soft, nontender, +right upper quadrant ostomy   Extremities: no peripheral edema.  Neurologic: Nonfocal, moving all four extremities  Skin: No lesions       Basic Metabolic Panel:  Recent Labs Lab 09/14/15 0927  09/16/15 0634 09/17/15 0448 09/18/15 0350 09/19/15 0649 09/20/15 0516  NA 137  < > 141 138 141 136 140  K 3.7  < > 3.8 3.9 3.9 3.4* 3.5  CL 105  < > 101 98* 104 103 107  CO2 22  < > 30 27 27 25 24   GLUCOSE 134*  < > 104* 90 88 91 96  BUN 48*  < > 48* 48* 50* 44* 47*  CREATININE 5.38*  < > 4.84* 4.54* 4.44* 4.06* 3.77*  CALCIUM 7.7*  < > 7.7* 7.9* 7.8* 7.6* 8.0*  PHOS 4.7*  --   --   --   --   --   --   < > = values in this interval not displayed.  Liver Function Tests:  Recent Labs Lab 09/14/15 0927 09/16/15 0634  AST  --  13*  ALT  --  9*  ALKPHOS  --  204*  BILITOT  --  1.3*  PROT  --  4.2*  ALBUMIN 2.7* 2.2*   No results for input(s):  LIPASE, AMYLASE in the last 168 hours. No results for input(s): AMMONIA in the last 168 hours.  CBC:  Recent Labs Lab 09/14/15 0927 09/16/15 0634 09/17/15 0448 09/19/15 0649 09/20/15 0516  WBC 20.6* 21.5* 20.0* 24.1* 24.2*  NEUTROABS 16.1* 19.6* 17.4* 18.9* 18.4*  HGB 9.1* 9.1* 8.1* 8.9* 9.9*  HCT 28.3* 27.3* 25.3* 27.4* 30.3*  MCV 81.7 82.0 84.8 85.4 86.3  PLT 171 168 156 214 217    Cardiac Enzymes: No results for input(s): CKTOTAL, CKMB, CKMBINDEX, TROPONINI in the last 168 hours.  BNP: Invalid input(s): POCBNP  CBG: No results for input(s): GLUCAP in the last 168 hours.  Microbiology: Results for orders placed or performed during the hospital encounter of 09/09/15  Culture, blood (routine x 2)     Status: None   Collection Time: 09/09/15  2:59 PM  Result Value Ref Range Status   Specimen Description BLOOD RIGHT ASSIST CONTROL  Final   Special Requests AER 10ML ANA 10ML  Final   Culture NO GROWTH 5 DAYS  Final   Report Status 09/14/2015 FINAL  Final  Culture, blood (routine x  2)     Status: None   Collection Time: 09/09/15  3:38 PM  Result Value Ref Range Status   Specimen Description BLOOD RIGHT ARM  Final   Special Requests AER 10ML ANA 10ML  Final   Culture NO GROWTH 5 DAYS  Final   Report Status 09/14/2015 FINAL  Final  Urine culture     Status: None   Collection Time: 09/09/15  6:34 PM  Result Value Ref Range Status   Specimen Description URINE, CLEAN CATCH  Final   Special Requests NONE  Final   Culture NO GROWTH 1 DAY  Final   Report Status 09/11/2015 FINAL  Final    Coagulation Studies: No results for input(s): LABPROT, INR in the last 72 hours.  Urinalysis: No results for input(s): COLORURINE, LABSPEC, PHURINE, GLUCOSEU, HGBUR, BILIRUBINUR, KETONESUR, PROTEINUR, UROBILINOGEN, NITRITE, LEUKOCYTESUR in the last 72 hours.  Invalid input(s): APPERANCEUR    Imaging: Dg Chest 1 View  09/20/2015  CLINICAL DATA:  Postop port placement EXAM: CHEST 1  VIEW COMPARISON:  09/14/2015 FINDINGS: Prior CABG. Right Port-A-Cath is in place with the tip at the cavoatrial junction. No pneumothorax Interval removal of left central line. There is cardiomegaly with vascular congestion. Left lower lobe atelectasis or infiltrate. Right lung is clear. No visible effusions. IMPRESSION: Right port placement with the tip at the cavoatrial junction. No pneumothorax. Cardiomegaly, vascular congestion. Left lower lobe atelectasis or infiltrate. Electronically Signed   By: Rolm Baptise M.D.   On: 09/20/2015 09:01   US Renal  09/18/2015  CLINICAL DATA:  Acute renal failure EXAM: RENAL / URINARY TRACT ULTRASOUND COMPLETE COMPARISON:  09/09/2015 FINDINGS: Right Kidney: Length: 11.3 cm. Echogenicity within normal limits. No mass or hydronephrosis visualized. Left Kidney: Length: 11.5 cm. Some scarring is noted in the lower pole of left kidney similar to that seen on prior CT. Bladder: Appears normal for degree of bladder distention. Ascites is noted similar to that seen on recent CT examination. IMPRESSION: No acute renal abnormality noted. Repair of ascites similar to that seen on prior CT Electronically Signed   By: Inez Catalina M.D.   On: 09/18/2015 12:24   Dg C-arm 1-60 Min-no Report  09/20/2015  CLINICAL DATA: Port placement C-ARM 1-60 MINUTES Fluoroscopy was utilized by the requesting physician.  No radiographic interpretation.     Medications:   . 0.45 % NaCl with KCl 20 mEq / L 50 mL/hr at 09/17/15 1729   . amiodarone  200 mg Oral Daily  . famotidine  20 mg Oral Daily  . feeding supplement (ENSURE ENLIVE)  237 mL Oral TID WC  . pantoprazole  40 mg Oral Daily  . sodium chloride  10-40 mL Intracatheter Q12H   acetaminophen **OR** acetaminophen, fentaNYL (SUBLIMAZE) injection, heparin lock flush, heparin lock flush, morphine injection, ondansetron (ZOFRAN) IVPB [facility] +/- dexamethasone, ondansetron (ZOFRAN) IV, ondansetron, oxyCODONE, simethicone, sodium chloride,  sodium chloride  Assessment/ Plan:  [name] is a 69 y.o.white male with coronary artery disease, congestive heart failure, hypertension, hyperlipidemia, follicular lymphoma who was admitted to [facility] on 09/09/2015 for acute renal failure. Colonic obstruction   1. Acute Renal Failure: most likely ATN. Cr trending down slowly, original UA showed no proteinuria. -  Cr trending down very slowly, currently Cr is 3.7, would continue hydration for now.    2. Hypertension: normotensive at present and off all antihypertensives.   3. Anemia: with persistent leukocytosis. hgb up to 9.9 this AM.  WBC count remains quite elevated.  Defer evaluation of this to surgery and hospitalist.   4. Chronic Systolic Congestive Heart Failure: no signs of heart failure at present.    LOS: 11 Shanyce Daris 10/14/201611:08 AM

## 2015-09-20 NOTE — Progress Notes (Signed)
Took of patient care from [place] tripp at 15:40 Patient c/o pain x1 relieved well with prn Oxycodone and slept balance of shift

## 2015-09-20 NOTE — Anesthesia Postprocedure Evaluation (Signed)
  Anesthesia Post-op Note  Patient: Craig Page  Procedure(s) Performed: Procedure(s) with comments: INSERTION PORT-A-CATH (N/A) - Right   Anesthesia type:MAC  Patient location: PACU  Post pain: Pain level controlled  Post assessment: Post-op Vital signs reviewed, Patient's Cardiovascular Status Stable, Respiratory Function Stable, Patent Airway and No signs of Nausea or vomiting  Post vital signs: Reviewed and stable  Last Vitals:  Filed Vitals:   09/20/15 1350  BP: 100/60  Pulse: 76  Temp: 36.7 C  Resp:     Level of consciousness: awake, alert  and patient cooperative  Complications: No apparent anesthesia complications

## 2015-09-20 NOTE — Anesthesia Preprocedure Evaluation (Addendum)
Anesthesia Evaluation  Patient identified by MRN, date of birth, ID band Patient awake    Reviewed: Allergy & Precautions, H&P , NPO status , Patient's Chart, lab work & pertinent test results, reviewed documented beta blocker date and time   History of Anesthesia Complications (+) history of anesthetic complications  Airway Mallampati: III  TM Distance: >3 FB Neck ROM: limited    Dental  (+) Upper Dentures, Lower Dentures   Pulmonary neg shortness of breath, former smoker,    Pulmonary exam normal breath sounds clear to auscultation       Cardiovascular Exercise Tolerance: Good hypertension, + CAD, + Past MI, + Peripheral Vascular Disease and +CHF  Normal cardiovascular exam Rhythm:regular Rate:Normal     Neuro/Psych negative neurological ROS  negative psych ROS   GI/Hepatic Neg liver ROS, Hx of colon CA, colonic mass, colostomy   Endo/Other  negative endocrine ROS  Renal/GU negative Renal ROS  negative genitourinary   Musculoskeletal negative musculoskeletal ROS (+)   Abdominal colostomy  Peds  Hematology negative hematology ROS (+) anemia ,   Anesthesia Other Findings Past Medical History:   CAD (coronary artery disease)                                  Comment:3v   Ischemic cardiomyopathy                                      Chronic systolic heart failure (HCC)                         HTN (hypertension)                                           HLD (hyperlipidemia)                                         Follicular lymphoma (HCC)                                    Myocardial infarction ([place])                     1992           Comment:treated with thrombolytics/notes 10/16/2014   Reproductive/Obstetrics negative OB ROS                            Anesthesia Physical Anesthesia Plan  ASA: IV  Anesthesia Plan: MAC and General   Post-op Pain Management:    Induction:  Intravenous  Airway Management Planned: Nasal Cannula  Additional Equipment:   Intra-op Plan:   Post-operative Plan:   Informed Consent: I have reviewed the patients History and Physical, chart, labs and discussed the procedure including the risks, benefits and alternatives for the proposed anesthesia with the patient or authorized representative who has indicated his/her understanding and acceptance.   Dental advisory given  Plan Discussed with: CRNA and Surgeon  Anesthesia Plan Comments:  Anesthesia Quick Evaluation  

## 2015-09-20 NOTE — Progress Notes (Signed)
Nutrition Follow-up   INTERVENTION:   Meals and Snacks: Cater to patient preferences. RD provided much encouragement for po intake. Medical Food Supplement Therapy: will decrease Ensure as pt reports drinking only one a day most days. Will send Magic Cup BID and a homemade milkshake as afternoon snack. Coordination of Care: will post Calorie Count for better assessment of po intake. CNA [place] aware and willing to document.   NUTRITION DIAGNOSIS:   Inadequate oral intake related to inability to eat as evidenced by NPO status  GOAL:   Patient will meet greater than or equal to 90% of their needs  MONITOR:    (Energy Intake, Electrolyte and renal Profile, Digestive System, Anthropometrics)  REASON FOR ASSESSMENT:   Malnutrition Screening Tool    ASSESSMENT:   Pt s/p Port placement this am for chemo initiation.  Diet Order:  Diet regular Room service appropriate?: Yes; Fluid consistency:: Thin    Current Nutrition: Pt NPO this am. Per pt 'I just don't have an appetite.' RN Mendel Ryder reports pt eating at times when she has taken care of him.  Per documentation pt will one meal every 2-3 days.   Gastrointestinal Profile: Last BM: 09/19/2015, abdomen distended, tender with bloating per documentation   Medications: Protonix, 0.45%NS with KCl at 59mL/hr, pepcid  Electrolyte/Renal Profile and Glucose Profile:   Recent Labs Lab 09/14/15 0927  09/18/15 0350 09/19/15 0649 09/20/15 0516  NA 137  < > 141 136 140  K 3.7  < > 3.9 3.4* 3.5  CL 105  < > 104 103 107  CO2 22  < > 27 25 24   BUN 48*  < > 50* 44* 47*  CREATININE 5.38*  < > 4.44* 4.06* 3.77*  CALCIUM 7.7*  < > 7.8* 7.6* 8.0*  PHOS 4.7*  --   --   --   --   GLUCOSE 134*  < > 88 91 96  < > = values in this interval not displayed. Protein Profile:  Recent Labs Lab 09/14/15 0927 09/16/15 0634  ALBUMIN 2.7* 2.2*    Weight Trend since Admission: Filed Weights   09/09/15 1402 09/17/15 0500 09/20/15 0522  Weight:  165 lb 1.6 oz (74.889 kg) 182 lb (82.555 kg) 177 lb (80.287 kg)     Skin:  Reviewed, no issues   BMI:  Body mass index is 24.7 kg/(m^2).  Estimated Nutritional Needs:   Kcal:  BEE: 1536kcals, TEE: (IF 1.1-1.3)(AF 1.2) 0175-1025ENIDP  Protein:  75-82g protein (1.0-1.2g/kg)  Fluid:  1873-2235mL of fluid (25-30mL/kg)  EDUCATION NEEDS:   Education needs addressed last visit    Reagan, RD, LDN Pager [phone]

## 2015-09-20 NOTE — Plan of Care (Signed)
Problem: Discharge Progression Outcomes Goal: Other Discharge Outcomes/Goals Outcome: Progressing Plan of care progress to goals: 1. C/o abdominal pain relieved by PRN Oxycodone   2. Hemodynamically:             -VSS, afebrile               -IVF infusing as ordered               -colostomy beefy red stoma w/ dark loose stool               -Creatinine (4.06) & BUN (44) slowly trending down, adequate urine output throughout the night    3. Tolerating soft diet, appetite increasing  4. High fall risk. Bed alarm on, hourly rounding. Pt understands how to use call system for assistance.

## 2015-09-20 NOTE — Progress Notes (Signed)
No blood return from IJ to draw labs. Second RN w/ unsuccessful attempt. Lab notified pt will need to be stuck for AM labs.

## 2015-09-20 NOTE — Transfer of Care (Signed)
Immediate Anesthesia Transfer of Care Note  Patient: Craig Page  Procedure(s) Performed: Procedure(s) with comments: INSERTION PORT-A-CATH (N/A) - Right   Patient Location: PACU  Anesthesia Type:General  Level of Consciousness: sedated  Airway & Oxygen Therapy: Patient Spontanous Breathing and Patient connected to face mask oxygen  Post-op Assessment: Report given to RN and Post -op Vital signs reviewed and stable  Post vital signs: Reviewed and stable  Last Vitals:  Filed Vitals:   09/20/15 0827  BP: 101/72  Pulse: 101  Temp: 36.2 C  Resp: 28    Complications: No apparent anesthesia complications

## 2015-09-20 NOTE — Progress Notes (Signed)
Craig Page   DOB:Aug 22, 1946   ZO#:[id number]    CC: Metastatic colon cancer status post palliative surgery with colostomy   Subjective:  Denies any fevers or chills.He denies any significant abdominal pain/states his ostomy is working well.  He had a Mediport placed this morning.   ROS:  No diarrhea/no cough or shortness of breath. Continues to feel weak all over.  Objective:  Filed Vitals:   09/20/15 0522  BP: 92/63  Pulse:   Temp:   Resp:      Intake/Output Summary (Last 24 hours) at 09/20/15 0816 Last data filed at 09/20/15 0815  Gross per 24 hour  Intake    660 ml  Output    475 ml  Net    185 ml    GENERAL:alert, no distress and comfortable. he is  accompanied by his daughter. Resting in bed SKIN: skin color, texture, turgor are normal, no rashes or significant lesions EYES: Positive for pallor. OROPHARYNX:no Thrush or ulceration. LYMPH: no palpable lymphadenopathy in the cervical, axillary or inguinal LUNGS: Decreased breath sounds bilaterally.  HEART: regular rate & rhythm and no murmurs and no lower extremity edema ABDOMEN:abdomen soft, mild distention; Ostomy working well. No rigidity or guarding. normal bowel sounds Musculoskeletal:no cyanosis of digits and no clubbing  NEURO: alert & oriented x 3 with fluent speech, no focal motor/sensory deficits   Labs:  Lab Results  Component Value Date   WBC 24.2* 09/20/2015   HGB 9.9* 09/20/2015   HCT 30.3* 09/20/2015   MCV 86.3 09/20/2015   PLT 217 09/20/2015   NEUTROABS 18.4* 09/20/2015    Lab Results  Component Value Date   NA 140 09/20/2015   K 3.5 09/20/2015   CL 107 09/20/2015   CO2 24 09/20/2015    Studies:  US Renal  09/18/2015  CLINICAL DATA:  Acute renal failure EXAM: RENAL / URINARY TRACT ULTRASOUND COMPLETE COMPARISON:  09/09/2015 FINDINGS: Right Kidney: Length: 11.3 cm. Echogenicity within normal limits. No mass or hydronephrosis visualized. Left Kidney: Length: 11.5 cm. Some scarring is  noted in the lower pole of left kidney similar to that seen on prior CT. Bladder: Appears normal for degree of bladder distention. Ascites is noted similar to that seen on recent CT examination. IMPRESSION: No acute renal abnormality noted. Repair of ascites similar to that seen on prior CT Electronically Signed   By: Inez Catalina M.D.   On: 09/18/2015 12:24     Assessment & Plan:   # Primary colonic malignancy with omental/liver metastases;  CEA- 16; pathology- medullary carcinoma of the colon    # Acute renal failure creatinine- continues to improve- at 3.77 today. Nephrology in put appreciated.   # leukocytosis-likely reactive; no fever no chills. and 24,000 stable for the last 2 days/getting worse-would recommend infectious workup/antibiotics.   # Aemia- hemoglobin 9.9.   #  Disposition like to rehabilitation once creatinine is less than 2.   Above plan of care was discussed with the patient/daughter in detail.   Cammie Sickle, MD 09/20/2015  8:16 AM

## 2015-09-20 NOTE — Op Note (Signed)
Preop diagnosis:metastatic colon cancer  Post op diagnosis: same  Operation: Insertion venous port with Ultrasound and Fluoroscopic guidance  Surgeon: S.G.Sankar   Assistant:     Anesthesia: MAC  Complications: none EBL: minimal  Drains: none  Description: Patient was placed the supine position the operating table. A peripheral IV in the left hand was established and the central line in the left side of the neck was removed. With adequate sedation and monitoring the right upper chest and neck area were prepped and draped sterile field. Timeout was performed. Ultrasound probe was sterile cover was brought to the field. With ultrasound guidance a subclavian vein was identified beneath the lateral end of the clavicle and local anesthetic of half percent Marcaine mixed with 1% Xylocaine was instilled. A small 1 cm incision was made beneath the clavicle and the needle was successfully positioned in the subclavian vein with withdrawal of blood easily. Using the Seldinger technique the catheter was positioned going into the distal superior vena cava and the skin mark was at 18 cm. Local anesthetic was instilled over the second costal cartilage and a one-inch incision was made. Subcutaneous pocket was created and the catheter was tunneled through to the site. Catheter was cut to approximate length and then fixed to a prefilled port. Port was placed in the pocket and anchored with 3 stitches of 2- 0 Prolene. Port was then flushed through with heparinized saline. Incision closed with the 3-0 Vicryl the subcutaneous tissue and the skin with subcuticular 4-0 Vicryl. Liquid ban was applied. No immediate problems from the procedure. Patient returned to PACU in stable condition.

## 2015-09-20 NOTE — Anesthesia Procedure Notes (Signed)
Procedure Name: MAC Date/Time: 09/20/2015 7:28 AM Performed by: Doreen Salvage Pre-anesthesia Checklist: Patient identified, Emergency Drugs available, Suction available and Patient being monitored Patient Re-evaluated:Patient Re-evaluated prior to inductionOxygen Delivery Method: Simple face mask

## 2015-09-20 NOTE — Progress Notes (Signed)
Physical Therapy Treatment Patient Details Name: Craig Page MRN: [id number] DOB: 03-Sep-1946 Today's Date: 09/20/2015    History of Present Illness Pt is a 69 yo male admitted to the hospital s/p colostomy and biopsy     PT Comments    Pt fatigued and weak this morning, possibly from procedure this morning. Pt was having trouble with standing upright (non-correctable left lateral lean) and unable to progress RLE with attempted gait. Pt stated that the right leg felt heavier than the left leg. Pt was screened for any neurological symptoms: sensation intact, strength symmetrical in both UE and LE, no facial drooping, no speech issues, A & O, and no headache or distress noted. Nurse notified of session. Due to his strength, endurance, and mobility deficits he will continue to benefit from skilled PT for return to baseline level of function.   Follow Up Recommendations  SNF     Equipment Recommendations  Rolling walker with 5" wheels    Recommendations for Other Services       Precautions / Restrictions Restrictions Weight Bearing Restrictions: No Other Position/Activity Restrictions: Ostomy bag     Mobility  Bed Mobility Overal bed mobility: Needs Assistance Bed Mobility: Supine to Sit     Supine to sit: Min assist     General bed mobility comments: Pt needs assist for trunk. Good LE strength getting legs to EOB  Transfers Overall transfer level: Needs assistance Equipment used: Rolling walker (2 wheeled) Transfers: Sit to/from Stand Sit to Stand: Min assist         General transfer comment: Minor assist for getting into standing. Still needs cues for hand placement. Once in standing, pt had moderate left-lateral lean that he could not correct without help of therapist. He was sat back down and then we re-attempted, which he demonstrated better balance, but when we went to ambulate pt was unable to advance RLE. Pt sat back down.  Ambulation/Gait Ambulation/Gait  assistance:  (Not safe)               Stairs            Wheelchair Mobility    Modified Rankin (Stroke Patients Only)       Balance Overall balance assessment: Needs assistance         Standing balance support: Bilateral upper extremity supported Standing balance-Leahy Scale: Fair Standing balance comment: Left lateral lean                    Cognition Arousal/Alertness: Awake/alert Behavior During Therapy: WFL for tasks assessed/performed Overall Cognitive Status: Within Functional Limits for tasks assessed                      Exercises Other Exercises Other Exercises: Pt performed bilateral therex x 15 reps at supervision for proper technique. Exercises included: ankle pumps, SLR, hip abd, LAQ, and glute sets    General Comments        Pertinent Vitals/Pain Pain Assessment: 0-10 Pain Score: 6  Pain Location: abdomen Pain Intervention(s): Limited activity within patient's tolerance;Monitored during session;Premedicated before session    Home Living                      Prior Function            PT Goals (current goals can now be found in the care plan section) Acute Rehab PT Goals Patient Stated Goal: to try to walk PT Goal Formulation: With  patient Time For Goal Achievement: 09/27/15 Potential to Achieve Goals: Good Progress towards PT goals: Progressing toward goals    Frequency  Min 2X/week    PT Plan Current plan remains appropriate    Co-evaluation             End of Session Equipment Utilized During Treatment: Gait belt Activity Tolerance: Patient tolerated treatment well Patient left: with call bell/phone within reach;in bed;with bed alarm set     Time: 1125-1140 PT Time Calculation (min) (ACUTE ONLY): 15 min  Charges:                       G CodesJanyth Contes 10-09-15, 12:42 PM  Janyth Contes, SPT. [phone]

## 2015-09-20 NOTE — Care Management Important Message (Signed)
Important Message  Patient Details  Name: Craig Page MRN: [id number] Date of Birth: 01-May-1946   Medicare Important Message Given:  Yes-second notification given    [name] 09/20/2015, 2:24 PM

## 2015-09-21 ENCOUNTER — Inpatient Hospital Stay: Payer: Commercial Managed Care - HMO

## 2015-09-21 LAB — BASIC METABOLIC PANEL
ANION GAP: 8 (ref 5–15)
BUN: 43 mg/dL — AB (ref 6–20)
CALCIUM: 8 mg/dL — AB (ref 8.9–10.3)
CHLORIDE: 109 mmol/L (ref 101–111)
CO2: 24 mmol/L (ref 22–32)
CREATININE: 3.5 mg/dL — AB (ref 0.61–1.24)
GFR, EST AFRICAN AMERICAN: 19 mL/min — AB (ref >60)
GFR, EST NON AFRICAN AMERICAN: 17 mL/min — AB (ref >60)
Glucose, Bld: 90 mg/dL (ref 65–99)
Potassium: 3.5 mmol/L (ref 3.5–5.1)
SODIUM: 141 mmol/L (ref 135–145)

## 2015-09-21 LAB — CBC WITH DIFFERENTIAL/PLATELET
BASOS PCT: 1 %
Basophils Absolute: 0.1 10*3/uL (ref 0–0.1)
EOS ABS: 1.3 10*3/uL — AB (ref 0–0.7)
Eosinophils Relative: 6 %
HEMATOCRIT: 29.5 % — AB (ref 40.0–52.0)
HEMOGLOBIN: 9.7 g/dL — AB (ref 13.0–18.0)
LYMPHS ABS: 2.2 10*3/uL (ref 1.0–3.6)
LYMPHS PCT: 10 %
MCH: 28.8 pg (ref 26.0–34.0)
MCHC: 32.9 g/dL (ref 32.0–36.0)
MCV: 87.4 fL (ref 80.0–100.0)
MONOS PCT: 7 %
Monocytes Absolute: 1.5 10*3/uL — ABNORMAL HIGH (ref 0.2–1.0)
Neutro Abs: 17.3 10*3/uL — ABNORMAL HIGH (ref 1.4–6.5)
Neutrophils Relative %: 76 %
Platelets: 195 10*3/uL (ref 150–440)
RBC: 3.37 MIL/uL — AB (ref 4.40–5.90)
RDW: 27.6 % — ABNORMAL HIGH (ref 11.5–14.5)
WBC: 22.4 10*3/uL — ABNORMAL HIGH (ref 3.8–10.6)

## 2015-09-21 NOTE — Progress Notes (Signed)
[facility] Hematology/Oncology Progress Note  Date of admission: 09/09/2015  Hospital day:  09/21/2015  Chief Complaint: Craig Page is a 69 y.o. male who was admitted with acute renal failure associated with perforated colon secondary to underlying medullary colon cancer.  Subjective:  Feels pretty good.  Denies any abdominal pain.  Ostomy working well.  Portt-a-cath site slightly tender.  Social History: The patient is alone today.  Allergies: No Known Allergies  Scheduled Medications: . amiodarone  200 mg Oral Daily  . famotidine  20 mg Oral Daily  . feeding supplement (ENSURE ENLIVE)  237 mL Oral Q24H  . pantoprazole  40 mg Oral Daily  . sodium chloride  10-40 mL Intracatheter Q12H    Review of Systems: GENERAL:  Doing "ok".  No fevers, sweats or weight loss. PERFORMANCE STATUS (ECOG):  2 HEENT:  No visual changes, runny nose, sore throat, mouth sores or tenderness. Lungs: No shortness of breath or cough.  No hemoptysis. Cardiac:  No chest pain, palpitations, orthopnea, or PND. GI:  No nausea, vomiting, diarrhea, constipation, melena or hematochezia. GU:  No urgency, frequency, dysuria, or hematuria. Musculoskeletal:  No back pain.  No joint pain.  No muscle tenderness. Extremities:  No pain or swelling. Skin:  No rashes or skin changes. Neuro:  No headache, numbness or weakness, balance or coordination issues. Endocrine:  No diabetes, thyroid issues, hot flashes or night sweats. Psych:  No mood changes, depression or anxiety. Pain:  No focal pain. Review of systems:  All other systems reviewed and found to be negative.  Physical Exam: Blood pressure 111/81, pulse 88, temperature 98.4 F (36.9 C), temperature source Oral, resp. rate 20, height 5' 11"  (1.803 m), weight 177 lb (80.287 kg), SpO2 95 %.  GENERAL:  Well developed, well nourished, sitting comfortably on the medical unit in no acute distress. MENTAL STATUS:  Alert and oriented to person,  place and time. HEAD:  Pearline Cables hair.  Mustache.  Normocephalic, atraumatic, face symmetric, no Cushingoid features. EYES:  Pupils equal round and reactive to light and accomodation.  No conjunctivitis or scleral icterus. ENT:  Oropharynx clear without lesion.  Tongue normal. Mucous membranes moist.  RESPIRATORY:  Clear to auscultation without rales, wheezes or rhonchi. CARDIOVASCULAR:  Regular rate and rhythm without murmur, rub or gallop. ABDOMEN:  Abdominal distension.  Well healing laparoscopic scars.  Right sided ostomy with dark liquid stool.  Soft, minimally tender in the left upper quadrant without guarding or rebound tenderness. Active bowel sounds. No hepatosplenomegaly. Left upper quadrant mass 6 cm below the costal margin and 3 cm above the umbilicus.  SKIN:  Scattered upper extremity ecchymosis.  No rashes, ulcers or lesions. EXTREMITIES: No edema, no skin discoloration or tenderness.  No palpable cords. LYMPH NODES: No palpable cervical, supraclavicular, axillary or inguinal adenopathy  NEUROLOGICAL: Unremarkable. PSYCH:  Appropriate.  Results for orders placed or performed during the hospital encounter of 09/09/15 (from the past 48 hour(s))  CBC with Differential     Status: Abnormal   Collection Time: 09/20/15  5:16 AM  Result Value Ref Range   WBC 24.2 (H) 3.8 - 10.6 K/uL   RBC 3.51 (L) 4.40 - 5.90 MIL/uL   Hemoglobin 9.9 (L) 13.0 - 18.0 g/dL   HCT 30.3 (L) 40.0 - 52.0 %   MCV 86.3 80.0 - 100.0 fL   MCH 28.3 26.0 - 34.0 pg   MCHC 32.8 32.0 - 36.0 g/dL   RDW 27.3 (H) 11.5 - 14.5 %  Platelets 217 150 - 440 K/uL   Neutrophils Relative % 76 %   Neutro Abs 18.4 (H) 1.4 - 6.5 K/uL   Lymphocytes Relative 10 %   Lymphs Abs 2.5 1.0 - 3.6 K/uL   Monocytes Relative 7 %   Monocytes Absolute 1.8 (H) 0.2 - 1.0 K/uL   Eosinophils Relative 6 %   Eosinophils Absolute 1.4 (H) 0 - 0.7 K/uL   Basophils Relative 1 %   Basophils Absolute 0.1 0 - 0.1 K/uL  Basic metabolic panel      Status: Abnormal   Collection Time: 09/20/15  5:16 AM  Result Value Ref Range   Sodium 140 135 - 145 mmol/L   Potassium 3.5 3.5 - 5.1 mmol/L   Chloride 107 101 - 111 mmol/L   CO2 24 22 - 32 mmol/L   Glucose, Bld 96 65 - 99 mg/dL   BUN 47 (H) 6 - 20 mg/dL   Creatinine, Ser 3.77 (H) 0.61 - 1.24 mg/dL   Calcium 8.0 (L) 8.9 - 10.3 mg/dL   GFR calc non Af Amer 15 (L) >60 mL/min   GFR calc Af Amer 18 (L) >60 mL/min    Comment: (NOTE) The eGFR has been calculated using the CKD EPI equation. This calculation has not been validated in all clinical situations. eGFR's persistently <60 mL/min signify possible Chronic Kidney Disease.    Anion gap 9 5 - 15  CBC with Differential     Status: Abnormal   Collection Time: 09/21/15  6:03 AM  Result Value Ref Range   WBC 22.4 (H) 3.8 - 10.6 K/uL   RBC 3.37 (L) 4.40 - 5.90 MIL/uL   Hemoglobin 9.7 (L) 13.0 - 18.0 g/dL   HCT 29.5 (L) 40.0 - 52.0 %   MCV 87.4 80.0 - 100.0 fL   MCH 28.8 26.0 - 34.0 pg   MCHC 32.9 32.0 - 36.0 g/dL   RDW 27.6 (H) 11.5 - 14.5 %   Platelets 195 150 - 440 K/uL   Neutrophils Relative % 76 %   Neutro Abs 17.3 (H) 1.4 - 6.5 K/uL   Lymphocytes Relative 10 %   Lymphs Abs 2.2 1.0 - 3.6 K/uL   Monocytes Relative 7 %   Monocytes Absolute 1.5 (H) 0.2 - 1.0 K/uL   Eosinophils Relative 6 %   Eosinophils Absolute 1.3 (H) 0 - 0.7 K/uL   Basophils Relative 1 %   Basophils Absolute 0.1 0 - 0.1 K/uL  Basic metabolic panel     Status: Abnormal   Collection Time: 09/21/15  6:03 AM  Result Value Ref Range   Sodium 141 135 - 145 mmol/L   Potassium 3.5 3.5 - 5.1 mmol/L   Chloride 109 101 - 111 mmol/L   CO2 24 22 - 32 mmol/L   Glucose, Bld 90 65 - 99 mg/dL   BUN 43 (H) 6 - 20 mg/dL   Creatinine, Ser 3.50 (H) 0.61 - 1.24 mg/dL   Calcium 8.0 (L) 8.9 - 10.3 mg/dL   GFR calc non Af Amer 17 (L) >60 mL/min   GFR calc Af Amer 19 (L) >60 mL/min    Comment: (NOTE) The eGFR has been calculated using the CKD EPI equation. This calculation  has not been validated in all clinical situations. eGFR's persistently <60 mL/min signify possible Chronic Kidney Disease.    Anion gap 8 5 - 15   Dg Chest 1 View  09/20/2015  CLINICAL DATA:  Postop port placement EXAM: CHEST 1 VIEW COMPARISON:  09/14/2015  FINDINGS: Prior CABG. Right Port-A-Cath is in place with the tip at the cavoatrial junction. No pneumothorax Interval removal of left central line. There is cardiomegaly with vascular congestion. Left lower lobe atelectasis or infiltrate. Right lung is clear. No visible effusions. IMPRESSION: Right port placement with the tip at the cavoatrial junction. No pneumothorax. Cardiomegaly, vascular congestion. Left lower lobe atelectasis or infiltrate. Electronically Signed   By: Rolm Baptise M.D.   On: 09/20/2015 09:01   Dg C-arm 1-60 Min-no Report  09/20/2015  CLINICAL DATA: Port placement C-ARM 1-60 MINUTES Fluoroscopy was utilized by the requesting physician.  No radiographic interpretation.    Assessment:  Craig Page is a 69 y.o. male with metastatic medullary carcinoma of the colon presenting with a contained perforated colon at the splenic flexure.  He is status post diverting colostomy on 09/10/2015.  Port-a-cath was placed on 09/20/2015 for anticipated outpatient chemotherapy.  He has acute renal insufficiency felt secondary to ATN.  Creatinine is slowly improving with hydration.    Symptomatically, he denies any complaint.  Plan: 1. Hematology/Oncology:  Patient with known peritoneal metastasis as well as imaging findings worrisome for liver metastasis.  CEA is 16.  Anticipate outpatient FOLFOX chemotherapy.  Hematocrit stable.  WBC 22k.  Suspect due to perforated colon.  No fever. 2. Renal:  Acute renal insufficiency slowly resolving.  Per notes, discharge when Cr < 2.0.  Appreciate nephrology assistance. 3. Disposition:  Continued inpatient stay secondary to renal insufficiency.   Lequita Asal, MD  09/21/2015, 10:11 AM

## 2015-09-21 NOTE — Progress Notes (Signed)
Problem: Discharge Progression Outcomes Goal: Other Discharge Outcomes/Goals Outcome: Progressing Plan of care progress to goals:   Complained of Back pain, PRN Oxycodone given with relief  Hemodynamically:  -VSS, afebrile   -IVF infusing as ordered   -colostomy beefy red stoma w/ dark loose stool   -Creatinine down to 3.5   . Tolerating diet, appetite increasing   . High fall risk. Bed alarm on, hourly rounding. Pt understands how to use call system for assistance.

## 2015-09-21 NOTE — Progress Notes (Signed)
[facility]  ROUNDING NOTE   Subjective:  Pt seen at bedside.  Cr down to 3.50.  Remains on IVFs.     Objective:  Vital signs in last 24 hours:  Temp:  [98.2 F (36.8 C)-98.4 F (36.9 C)] 98.4 F (36.9 C) (10/15 0507) Pulse Rate:  [86-91] 88 (10/15 0900) Resp:  [20] 20 (10/15 0507) BP: (111-112)/(69-81) 111/81 mmHg (10/15 0507) SpO2:  [93 %-95 %] 95 % (10/15 0900)  Weight change:  Filed Weights   09/09/15 1402 09/17/15 0500 09/20/15 0522  Weight: 74.889 kg (165 lb 1.6 oz) 82.555 kg (182 lb) 80.287 kg (177 lb)    Intake/Output: I/O last 3 completed shifts: In: 640 [P.O.:290; I.V.:350] Out: 750 [Urine:750]   Intake/Output this shift:  Total I/O In: -  Out: 425 [Urine:425]  Physical Exam: General: NAD  Head: Normocephalic, atraumatic. moist mucosal membranes  Eyes: Anicteric  Neck: Supple, trachea midline  Lungs:  Clear to auscultation normal effort  Heart: Regular rate and rhythm  Abdomen:  Soft, nontender, +right upper quadrant ostomy   Extremities: no peripheral edema.  Neurologic: Nonfocal, moving all four extremities  Skin: No lesions       Basic Metabolic Panel:  Recent Labs Lab 09/17/15 0448 09/18/15 0350 09/19/15 0649 09/20/15 0516 09/21/15 0603  NA 138 141 136 140 141  K 3.9 3.9 3.4* 3.5 3.5  CL 98* 104 103 107 109  CO2 27 27 25 24 24   GLUCOSE 90 88 91 96 90  BUN 48* 50* 44* 47* 43*  CREATININE 4.54* 4.44* 4.06* 3.77* 3.50*  CALCIUM 7.9* 7.8* 7.6* 8.0* 8.0*    Liver Function Tests:  Recent Labs Lab 09/16/15 0634  AST 13*  ALT 9*  ALKPHOS 204*  BILITOT 1.3*  PROT 4.2*  ALBUMIN 2.2*   No results for input(s): LIPASE, AMYLASE in the last 168 hours. No results for input(s): AMMONIA in the last 168 hours.  CBC:  Recent Labs Lab 09/16/15 0634 09/17/15 0448 09/19/15 0649 09/20/15 0516 09/21/15 0603  WBC 21.5* 20.0* 24.1* 24.2* 22.4*  NEUTROABS 19.6* 17.4* 18.9* 18.4* 17.3*  HGB 9.1* 8.1* 8.9* 9.9* 9.7*  HCT  27.3* 25.3* 27.4* 30.3* 29.5*  MCV 82.0 84.8 85.4 86.3 87.4  PLT 168 156 214 217 195    Cardiac Enzymes: No results for input(s): CKTOTAL, CKMB, CKMBINDEX, TROPONINI in the last 168 hours.  BNP: Invalid input(s): POCBNP  CBG: No results for input(s): GLUCAP in the last 168 hours.  Microbiology: Results for orders placed or performed during the hospital encounter of 09/09/15  Culture, blood (routine x 2)     Status: None   Collection Time: 09/09/15  2:59 PM  Result Value Ref Range Status   Specimen Description BLOOD RIGHT ASSIST CONTROL  Final   Special Requests AER 10ML ANA 10ML  Final   Culture NO GROWTH 5 DAYS  Final   Report Status 09/14/2015 FINAL  Final  Culture, blood (routine x 2)     Status: None   Collection Time: 09/09/15  3:38 PM  Result Value Ref Range Status   Specimen Description BLOOD RIGHT ARM  Final   Special Requests AER 10ML ANA 10ML  Final   Culture NO GROWTH 5 DAYS  Final   Report Status 09/14/2015 FINAL  Final  Urine culture     Status: None   Collection Time: 09/09/15  6:34 PM  Result Value Ref Range Status   Specimen Description URINE, CLEAN CATCH  Final   Special Requests  NONE  Final   Culture NO GROWTH 1 DAY  Final   Report Status 09/11/2015 FINAL  Final    Coagulation Studies: No results for input(s): LABPROT, INR in the last 72 hours.  Urinalysis: No results for input(s): COLORURINE, LABSPEC, PHURINE, GLUCOSEU, HGBUR, BILIRUBINUR, KETONESUR, PROTEINUR, UROBILINOGEN, NITRITE, LEUKOCYTESUR in the last 72 hours.  Invalid input(s): APPERANCEUR    Imaging: Dg Chest 1 View  09/20/2015  CLINICAL DATA:  Postop port placement EXAM: CHEST 1 VIEW COMPARISON:  09/14/2015 FINDINGS: Prior CABG. Right Port-A-Cath is in place with the tip at the cavoatrial junction. No pneumothorax Interval removal of left central line. There is cardiomegaly with vascular congestion. Left lower lobe atelectasis or infiltrate. Right lung is clear. No visible effusions.  IMPRESSION: Right port placement with the tip at the cavoatrial junction. No pneumothorax. Cardiomegaly, vascular congestion. Left lower lobe atelectasis or infiltrate. Electronically Signed   By: Rolm Baptise M.D.   On: 09/20/2015 09:01   Dg Abd 2 Views  09/21/2015  CLINICAL DATA:  Abdominal distension. EXAM: ABDOMEN - 2 VIEW COMPARISON:  CT of 09/09/2015 FINDINGS: Prior median sternotomy. Upright view demonstrates multiple small bowel air-fluid levels. Probable left pleural effusion. Right lower quadrant colostomy. supine images demonstrate mild small bowel distention, at up to 3.6 cm. No gaseous distention of the colon. Distal contrast within the rectum and sigmoid. Aortic and branch vessel atherosclerosis. IMPRESSION: Mild small bowel distention with air-fluid levels. This could represent low-grade partial small bowel obstruction or postoperative adynamic ileus. No free intraperitoneal air or other acute complication. Probable small left pleural effusion, suboptimally evaluated. Electronically Signed   By: Abigail Miyamoto M.D.   On: 09/21/2015 13:42   Dg C-arm 1-60 Min-no Report  09/20/2015  CLINICAL DATA: Port placement C-ARM 1-60 MINUTES Fluoroscopy was utilized by the requesting physician.  No radiographic interpretation.     Medications:   . 0.45 % NaCl with KCl 20 mEq / L 50 mL/hr at 09/17/15 1729   . amiodarone  200 mg Oral Daily  . famotidine  20 mg Oral Daily  . feeding supplement (ENSURE ENLIVE)  237 mL Oral Q24H  . pantoprazole  40 mg Oral Daily  . sodium chloride  10-40 mL Intracatheter Q12H   acetaminophen **OR** acetaminophen, fentaNYL (SUBLIMAZE) injection, morphine injection, ondansetron (ZOFRAN) IVPB [facility] +/- dexamethasone, ondansetron (ZOFRAN) IV, ondansetron, oxyCODONE, simethicone, sodium chloride  Assessment/ Plan:  Craig Page is a 69 y.o.white male with coronary artery disease, congestive heart failure, hypertension, hyperlipidemia, follicular lymphoma who was  admitted to [facility] on 09/09/2015 for acute renal failure. Colonic obstruction   1. Acute Renal Failure: most likely ATN. Cr trending down slowly, original UA showed no proteinuria. - Renal function improving albeit very slowly, Cr down to 3.50.  Continue IVF hydration and follow renal function trend daily.  No indication for HD.    2. Hypertension: normotensive off antihypertensives.   3. Anemia: with persistent leukocytosis. hgb 9.7 has and has been relatively stable.     4. Chronic Systolic Congestive Heart Failure: appears well compensated, has done well on IVFs.     LOS: 12 Salsabeel Gorelick 10/15/20162:00 PM

## 2015-09-21 NOTE — Progress Notes (Signed)
SUBJECTIVE: doing ok   Filed Vitals:   09/20/15 1350 09/20/15 2056 09/21/15 0507 09/21/15 0900  BP: 100/60 112/69 111/81   Pulse: 76 91 86 88  Temp: 98 F (36.7 C) 98.2 F (36.8 C) 98.4 F (36.9 C)   TempSrc: Oral Oral Oral   Resp:  20 20   Height:      Weight:      SpO2: 95% 95% 93% 95%    Intake/Output Summary (Last 24 hours) at 09/21/15 1019 Last data filed at 09/21/15 0900  Gross per 24 hour  Intake    240 ml  Output    725 ml  Net   -485 ml    LABS: Basic Metabolic Panel:  Recent Labs  09/20/15 0516 09/21/15 0603  NA 140 141  K 3.5 3.5  CL 107 109  CO2 24 24  GLUCOSE 96 90  BUN 47* 43*  CREATININE 3.77* 3.50*  CALCIUM 8.0* 8.0*   Liver Function Tests: No results for input(s): AST, ALT, ALKPHOS, BILITOT, PROT, ALBUMIN in the last 72 hours. No results for input(s): LIPASE, AMYLASE in the last 72 hours. CBC:  Recent Labs  09/20/15 0516 09/21/15 0603  WBC 24.2* 22.4*  NEUTROABS 18.4* 17.3*  HGB 9.9* 9.7*  HCT 30.3* 29.5*  MCV 86.3 87.4  PLT 217 195   Cardiac Enzymes: No results for input(s): CKTOTAL, CKMB, CKMBINDEX, TROPONINI in the last 72 hours. BNP: Invalid input(s): POCBNP D-Dimer: No results for input(s): DDIMER in the last 72 hours. Hemoglobin A1C: No results for input(s): HGBA1C in the last 72 hours. Fasting Lipid Panel: No results for input(s): CHOL, HDL, LDLCALC, TRIG, CHOLHDL, LDLDIRECT in the last 72 hours. Thyroid Function Tests: No results for input(s): TSH, T4TOTAL, T3FREE, THYROIDAB in the last 72 hours.  Invalid input(s): FREET3 Anemia Panel: No results for input(s): VITAMINB12, FOLATE, FERRITIN, TIBC, IRON, RETICCTPCT in the last 72 hours.   PHYSICAL EXAM General: Well developed, well nourished, in no acute distress HEENT:  Normocephalic and atramatic Neck:  No JVD.  Lungs: Clear bilaterally to auscultation and percussion. Heart: HRRR . Normal S1 and S2 without gallops or murmurs.  Abdomen: Bowel sounds are positive,  abdomen soft and non-tender  Msk:  Back normal, normal gait. Normal strength and tone for age. Extremities: No clubbing, cyanosis or edema.   Neuro: Alert and oriented X 3. Psych:  Good affect, responds appropriately  TELEMETRY: ASSESSMENT AND PLAN: Doing well, eating  Active Problems:   Colonic obstruction (HCC)   Peritoneal carcinomatosis ([place])   Colonic mass   Abdominal distension    Ileene Allie A, MD, [facility] 09/21/2015 10:19 AM

## 2015-09-21 NOTE — Progress Notes (Signed)
Patient ID: Craig Page, male   DOB: [birth year], 69 y.o.   MRN: [id number] No complaints. Port incision looks clean. Abdomen is a bit more distended today. Denies any pain, no n/v. Colostomy is functioning well. Will get xray abd 2 views.

## 2015-09-21 NOTE — Plan of Care (Signed)
Problem: Discharge Progression Outcomes Goal: Other Discharge Outcomes/Goals Outcome: Progressing Plan of care progress to goals:     Hemodynamically:             -VSS, afebrile                -IVF infusing as ordered                -colostomy beefy red stoma w/ dark loose stool                   . Tolerating  diet, appetite increasing   . High fall risk. Bed alarm on, hourly rounding. Pt understands how to use call system for assistance.

## 2015-09-22 LAB — BASIC METABOLIC PANEL
Anion gap: 6 (ref 5–15)
BUN: 42 mg/dL — AB (ref 6–20)
CALCIUM: 7.7 mg/dL — AB (ref 8.9–10.3)
CHLORIDE: 110 mmol/L (ref 101–111)
CO2: 24 mmol/L (ref 22–32)
CREATININE: 2.99 mg/dL — AB (ref 0.61–1.24)
GFR calc non Af Amer: 20 mL/min — ABNORMAL LOW (ref >60)
GFR, EST AFRICAN AMERICAN: 23 mL/min — AB (ref >60)
Glucose, Bld: 86 mg/dL (ref 65–99)
Potassium: 3.6 mmol/L (ref 3.5–5.1)
SODIUM: 140 mmol/L (ref 135–145)

## 2015-09-22 LAB — CBC WITH DIFFERENTIAL/PLATELET
BASOS PCT: 1 %
Basophils Absolute: 0.1 10*3/uL (ref 0–0.1)
EOS ABS: 1.1 10*3/uL — AB (ref 0–0.7)
Eosinophils Relative: 6 %
HCT: 28.6 % — ABNORMAL LOW (ref 40.0–52.0)
HEMOGLOBIN: 9.5 g/dL — AB (ref 13.0–18.0)
LYMPHS ABS: 1.7 10*3/uL (ref 1.0–3.6)
Lymphocytes Relative: 9 %
MCH: 29 pg (ref 26.0–34.0)
MCHC: 33.3 g/dL (ref 32.0–36.0)
MCV: 86.8 fL (ref 80.0–100.0)
MONO ABS: 1.5 10*3/uL — AB (ref 0.2–1.0)
MONOS PCT: 8 %
NEUTROS ABS: 14.6 10*3/uL — AB (ref 1.4–6.5)
NEUTROS PCT: 76 %
Platelets: 164 10*3/uL (ref 150–440)
RBC: 3.3 MIL/uL — ABNORMAL LOW (ref 4.40–5.90)
RDW: 27.5 % — AB (ref 11.5–14.5)
WBC: 19.1 10*3/uL — AB (ref 3.8–10.6)

## 2015-09-22 MED ORDER — PENTAFLUOROPROP-TETRAFLUOROETH EX AERO
INHALATION_SPRAY | CUTANEOUS | Status: DC | PRN
  Administered 2015-09-22: 23:00:00 via TOPICAL
  Filled 2015-09-22: qty 103.5

## 2015-09-22 NOTE — Plan of Care (Signed)
Problem: Discharge Progression Outcomes Goal: Other Discharge Outcomes/Goals Outcome: Progressing Pt is disoriented to time, no c/o pain. Small output from colostomy. Incisions are clean, dry and intact. VSS, continues on 4 L [place], encouraged to call for assistance.

## 2015-09-22 NOTE — Progress Notes (Signed)
[facility]  ROUNDING NOTE   Subjective:  Cr down to 2.99 today. Good UOP noted.  Cr has been trending down slowly.    Objective:  Vital signs in last 24 hours:  Temp:  [97.5 F (36.4 C)-98.7 F (37.1 C)] 98.7 F (37.1 C) (10/16 0933) Pulse Rate:  [78-86] 86 (10/16 0933) Resp:  [18-20] 18 (10/16 0933) BP: (106-130)/(59-79) 130/71 mmHg (10/16 0933) SpO2:  [94 %-98 %] 94 % (10/16 0933)  Weight change:  Filed Weights   09/09/15 1402 09/17/15 0500 09/20/15 0522  Weight: 74.889 kg (165 lb 1.6 oz) 82.555 kg (182 lb) 80.287 kg (177 lb)    Intake/Output: I/O last 3 completed shifts: In: 240 [P.O.:240] Out: 2125 [Urine:1875; Stool:250]   Intake/Output this shift:  Total I/O In: -  Out: 320 [Urine:320]  Physical Exam: General: NAD  Head: Normocephalic, atraumatic. moist mucosal membranes  Eyes: Anicteric  Neck: Supple, trachea midline  Lungs:  Clear to auscultation normal effort  Heart: Regular rate and rhythm  Abdomen:  Soft, nontender, +right upper quadrant ostomy, distended   Extremities: no peripheral edema.  Neurologic: Nonfocal, moving all four extremities  Skin: No lesions       Basic Metabolic Panel:  Recent Labs Lab 09/18/15 0350 09/19/15 0649 09/20/15 0516 09/21/15 0603 09/22/15 0526  NA 141 136 140 141 140  K 3.9 3.4* 3.5 3.5 3.6  CL 104 103 107 109 110  CO2 27 25 24 24 24   GLUCOSE 88 91 96 90 86  BUN 50* 44* 47* 43* 42*  CREATININE 4.44* 4.06* 3.77* 3.50* 2.99*  CALCIUM 7.8* 7.6* 8.0* 8.0* 7.7*    Liver Function Tests:  Recent Labs Lab 09/16/15 0634  AST 13*  ALT 9*  ALKPHOS 204*  BILITOT 1.3*  PROT 4.2*  ALBUMIN 2.2*   No results for input(s): LIPASE, AMYLASE in the last 168 hours. No results for input(s): AMMONIA in the last 168 hours.  CBC:  Recent Labs Lab 09/17/15 0448 09/19/15 0649 09/20/15 0516 09/21/15 0603 09/22/15 0526  WBC 20.0* 24.1* 24.2* 22.4* 19.1*  NEUTROABS 17.4* 18.9* 18.4* 17.3* 14.6*   HGB 8.1* 8.9* 9.9* 9.7* 9.5*  HCT 25.3* 27.4* 30.3* 29.5* 28.6*  MCV 84.8 85.4 86.3 87.4 86.8  PLT 156 214 217 195 164    Cardiac Enzymes: No results for input(s): CKTOTAL, CKMB, CKMBINDEX, TROPONINI in the last 168 hours.  BNP: Invalid input(s): POCBNP  CBG: No results for input(s): GLUCAP in the last 168 hours.  Microbiology: Results for orders placed or performed during the hospital encounter of 09/09/15  Culture, blood (routine x 2)     Status: None   Collection Time: 09/09/15  2:59 PM  Result Value Ref Range Status   Specimen Description BLOOD RIGHT ASSIST CONTROL  Final   Special Requests AER 10ML ANA 10ML  Final   Culture NO GROWTH 5 DAYS  Final   Report Status 09/14/2015 FINAL  Final  Culture, blood (routine x 2)     Status: None   Collection Time: 09/09/15  3:38 PM  Result Value Ref Range Status   Specimen Description BLOOD RIGHT ARM  Final   Special Requests AER 10ML ANA 10ML  Final   Culture NO GROWTH 5 DAYS  Final   Report Status 09/14/2015 FINAL  Final  Urine culture     Status: None   Collection Time: 09/09/15  6:34 PM  Result Value Ref Range Status   Specimen Description URINE, CLEAN CATCH  Final  Special Requests NONE  Final   Culture NO GROWTH 1 DAY  Final   Report Status 09/11/2015 FINAL  Final    Coagulation Studies: No results for input(s): LABPROT, INR in the last 72 hours.  Urinalysis: No results for input(s): COLORURINE, LABSPEC, PHURINE, GLUCOSEU, HGBUR, BILIRUBINUR, KETONESUR, PROTEINUR, UROBILINOGEN, NITRITE, LEUKOCYTESUR in the last 72 hours.  Invalid input(s): APPERANCEUR    Imaging: Dg Abd 2 Views  09/21/2015  CLINICAL DATA:  Abdominal distension. EXAM: ABDOMEN - 2 VIEW COMPARISON:  CT of 09/09/2015 FINDINGS: Prior median sternotomy. Upright view demonstrates multiple small bowel air-fluid levels. Probable left pleural effusion. Right lower quadrant colostomy. supine images demonstrate mild small bowel distention, at up to 3.6 cm. No  gaseous distention of the colon. Distal contrast within the rectum and sigmoid. Aortic and branch vessel atherosclerosis. IMPRESSION: Mild small bowel distention with air-fluid levels. This could represent low-grade partial small bowel obstruction or postoperative adynamic ileus. No free intraperitoneal air or other acute complication. Probable small left pleural effusion, suboptimally evaluated. Electronically Signed   By: Abigail Miyamoto M.D.   On: 09/21/2015 13:42     Medications:   . 0.45 % NaCl with KCl 20 mEq / L 50 mL/hr at 09/22/15 1146   . amiodarone  200 mg Oral Daily  . famotidine  20 mg Oral Daily  . feeding supplement (ENSURE ENLIVE)  237 mL Oral Q24H  . pantoprazole  40 mg Oral Daily  . sodium chloride  10-40 mL Intracatheter Q12H   acetaminophen **OR** acetaminophen, fentaNYL (SUBLIMAZE) injection, morphine injection, ondansetron (ZOFRAN) IVPB [facility] +/- dexamethasone, ondansetron (ZOFRAN) IV, ondansetron, oxyCODONE, simethicone, sodium chloride  Assessment/ Plan:  [name] is a 69 y.o.white male with coronary artery disease, congestive heart failure, hypertension, hyperlipidemia, follicular lymphoma who was admitted to [facility] on 09/09/2015 for acute renal failure. Colonic obstruction   1. Acute Renal Failure: most likely ATN. Cr trending down slowly, original UA showed no proteinuria. - Cr continues to trend down, currently Cr down to 2.9, good UOP noted, would continue on hydration and follow UOP and Cr trend daily.    2. Hypertension: normotensive off antihypertensives.   3. Anemia D64.9: with persistent leukocytosis. hgb 9.5 and relatively stable.       4. Chronic Systolic Congestive Heart Failure: Has been on IVFs several days without any sign of heart failure, would continue to monitor.    LOS: Craig Page, Craig Page 10/16/20161:19 PM

## 2015-09-22 NOTE — Progress Notes (Signed)
[facility] Hematology/Oncology Progress Note  Date of admission: 09/09/2015  Hospital day:  09/22/2015  Chief Complaint: Craig Page is a 69 y.o. male who was admitted with acute renal failure associated with perforated colon secondary to underlying medullary colon cancer.  Subjective:  Feels good.  Denies any abdominal pain.  Ostomy working well.  Notes chronic abdominal distention.  Social History: The patient is alone today.  Allergies: No Known Allergies  Scheduled Medications: . amiodarone  200 mg Oral Daily  . famotidine  20 mg Oral Daily  . feeding supplement (ENSURE ENLIVE)  237 mL Oral Q24H  . pantoprazole  40 mg Oral Daily  . sodium chloride  10-40 mL Intracatheter Q12H    Review of Systems: GENERAL:  Doing fine.  No fevers, sweats or weight loss. PERFORMANCE STATUS (ECOG):  2 HEENT:  No visual changes, runny nose, sore throat, mouth sores or tenderness. Lungs: No shortness of breath or cough.  No hemoptysis. Cardiac:  No chest pain, palpitations, orthopnea, or PND. GI:  Eating.  Ostomy working well.  No nausea, vomiting, diarrhea, constipation, melena or hematochezia. GU:  No urgency, frequency, dysuria, or hematuria. Musculoskeletal:  Wants to work with PT.  No back pain.  No joint pain.  No muscle tenderness. Extremities:  No pain or swelling. Skin:  No rashes or skin changes. Neuro:  No headache, numbness or weakness, balance or coordination issues. Endocrine:  No diabetes, thyroid issues, hot flashes or night sweats. Psych:  No mood changes, depression or anxiety. Pain:  No focal pain. Review of systems:  All other systems reviewed and found to be negative.  Physical Exam: Blood pressure 130/71, pulse 86, temperature 98.7 F (37.1 C), temperature source Oral, resp. rate 18, height 5' 11"  (1.803 m), weight 177 lb (80.287 kg), SpO2 94 %.  GENERAL:  Well developed, well nourished, sitting comfortably on the medical unit in no acute  distress. MENTAL STATUS:  Alert and oriented to person, place and time. HEAD:  Pearline Cables hair.  Mustache.  Normocephalic, atraumatic, face symmetric, no Cushingoid features. EYES:  Pupils equal round and reactive to light and accomodation.  No conjunctivitis or scleral icterus. ENT:  Oropharynx clear without lesion.  Tongue normal. Mucous membranes moist.  RESPIRATORY:  Clear to auscultation without rales, wheezes or rhonchi. CARDIOVASCULAR:  Regular rate and rhythm without murmur, rub or gallop. ABDOMEN:  Abdominal distension.  Well healing laparoscopic scars.  Right sided ostomy with dark liquid stool.  Soft, minimally tender in the left upper quadrant (stable). Active bowel sounds. No hepatosplenomegaly. Left upper quadrant mass.  SKIN:  Scattered upper extremity ecchymosis.  No rashes, ulcers or lesions. EXTREMITIES: No edema, no skin discoloration or tenderness.  No palpable cords. LYMPH NODES: No palpable cervical, supraclavicular, axillary or inguinal adenopathy  NEUROLOGICAL: Unremarkable. PSYCH:  Appropriate.  Results for orders placed or performed during the hospital encounter of 09/09/15 (from the past 48 hour(s))  CBC with Differential     Status: Abnormal   Collection Time: 09/21/15  6:03 AM  Result Value Ref Range   WBC 22.4 (H) 3.8 - 10.6 K/uL   RBC 3.37 (L) 4.40 - 5.90 MIL/uL   Hemoglobin 9.7 (L) 13.0 - 18.0 g/dL   HCT 29.5 (L) 40.0 - 52.0 %   MCV 87.4 80.0 - 100.0 fL   MCH 28.8 26.0 - 34.0 pg   MCHC 32.9 32.0 - 36.0 g/dL   RDW 27.6 (H) 11.5 - 14.5 %   Platelets 195 150 -  440 K/uL   Neutrophils Relative % 76 %   Neutro Abs 17.3 (H) 1.4 - 6.5 K/uL   Lymphocytes Relative 10 %   Lymphs Abs 2.2 1.0 - 3.6 K/uL   Monocytes Relative 7 %   Monocytes Absolute 1.5 (H) 0.2 - 1.0 K/uL   Eosinophils Relative 6 %   Eosinophils Absolute 1.3 (H) 0 - 0.7 K/uL   Basophils Relative 1 %   Basophils Absolute 0.1 0 - 0.1 K/uL  Basic metabolic panel     Status: Abnormal   Collection  Time: 09/21/15  6:03 AM  Result Value Ref Range   Sodium 141 135 - 145 mmol/L   Potassium 3.5 3.5 - 5.1 mmol/L   Chloride 109 101 - 111 mmol/L   CO2 24 22 - 32 mmol/L   Glucose, Bld 90 65 - 99 mg/dL   BUN 43 (H) 6 - 20 mg/dL   Creatinine, Ser 3.50 (H) 0.61 - 1.24 mg/dL   Calcium 8.0 (L) 8.9 - 10.3 mg/dL   GFR calc non Af Amer 17 (L) >60 mL/min   GFR calc Af Amer 19 (L) >60 mL/min    Comment: (NOTE) The eGFR has been calculated using the CKD EPI equation. This calculation has not been validated in all clinical situations. eGFR's persistently <60 mL/min signify possible Chronic Kidney Disease.    Anion gap 8 5 - 15  CBC with Differential     Status: Abnormal   Collection Time: 09/22/15  5:26 AM  Result Value Ref Range   WBC 19.1 (H) 3.8 - 10.6 K/uL   RBC 3.30 (L) 4.40 - 5.90 MIL/uL   Hemoglobin 9.5 (L) 13.0 - 18.0 g/dL   HCT 28.6 (L) 40.0 - 52.0 %   MCV 86.8 80.0 - 100.0 fL   MCH 29.0 26.0 - 34.0 pg   MCHC 33.3 32.0 - 36.0 g/dL   RDW 27.5 (H) 11.5 - 14.5 %   Platelets 164 150 - 440 K/uL   Neutrophils Relative % 76 %   Neutro Abs 14.6 (H) 1.4 - 6.5 K/uL   Lymphocytes Relative 9 %   Lymphs Abs 1.7 1.0 - 3.6 K/uL   Monocytes Relative 8 %   Monocytes Absolute 1.5 (H) 0.2 - 1.0 K/uL   Eosinophils Relative 6 %   Eosinophils Absolute 1.1 (H) 0 - 0.7 K/uL   Basophils Relative 1 %   Basophils Absolute 0.1 0 - 0.1 K/uL  Basic metabolic panel     Status: Abnormal   Collection Time: 09/22/15  5:26 AM  Result Value Ref Range   Sodium 140 135 - 145 mmol/L   Potassium 3.6 3.5 - 5.1 mmol/L   Chloride 110 101 - 111 mmol/L   CO2 24 22 - 32 mmol/L   Glucose, Bld 86 65 - 99 mg/dL   BUN 42 (H) 6 - 20 mg/dL   Creatinine, Ser 2.99 (H) 0.61 - 1.24 mg/dL   Calcium 7.7 (L) 8.9 - 10.3 mg/dL   GFR calc non Af Amer 20 (L) >60 mL/min   GFR calc Af Amer 23 (L) >60 mL/min    Comment: (NOTE) The eGFR has been calculated using the CKD EPI equation. This calculation has not been validated in all  clinical situations. eGFR's persistently <60 mL/min signify possible Chronic Kidney Disease.    Anion gap 6 5 - 15   Dg Abd 2 Views  09/21/2015  CLINICAL DATA:  Abdominal distension. EXAM: ABDOMEN - 2 VIEW COMPARISON:  CT of 09/09/2015 FINDINGS: Prior  median sternotomy. Upright view demonstrates multiple small bowel air-fluid levels. Probable left pleural effusion. Right lower quadrant colostomy. supine images demonstrate mild small bowel distention, at up to 3.6 cm. No gaseous distention of the colon. Distal contrast within the rectum and sigmoid. Aortic and branch vessel atherosclerosis. IMPRESSION: Mild small bowel distention with air-fluid levels. This could represent low-grade partial small bowel obstruction or postoperative adynamic ileus. No free intraperitoneal air or other acute complication. Probable small left pleural effusion, suboptimally evaluated. Electronically Signed   By: Abigail Miyamoto M.D.   On: 09/21/2015 13:42    Assessment:  Craig Page is a 69 y.o. male with metastatic medullary carcinoma of the colon presenting with a contained perforated colon at the splenic flexure.  He is status post diverting colostomy on 09/10/2015.  Port-a-cath was placed on 09/20/2015 for anticipated outpatient chemotherapy.  He has acute renal insufficiency felt secondary to ATN.  Creatinine is slowly improving with hydration.    Symptomatically, he feels good.  Exam reveals a distended abdomen (chronic).  Plan: 1. Hematology/Oncology:  Patient with metastatic colon cancer with known peritoneal metastasis as well as imaging findings worrisome for liver metastasis.  CEA is 16.  He is s/p diverting colostomy.  Anticipate outpatient FOLFOX chemotherapy.  Hematocrit stable.  WBC 19k (slightly improved).  Suspect due to perforated colon.  No fever. 2. Renal:  Acute renal insufficiency slowly resolving (Cr 2.99 today).  Per notes, discharge when Cr < 2.0.  Appreciate nephrology  assistance. 3. Disposition:  Continued inpatient stay secondary to renal insufficiency.   Lequita Asal, MD  09/22/2015, 11:44 AM

## 2015-09-22 NOTE — Progress Notes (Signed)
Patient had no c/o pain today VSS Patient is tolerating diet well Continues on IV fluids  Colostomy is putting out stool

## 2015-09-23 ENCOUNTER — Inpatient Hospital Stay: Payer: Commercial Managed Care - HMO

## 2015-09-23 LAB — BASIC METABOLIC PANEL
Anion gap: 8 (ref 5–15)
BUN: 38 mg/dL — AB (ref 6–20)
CALCIUM: 7.7 mg/dL — AB (ref 8.9–10.3)
CO2: 23 mmol/L (ref 22–32)
Chloride: 109 mmol/L (ref 101–111)
Creatinine, Ser: 2.64 mg/dL — ABNORMAL HIGH (ref 0.61–1.24)
GFR calc Af Amer: 27 mL/min — ABNORMAL LOW (ref >60)
GFR, EST NON AFRICAN AMERICAN: 23 mL/min — AB (ref >60)
Glucose, Bld: 96 mg/dL (ref 65–99)
POTASSIUM: 3.7 mmol/L (ref 3.5–5.1)
SODIUM: 140 mmol/L (ref 135–145)

## 2015-09-23 LAB — CBC WITH DIFFERENTIAL/PLATELET
BASOS ABS: 0.2 10*3/uL — AB (ref 0–0.1)
Basophils Relative: 1 %
EOS ABS: 1.2 10*3/uL — AB (ref 0–0.7)
EOS PCT: 6 %
HCT: 28.8 % — ABNORMAL LOW (ref 40.0–52.0)
Hemoglobin: 9.4 g/dL — ABNORMAL LOW (ref 13.0–18.0)
Lymphocytes Relative: 8 %
Lymphs Abs: 1.7 10*3/uL (ref 1.0–3.6)
MCH: 28.3 pg (ref 26.0–34.0)
MCHC: 32.5 g/dL (ref 32.0–36.0)
MCV: 87.1 fL (ref 80.0–100.0)
Monocytes Absolute: 1.8 10*3/uL — ABNORMAL HIGH (ref 0.2–1.0)
Monocytes Relative: 9 %
Neutro Abs: 16 10*3/uL — ABNORMAL HIGH (ref 1.4–6.5)
Neutrophils Relative %: 76 %
PLATELETS: 163 10*3/uL (ref 150–440)
RBC: 3.3 MIL/uL — AB (ref 4.40–5.90)
RDW: 28 % — AB (ref 11.5–14.5)
WBC: 20.8 10*3/uL — AB (ref 3.8–10.6)

## 2015-09-23 NOTE — Progress Notes (Addendum)
Nutrition Follow-up   INTERVENTION:   Coordination of Care: Spoke with MD, Choksi this am regarding nutrition poc as pt has had very poor po intake since admission. MD will look into adding an appetite stimulant at this time. Calorie count totals are minimal at best: Friday night dinner: Magic Cup 50% Saturday: 100% Ensure, 0% of all other meal trays Sunday: 5% of pizza, 50% of tea at lunch Daily Total Intake: < or equal to 350 kcals (17% of minimum estimated needs)  < or equal to 20g protein (27% of minimum estimated needs)  Meals and Snacks: Cater to patient preferences, continue vanilla shake as afternoon snack. Medical Food Supplement Therapy: continue as ordered Coordination of Care: recommend daily weights   NUTRITION DIAGNOSIS:   Inadequate oral intake related to inability to eat as evidenced by NPO status  GOAL:   Patient will meet greater than or equal to 90% of their needs; ongoing  MONITOR:    (Energy Intake, Electrolyte and renal Profile, Digestive System, Anthropometrics)  REASON FOR ASSESSMENT:   Malnutrition Screening Tool    ASSESSMENT:   Pt s/p port placement for chemotherapy, colostomy remains in place and functional.  Diet Order:  Diet regular Room service appropriate?: Yes; Fluid consistency:: Thin    Current Nutrition: Per RN Gerald Stabs, RN opened Ensure this am and pt drank about 25% of shake. RD encouraged pt to eat a morning snack and offered many choices, all of which pt declined.   Gastrointestinal Profile: Last BM:  output via colostomy, 09/22/2015   Medications: Pepcid, Protonix, 0.45%NS with KCl at 33mL/hr  Electrolyte/Renal Profile and Glucose Profile:   Recent Labs Lab 09/21/15 0603 09/22/15 0526 09/23/15 0525  NA 141 140 140  K 3.5 3.6 3.7  CL 109 110 109  CO2 24 24 23   BUN 43* 42* 38*  CREATININE 3.50* 2.99* 2.64*  CALCIUM 8.0* 7.7* 7.7*  GLUCOSE 90 86 96   Protein Profile: No results for input(s): ALBUMIN in the last 168  hours.   Weight Trend since Admission: Filed Weights   09/09/15 1402 09/17/15 0500 09/20/15 0522  Weight: 165 lb 1.6 oz (74.889 kg) 182 lb (82.555 kg) 177 lb (80.287 kg)     Skin:  Reviewed, no issues   BMI:  Body mass index is 24.7 kg/(m^2).  Estimated Nutritional Needs:   Kcal:  BEE: 1536kcals, TEE: (IF 1.1-1.3)(AF 1.2) [id number]  Protein:  75-82g protein (1.0-1.2g/kg)  Fluid:  1873-2267mL of fluid (25-44mL/kg)  EDUCATION NEEDS:   Education needs addressed   [place], RD, LDN Pager [phone]

## 2015-09-23 NOTE — Procedures (Signed)
Under US guidance, paracentesis was performed. No immediate complication. 

## 2015-09-23 NOTE — Progress Notes (Signed)
So was admitted in the hospital to see detail history and physical from the hospital note  [place] @ [facility] [phone]) [phone]  Fax:([phone]) [place] OB: 1946/08/13  MR#: [id number]  MGQ#:[id number]  Patient Care Team: Arlis Porta., MD as PCP - General (Family Medicine) Dionisio David, MD as Consulting Physician (Cardiology)  CHIEF COMPLAINT:   1 acute renal failure 2.  Perforated colon with contained perforation Possibility of primary colon cancer with metastases to the lymph node and liver metastases versus lymphoma recurrent disease 3.  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  Previous history of coronary artery disease being managed by cardiologist  Oncology Flowsheet 10/17/2014 10/18/2014 10/19/2014 09/10/2015  diazepam (VALIUM) PO - - 5 mg -  enoxaparin (LOVENOX) [place] 40 mg 40 mg - -  ondansetron (ZOFRAN) IV - - - -    INTERVAL HISTORY: He  is feeling somewhat stronger.  Making progress with physiotherapy.  No abdominal pain.  No nausea.  No vomiting.  Poor appetite increasing abdominal distention REVIEW OF SYSTEMS:   Patient continues to have increasing abdominal distention. Appetite is poor. Physiotherapy in progress but not making any significant headway in getting patient up and ambulating. Patient does have a bed available for rehabilitation All other systems have been reviewed  PAST MEDICAL HISTORY: Past Medical History  Diagnosis Date  . CAD (coronary artery disease)     3v  . Ischemic cardiomyopathy   . Chronic systolic heart failure ([place])   . HTN (hypertension)   . HLD (hyperlipidemia)   . Follicular lymphoma ([place])   . Myocardial infarction [facility]) 1992    treated with thrombolytics/notes 10/16/2014    PAST SURGICAL HISTORY: Past Surgical History  Procedure Laterality Date  . Hernia repair    . Umbilical hernia repair  1964  . Coronary artery bypass graft N/A  10/19/2014    Procedure: CORONARY ARTERY BYPASS GRAFTING (CABG);  Surgeon: Ivin Poot, MD;  Location: [place];  Service: Open Heart Surgery;  Laterality: N/A;  Times 3 using left internal mammary artery and endoscopically harvested left saphenous vein  . Intraoperative transesophageal echocardiogram N/A 10/19/2014    Procedure: INTRAOPERATIVE TRANSESOPHAGEAL ECHOCARDIOGRAM;  Surgeon: Ivin Poot, MD;  Location: [place];  Service: Open Heart Surgery;  Laterality: N/A;  . Laparoscopy N/A 09/10/2015    Procedure: LAPAROSCOPY DIAGNOSTIC;  Surgeon: Christene Lye, MD;  Location: [facility];  Service: General;  Laterality: N/A;  . Laparotomy N/A 09/10/2015    Procedure: EXPLORATORY LAPAROTOMY;  Surgeon: Christene Lye, MD;  Location: [facility];  Service: General;  Laterality: N/A;  . Transverse loop colostomy Right 09/10/2015    Procedure: TRANSVERSE LOOP COLOSTOMY;  Surgeon: Christene Lye, MD;  Location: [facility];  Service: General;  Laterality: Right;  . Portacath placement N/A 09/20/2015    Procedure: INSERTION PORT-A-CATH;  Surgeon: Christene Lye, MD;  Location: [facility];  Service: General;  Laterality: N/A;  Right     FAMILY HISTORY Family History  Problem Relation Age of Onset  . Heart failure Mother     deceased  . Cirrhosis Father     deceased    ADVANCED DIRECTIVES:  No flowsheet data found.  HEALTH MAINTENANCE: Social History  Substance Use Topics  . Smoking status: Former Smoker -- 1.00 packs/day for 50 years    Types: Cigarettes    Quit date: 09/24/2014  . Smokeless tobacco: Never Used  .  Alcohol Use: No      No Known Allergies  Current Facility-Administered Medications  Medication Dose Route Frequency Provider Last Rate Last Dose  . 0.45 % NaCl with KCl 20 mEq / L infusion   Intravenous Continuous Hillary Bow, MD 50 mL/hr at 09/23/15 0530    . acetaminophen (TYLENOL) tablet 650 mg  650 mg Oral Q6H PRN Seeplaputhur Robinette Haines, MD       Or    . acetaminophen (TYLENOL) suppository 650 mg  650 mg Rectal Q6H PRN Seeplaputhur Robinette Haines, MD      . amiodarone (PACERONE) tablet 200 mg  200 mg Oral Daily Seeplaputhur Robinette Haines, MD   200 mg at 09/23/15 1109  . famotidine (PEPCID) tablet 20 mg  20 mg Oral Daily Lytle Butte, MD   20 mg at 09/23/15 1109  . feeding supplement (ENSURE ENLIVE) (ENSURE ENLIVE) liquid 237 mL  237 mL Oral Q24H Forest Gleason, MD   237 mL at 09/23/15 0856  . fentaNYL (SUBLIMAZE) injection 25 mcg  25 mcg Intravenous Q5 min PRN Alvin Critchley, MD      . morphine 2 MG/ML injection 2 mg  2 mg Intravenous Q3H PRN Seeplaputhur Robinette Haines, MD   2 mg at 09/23/15 1100  . ondansetron (ZOFRAN) 4 mg in sodium chloride 0.9 % 50 mL IVPB   Intravenous Q4H PRN Forest Gleason, MD      . ondansetron (ZOFRAN) injection 4 mg  4 mg Intravenous Once PRN Alvin Critchley, MD      . ondansetron [facility]) tablet 4-8 mg  4-8 mg Oral Q8H PRN Seeplaputhur Robinette Haines, MD      . oxyCODONE (Oxy IR/ROXICODONE) immediate release tablet 5-10 mg  5-10 mg Oral Q4H PRN Christene Lye, MD   10 mg at 09/23/15 1218  . pantoprazole (PROTONIX) EC tablet 40 mg  40 mg Oral Daily Seeplaputhur Robinette Haines, MD   40 mg at 09/23/15 1109  . pentafluoroprop-tetrafluoroeth (GEBAUERS) aerosol   Topical PRN Forest Gleason, MD      . simethicone (MYLICON) chewable tablet 80 mg  80 mg Oral TID PRN Christene Lye, MD   80 mg at 09/11/15 2035  . sodium chloride 0.9 % injection 10-40 mL  10-40 mL Intracatheter Q12H Forest Gleason, MD   10 mL at 09/22/15 2232  . sodium chloride 0.9 % injection 10-40 mL  10-40 mL Intracatheter PRN Forest Gleason, MD        OBJECTIVE:  Filed Vitals:   09/23/15 1059  BP: 108/68  Pulse: 80  Temp: 97.9 F (36.6 C)  Resp: 17     Body mass index is 24.7 kg/(m^2).    ECOG FS:2 - Symptomatic, <50% confined to bed  PHYSICAL EXAM: Patient is lying in the bed not very uncomfortable. Performance status is 2 Lungs occasional crepitations.  Cardiac:  Irregular heart sounds soft systolic murmur.  Abdomen: Swollen.  Ascites.  Palpable mass in the left upper abdominal area.  Also the present.  Lower extremity edema. Neurological system no localizing sign Skin: No rash Lymphatic system: Supraclavicular, cervical, axillary, inguinal lymph nodes are not palpable Head exam was generally normal. There was no scleral icterus or corneal arcus. Mucous membranes were moist. Colostomy has blurred at this point in time.  But patient is not acutely bleeding   LAB RESULTS:  CBC Latest Ref Rng 09/23/2015 09/22/2015  WBC 3.8 - 10.6 K/uL 20.8(H) 19.1(H)  Hemoglobin 13.0 - 18.0 g/dL 9.4(L) 9.5(L)  Hematocrit 40.0 - 52.0 %  28.8(L) 28.6(L)  Platelets 150 - 440 K/uL 163 164    Admission on 09/09/2015  No results displayed because visit has over 200 results.         STUDIES: Ct Abdomen Pelvis Wo Contrast  09/09/2015  CLINICAL DATA:  Initial encounter for increasing left upper quadrant pain in a patient with history of lymphoma. 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 EXAM: CT ABDOMEN AND PELVIS WITHOUT CONTRAST TECHNIQUE: Multidetector CT imaging of the abdomen and pelvis was performed following the standard protocol without IV contrast. COMPARISON:  08/29/2015. FINDINGS: Lower chest: Emphysema with chronic interstitial changes noted in the lung bases. A posterior focus of collapse/consolidation is seen in the left lower lobe. 2.1 cm nodular pleural-based lesion is seen at the dome of the left hemidiaphragm. Hepatobiliary: Nodularity along the anterior liver margin raises the question of cirrhosis. There is a new 2.2 cm low-density lesion in the lateral segment left liver (image 13 series 2). A new 2.4 cm lesion is seen in the inferior right liver (image 30). These are suspicious for metastatic disease given the rapid interval appearance. There is no evidence for gallstones, gallbladder wall thickening, or  pericholecystic fluid. No intrahepatic or extrahepatic biliary dilation. Pancreas: No focal mass lesion. No dilatation of the main duct. No intraparenchymal cyst. No peripancreatic edema. Spleen: No splenomegaly. No focal mass lesion. Adrenals/Urinary Tract: Right adrenal gland is unremarkable. 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 Kidneys are unremarkable. No evidence for hydroureter. The urinary bladder appears normal for the degree of distention. Stomach/Bowel: Tiny hiatal hernia noted. Stomach otherwise unremarkable. No small bowel wall thickening. No small bowel dilatation. The terminal ileum is normal. The appendix is normal. Large lesion involving the splenic flexure of the colon again noted. No evidence for obstruction. 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 Small nodules are seen along the margin of the large splenic flexure lesion with a conglomeration of lymph nodes identified in the central small bowel mesentery measuring 2.9 x 3.4 cm. Reproductive: Prostate gland is mildly enlarged. Other: Small volume intraperitoneal free fluid is evident. Musculoskeletal: Bone windows reveal no worrisome lytic or sclerotic osseous lesions. IMPRESSION: 1. No substantial change in the large necrotic/perforated splenic flexure mass with adjacent nodularity and central mesenteric lymphadenopathy. 2. Interval development of hypo attenuating lesions in the liver parenchyma, suspicious for metastatic disease. 3. Interval development of small volume intraperitoneal ascites 4. 3.9 cm abdominal aortic aneurysm Electronically Signed   By: Misty Stanley M.D.   On: 09/09/2015 16:39   Dg Chest 1 View  09/20/2015  CLINICAL DATA:  Postop port placement EXAM: CHEST 1 VIEW COMPARISON:  09/14/2015 FINDINGS: Prior CABG. Right Port-A-Cath is in place with the tip at the cavoatrial junction. No pneumothorax Interval removal of left central line. There is cardiomegaly with vascular  congestion. Left lower lobe atelectasis or infiltrate. Right lung is clear. No visible effusions. IMPRESSION: Right port placement with the tip at the cavoatrial junction. No pneumothorax. Cardiomegaly, vascular congestion. Left lower lobe atelectasis or infiltrate. Electronically Signed   By: Rolm Baptise M.D.   On: 09/20/2015 09:01   Dg Chest 2 View  09/14/2015  CLINICAL DATA:  Hypoxia.  Lymphoma. EXAM: CHEST  2 VIEW COMPARISON:  12/03/2014 chest radiograph. FINDINGS: Median sternotomy wires are aligned and intact. Left internal jugular central venous catheter terminates in the upper third of the superior vena cava. Lung volumes are low. The cardiomediastinal silhouette is likely  stable accounting for portable technique, lordotic positioning and low lung volumes, with mild cardiomegaly. No pneumothorax. Small left pleural effusion. No right pleural effusion. There is mild pulmonary edema. Hazy left lung base opacities likely represent atelectasis. IMPRESSION: 1. Mild congestive heart failure. 2. Small left pleural effusion. 3. Hazy left basilar lung opacities, likely atelectasis. Electronically Signed   By: Ilona Sorrel M.D.   On: 09/14/2015 12:10   US Renal  09/18/2015  CLINICAL DATA:  Acute renal failure EXAM: RENAL / URINARY TRACT ULTRASOUND COMPLETE COMPARISON:  09/09/2015 FINDINGS: Right Kidney: Length: 11.3 cm. Echogenicity within normal limits. No mass or hydronephrosis visualized. Left Kidney: Length: 11.5 cm. Some scarring is noted in the lower pole of left kidney similar to that seen on prior CT. Bladder: Appears normal for degree of bladder distention. Ascites is noted similar to that seen on recent CT examination. IMPRESSION: No acute renal abnormality noted. Repair of ascites similar to that seen on prior CT Electronically Signed   By: Inez Catalina M.D.   On: 09/18/2015 12:24   Nm Pet Image Restag (ps) Skull Base To Thigh  08/29/2015  CLINICAL DATA:  Subsequent treatment strategy for  lymphoma. Abdominal pain, diarrhea, leukocytosis, and splenomegaly. EXAM: NUCLEAR MEDICINE PET SKULL BASE TO THIGH TECHNIQUE: 11.2 mCi F-18 FDG was injected intravenously. Full-ring PET imaging was performed from the skull base to thigh after the radiotracer. CT data was obtained and used for attenuation correction and anatomic localization. FASTING BLOOD GLUCOSE:  Value: 92 mg/dl COMPARISON:  Chest CT on 10/17/2014 and PET-CT on 02/12/2014 FINDINGS: NECK No hypermetabolic lymph nodes in the neck. CHEST A 6 mm hypermetabolic mediastinal lymph node is seen just posterior to the lower thoracic esophagus on image 89 of series 3, which is hypermetabolic with SUV max of 7.1. This is new since previous study. No other hypermetabolic lymph nodes identified within the thorax. No suspicious pulmonary nodules seen on CT. Emphysema noted. ABDOMEN/PELVIS Small hypermetabolic soft tissue nodules are seen along the capsular surface of the liver and spleen. Largest is seen along the anterior aspect of the left hepatic lobe measuring 1.6 cm on image 121 of series 3, with SUV max of 8.2. There is also a tiny hypermetabolic focus in the left pelvic cul-de-sac which has an SUV max of 5.0 on image 63 of series 4. Trace amount of ascites is seen. These findings are highly suspicious with peritoneal carcinoma. A 10.2 cm mass is seen in the left upper quadrant which shows a thick hypermetabolic rim and stool or other particular debris centrally. This involves the splenic flexure of the colon and is new since previous study. SUV max measures 26.5. This is suspicious for a perforated colon carcinoma. Hypermetabolic lymphadenopathy is seen in the central abdominal mesentery medial to this mass, which measures 2.7 cm on image 145/series 3 with SUV max of 9.6. There is also a 8 mm hypermetabolic retroperitoneal lymph node in the aortocaval space on image 151/series 3, which has SUV max of 14.0. No hypermetabolic lymphadenopathy seen within the  pelvis. 4.0 cm infrarenal abdominal aortic aneurysm is again seen, without significant change. SKELETON No focal hypermetabolic activity to suggest skeletal metastasis. IMPRESSION: 10 cm left upper quadrant peripherally hypermetabolic mass with central stool or necrotic debris which involves the splenic flexure the colon. This is suspicious for perforated colon carcinoma. Hypermetabolic peritoneal soft tissue nodules in the abdomen and pelvis and minimal ascites, consistent with peritoneal metastatic disease. Mild hypermetabolic mesenteric and retroperitoneal lymphadenopathy, consistent with metastatic disease.  6 mm hypermetabolic mediastinal lymph node just posterior to the lower thoracic esophagus. Metastatic disease cannot be excluded. Stable 4.0 cm infrarenal abdominal aortic aneurysm. Electronically Signed   By: Earle Gell M.D.   On: 08/29/2015 14:51   Dg Abd 2 Views  09/21/2015  CLINICAL DATA:  Abdominal distension. EXAM: ABDOMEN - 2 VIEW COMPARISON:  CT of 09/09/2015 FINDINGS: Prior median sternotomy. Upright view demonstrates multiple small bowel air-fluid levels. Probable left pleural effusion. Right lower quadrant colostomy. supine images demonstrate mild small bowel distention, at up to 3.6 cm. No gaseous distention of the colon. Distal contrast within the rectum and sigmoid. Aortic and branch vessel atherosclerosis. IMPRESSION: Mild small bowel distention with air-fluid levels. This could represent low-grade partial small bowel obstruction or postoperative adynamic ileus. No free intraperitoneal air or other acute complication. Probable small left pleural effusion, suboptimally evaluated. Electronically Signed   By: Abigail Miyamoto M.D.   On: 09/21/2015 13:42   Dg C-arm 1-60 Min-no Report  09/20/2015  CLINICAL DATA: Port placement C-ARM 1-60 MINUTES Fluoroscopy was utilized by the requesting physician.  No radiographic interpretation.    ASSESSMENT: 1.  Acute renal failure.  Intravenous fluid  is resulting in to third spacing and not effectively raising intravascular fluid balance. Creatinine is improving is 2.64 Abdominal distention possibility of ascites we try ultrasound-guided abdominal paracentesis Patient has a port placement Consider possibility of rehabilitation prior to starting chemotherapy  CBC and metabolic panel has been ordered for tomorrow   Patient expressed understanding and was in agreement with this plan. He also understands that He can call clinic at any time with any questions, concerns, or complaints.    No matching staging information was found for the patient.  Forest Gleason, MD   09/23/2015 1:35 PM

## 2015-09-23 NOTE — Plan of Care (Addendum)
Problem: Discharge Progression Outcomes Goal: Other Discharge Outcomes/Goals Outcome: Progressing Patient c/o ABD pain x 2 relieved with prn morphine 2 mg and oxycodone 10mg   Patients VSS Patient still had poor appetite encouraged PO intake  Patient continues on IB fluids infusing through port Patient had paracentesis today 1.8 liters removed LRQ Paracentesis site dry and intact  Possible discharge to rehab tomorrow

## 2015-09-23 NOTE — Progress Notes (Signed)
Pt disoriented to time only. No complaints of pain or discomfort. VSS.  IV fluid continue. Encouraged pt to do as many ADL's as possible for himself. Pt is able to pull himself up in bed, he states, however that he is too weak to sit on the edge of the bed.

## 2015-09-23 NOTE — Progress Notes (Signed)
[facility]  ROUNDING NOTE   Subjective:  Cr down to 2.64 (2.99) today. Good UOP noted.  Cr has been trending down slowly.    Objective:  Vital signs in last 24 hours:  Temp:  [97.9 F (36.6 C)-98.6 F (37 C)] 97.9 F (36.6 C) (10/17 1059) Pulse Rate:  [75-80] 80 (10/17 1059) Resp:  [17-20] 17 (10/17 1059) BP: (98-114)/(60-68) 108/68 mmHg (10/17 1059) SpO2:  [94 %-97 %] 94 % (10/17 1059)  Weight change:  Filed Weights   09/09/15 1402 09/17/15 0500 09/20/15 0522  Weight: 74.889 kg (165 lb 1.6 oz) 82.555 kg (182 lb) 80.287 kg (177 lb)    Intake/Output: I/O last 3 completed shifts: In: -  Out: 4010 [Urine:1820]   Intake/Output this shift:     Physical Exam: General: NAD  Head: Normocephalic, atraumatic. moist mucosal membranes  Eyes: Anicteric  Neck: Supple, trachea midline  Lungs:  Clear to auscultation normal effort  Heart: Regular rate and rhythm  Abdomen:  Soft, nontender, +right upper quadrant ostomy, distended   Extremities: no peripheral edema.  Neurologic: Nonfocal, moving all four extremities  Skin: No lesions       Basic Metabolic Panel:  Recent Labs Lab 09/19/15 0649 09/20/15 0516 09/21/15 0603 09/22/15 0526 09/23/15 0525  NA 136 140 141 140 140  K 3.4* 3.5 3.5 3.6 3.7  CL 103 107 109 110 109  CO2 25 24 24 24 23   GLUCOSE 91 96 90 86 96  BUN 44* 47* 43* 42* 38*  CREATININE 4.06* 3.77* 3.50* 2.99* 2.64*  CALCIUM 7.6* 8.0* 8.0* 7.7* 7.7*    Liver Function Tests: No results for input(s): AST, ALT, ALKPHOS, BILITOT, PROT, ALBUMIN in the last 168 hours. No results for input(s): LIPASE, AMYLASE in the last 168 hours. No results for input(s): AMMONIA in the last 168 hours.  CBC:  Recent Labs Lab 09/19/15 0649 09/20/15 0516 09/21/15 0603 09/22/15 0526 09/23/15 0525  WBC 24.1* 24.2* 22.4* 19.1* 20.8*  NEUTROABS 18.9* 18.4* 17.3* 14.6* 16.0*  HGB 8.9* 9.9* 9.7* 9.5* 9.4*  HCT 27.4* 30.3* 29.5* 28.6* 28.8*  MCV 85.4 86.3  87.4 86.8 87.1  PLT 214 217 195 164 163    Cardiac Enzymes: No results for input(s): CKTOTAL, CKMB, CKMBINDEX, TROPONINI in the last 168 hours.  BNP: Invalid input(s): POCBNP  CBG: No results for input(s): GLUCAP in the last 168 hours.  Microbiology: Results for orders placed or performed during the hospital encounter of 09/09/15  Culture, blood (routine x 2)     Status: None   Collection Time: 09/09/15  2:59 PM  Result Value Ref Range Status   Specimen Description BLOOD RIGHT ASSIST CONTROL  Final   Special Requests AER 10ML ANA 10ML  Final   Culture NO GROWTH 5 DAYS  Final   Report Status 09/14/2015 FINAL  Final  Culture, blood (routine x 2)     Status: None   Collection Time: 09/09/15  3:38 PM  Result Value Ref Range Status   Specimen Description BLOOD RIGHT ARM  Final   Special Requests AER 10ML ANA 10ML  Final   Culture NO GROWTH 5 DAYS  Final   Report Status 09/14/2015 FINAL  Final  Urine culture     Status: None   Collection Time: 09/09/15  6:34 PM  Result Value Ref Range Status   Specimen Description URINE, CLEAN CATCH  Final   Special Requests NONE  Final   Culture NO GROWTH 1 DAY  Final   Report  Status 09/11/2015 FINAL  Final    Coagulation Studies: No results for input(s): LABPROT, INR in the last 72 hours.  Urinalysis: No results for input(s): COLORURINE, LABSPEC, PHURINE, GLUCOSEU, HGBUR, BILIRUBINUR, KETONESUR, PROTEINUR, UROBILINOGEN, NITRITE, LEUKOCYTESUR in the last 72 hours.  Invalid input(s): APPERANCEUR    Imaging: No results found.   Medications:   . 0.45 % NaCl with KCl 20 mEq / L 50 mL/hr at 09/23/15 0530   . amiodarone  200 mg Oral Daily  . famotidine  20 mg Oral Daily  . feeding supplement (ENSURE ENLIVE)  237 mL Oral Q24H  . pantoprazole  40 mg Oral Daily  . sodium chloride  10-40 mL Intracatheter Q12H   acetaminophen **OR** acetaminophen, fentaNYL (SUBLIMAZE) injection, morphine injection, ondansetron (ZOFRAN) IVPB [facility] +/-  dexamethasone, ondansetron (ZOFRAN) IV, ondansetron, oxyCODONE, pentafluoroprop-tetrafluoroeth, simethicone, sodium chloride  Assessment/ Plan:  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 Acute Renal Failure: most likely ATN. Cr trending down slowly, original UA showed no proteinuria. - Cr continues to trend down, currently Cr down to 2.66 good UOP noted,  - continue to monitor - 1/2 NS at 50 cc/hr  2. Hypertension: normotensive off antihypertensives.   3. Anemia D64.9: with persistent leukocytosis. hgb 9.4 and relatively stable.            LOS: [place] 10/17/20161:31 PM

## 2015-09-23 NOTE — Progress Notes (Signed)
CSW followed up with Pt to discuss final discharge plans now that Pt is closer to discharge and still too weak to return home. Pt states that he is in agreement with STR at dc and wants his wife to decide on the facility. CSW followed up with his wife via cell phone. Wife has decided on [facility] for SNF at dc.  Pt will dc via ems on day of dc. CSW will continue to follow. FL2 on chart for MD signature.   Toma Copier, [place]

## 2015-09-24 ENCOUNTER — Other Ambulatory Visit: Payer: Self-pay | Admitting: *Deleted

## 2015-09-24 DIAGNOSIS — C189 Malignant neoplasm of colon, unspecified: Secondary | ICD-10-CM

## 2015-09-24 LAB — BASIC METABOLIC PANEL
Anion gap: 8 (ref 5–15)
BUN: 35 mg/dL — AB (ref 6–20)
CALCIUM: 7.5 mg/dL — AB (ref 8.9–10.3)
CO2: 22 mmol/L (ref 22–32)
CREATININE: 2.19 mg/dL — AB (ref 0.61–1.24)
Chloride: 112 mmol/L — ABNORMAL HIGH (ref 101–111)
GFR calc non Af Amer: 29 mL/min — ABNORMAL LOW (ref >60)
GFR, EST AFRICAN AMERICAN: 34 mL/min — AB (ref >60)
Glucose, Bld: 90 mg/dL (ref 65–99)
Potassium: 3.7 mmol/L (ref 3.5–5.1)
SODIUM: 142 mmol/L (ref 135–145)

## 2015-09-24 MED ORDER — PREDNISONE 20 MG PO TABS
20.0000 mg | ORAL_TABLET | Freq: Every day | ORAL | Status: DC
  Administered 2015-09-24: 20 mg via ORAL
  Filled 2015-09-24: qty 1

## 2015-09-24 MED ORDER — HEPARIN SOD (PORK) LOCK FLUSH 100 UNIT/ML IV SOLN
500.0000 [IU] | Freq: Once | INTRAVENOUS | Status: AC
  Administered 2015-09-24: 500 [IU] via INTRAVENOUS
  Filled 2015-09-24: qty 5

## 2015-09-24 NOTE — Care Management Important Message (Signed)
Important Message  Patient Details  Name: KYLEY LAUREL MRN: [id number] Date of Birth: 02-Nov-1946   Medicare Important Message Given:  Yes-third notification given    Juliann Pulse A [place]mond 09/24/2015, 10:25 AM

## 2015-09-24 NOTE — Plan of Care (Signed)
Pt d/ced to [facility].  Called report to Maudie Mercury - going to [facility] 136.  Pt had no c/o pain.  Very poor PO intake.  Refuses Ensure supplement.  Good output from new colostomy.  Abd still distended although paracentesis yesterday removed 1.8L.  Pt is very withdrawn.  Family at bedside.  Pt will be transferred via EMS.

## 2015-09-24 NOTE — Discharge Summary (Signed)
Physician Discharge Summary  Patient ID: Craig Page MRN: [id number] DOB/AGE: 04-13-1946 69 y.o.  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  Perforated colon Patient underwent diverting colostomy.  Extensive disease was found. Patient also underwent intravenous antibiotic Leukocytosis secondary to malignancy persist Surgical consultation was obtained and patient underwent exploratory laparotomy (Dr. Jamal Collin  was consulted)  2.  Acute renal failure most likely due to acute the tubular necrosis Nephrology consulted patient gradually improved creatinine improved from 5 mg to 2.1 3.  Mal nutrition  Secondary to malignancy and inability to eat Dietitian was consulted and patient may require prednisone as an appetite stimulant Leukocytosis secondary to malignancy Possibility of chemotherapy was discuss once patient's general condition improves Consults:  Surgical consult with Dr. Jamal Collin Nephrology consult with Dr. Deidre Ala Dietitian consultation [facility]  Consultation Cardiology consult Significant Diagnostic Studies: { CT scan Lab Microbiology   Treatments: IV hydration Status post diverting  Colostomy IV antibiotics Physiotherapy  Discharge Exam: Blood pressure 119/71, pulse 79, temperature 98.3 F (36.8 C), temperature source Oral, resp. rate 19, height 5\' 11"  (1.803 m), weight 176 lb 8 oz (80.06 kg), SpO2 97 %. General  appearance: alert Gen. status: Patient still in the bed Pale looking Abdomen is mildly distended lower extremity no swelling No palpable lymph node Vital signs are stable Disposition: The patient is going to rehabilitation for physiotherapy Discharge Instructions    Care order/instruction    Complete by:  As directed   Transfuse Parameters     Complete patient signature process for consent form    Complete by:  As directed      Practitioner attestation of consent    Complete by:  As directed   I, the ordering practitioner, attest that I have discussed with the patient the benefits, risks, side effects, alternatives, likelihood of achieving goals and potential problems during recovery for the procedure listed.  Procedure:  Blood Product(s)     Type and screen    Complete by:  As directed             Medication List    ASK your doctor about these medications        amiodarone 200 MG tablet  Commonly known as:  PACERONE  Take 1 tablet (200 mg total) by mouth daily.     carvedilol 3.125 MG tablet  Commonly known as:  COREG  TAKE 1 TABLET BY MOUTH TWICE DAILY.     lisinopril 2.5 MG tablet  Commonly known as:  PRINIVIL,ZESTRIL  TAKE 1 TABLET BY MOUTH TWICE DAILY     ondansetron 4 MG tablet  Commonly known as:  ZOFRAN  Take 1-2 tablets (4-8 mg total) by mouth every 8 (eight) hours as needed for nausea (not responsive to prochlorperazine (COMPAZINE)).     pantoprazole 40 MG tablet  Commonly known as:  PROTONIX  Take 40 mg by mouth daily.     simethicone 80 MG chewable tablet  Commonly known as:  MYLICON  Chew 1 tablet by mouth 3 (three) times daily as needed.     spironolactone 25 MG tablet  Commonly known as:  ALDACTONE  Take 0.5 tablets (12.5 mg total) by mouth daily.       prednisone 20 mg daily in the morning  Total time spent more than   45 minutes coordinating care with social worker and discharging patient and making appointment  Patient will be seen by me  next week for possibility of chemotherapy  Signed: Forest Gleason 09/24/2015, 8:23 AM

## 2015-09-24 NOTE — Plan of Care (Signed)
Problem: Discharge Progression Outcomes Goal: Other Discharge Outcomes/Goals Outcome: Progressing Plan of care progress of goals No complaints of pain or discomfort VSS, voiding without difficulty, positive soft stool from colostomy Possible transfer to [place] today

## 2015-09-24 NOTE — Progress Notes (Signed)
Subjective:  Cr down to 2.19 (2.99) today. Good UOP noted. 1170 cc Cr has been trending down slowly.    Objective:  Vital signs in last 24 hours:  Temp:  [97.9 F (36.6 C)-98.7 F (37.1 C)] 98.3 F (36.8 C) (10/18 0509) Pulse Rate:  [69-85] 74 (10/18 0854) Resp:  [18-20] 19 (10/17 1632) BP: (85-123)/(51-71) 123/62 mmHg (10/18 0854) SpO2:  [94 %-97 %] 97 % (10/18 0509) Weight:  [80.06 kg (176 lb 8 oz)] 80.06 kg (176 lb 8 oz) (10/17 1407)  Weight change:  Filed Weights   09/17/15 0500 09/20/15 0522 09/23/15 1407  Weight: 82.555 kg (182 lb) 80.287 kg (177 lb) 80.06 kg (176 lb 8 oz)    Intake/Output: I/O last 3 completed shifts: In: 0  Out: 1700 [Urine:1250; Stool:450]   Intake/Output this shift:     Physical Exam: General: NAD  Head: Normocephalic, atraumatic. moist mucosal membranes  Eyes: Anicteric  Neck: Supple, trachea midline  Lungs:  Clear to auscultation normal effort  Heart: Regular rate and rhythm  Abdomen:  Soft, nontender, +right upper quadrant ostomy, distended   Extremities: no peripheral edema.  Neurologic: Nonfocal, moving all four extremities  Skin: No lesions       Basic Metabolic Panel:  Recent Labs Lab 09/20/15 0516 09/21/15 0603 09/22/15 0526 09/23/15 0525 09/24/15 0518  NA 140 141 140 140 142  K 3.5 3.5 3.6 3.7 3.7  CL 107 109 110 109 112*  CO2 24 24 24 23 22   GLUCOSE 96 90 86 96 90  BUN 47* 43* 42* 38* 35*  CREATININE 3.77* 3.50* 2.99* 2.64* 2.19*  CALCIUM 8.0* 8.0* 7.7* 7.7* 7.5*    Liver Function Tests: No results for input(s): AST, ALT, ALKPHOS, BILITOT, PROT, ALBUMIN in the last 168 hours. No results for input(s): LIPASE, AMYLASE in the last 168 hours. No results for input(s): AMMONIA in the last 168 hours.  CBC:  Recent Labs Lab 09/19/15 0649 09/20/15 0516 09/21/15 0603 09/22/15 0526 09/23/15 0525  WBC 24.1* 24.2* 22.4* 19.1* 20.8*  NEUTROABS 18.9* 18.4* 17.3* 14.6* 16.0*  HGB 8.9* 9.9* 9.7* 9.5* 9.4*  HCT  27.4* 30.3* 29.5* 28.6* 28.8*  MCV 85.4 86.3 87.4 86.8 87.1  PLT 214 217 195 164 163    Cardiac Enzymes: No results for input(s): CKTOTAL, CKMB, CKMBINDEX, TROPONINI in the last 168 hours.  BNP: Invalid input(s): POCBNP  CBG: No results for input(s): GLUCAP in the last 168 hours.  Microbiology: Results for orders placed or performed during the hospital encounter of 09/09/15  Culture, blood (routine x 2)     Status: None   Collection Time: 09/09/15  2:59 PM  Result Value Ref Range Status   Specimen Description BLOOD RIGHT ASSIST CONTROL  Final   Special Requests AER 10ML ANA 10ML  Final   Culture NO GROWTH 5 DAYS  Final   Report Status 09/14/2015 FINAL  Final  Culture, blood (routine x 2)     Status: None   Collection Time: 09/09/15  3:38 PM  Result Value Ref Range Status   Specimen Description BLOOD RIGHT ARM  Final   Special Requests AER 10ML ANA 10ML  Final   Culture NO GROWTH 5 DAYS  Final   Report Status 09/14/2015 FINAL  Final  Urine culture     Status: None   Collection Time: 09/09/15  6:34 PM  Result Value Ref Range Status   Specimen Description URINE, CLEAN CATCH  Final   Special Requests NONE  Final  Culture NO GROWTH 1 DAY  Final   Report Status 09/11/2015 FINAL  Final    Coagulation Studies: No results for input(s): LABPROT, INR in the last 72 hours.  Urinalysis: No results for input(s): COLORURINE, LABSPEC, PHURINE, GLUCOSEU, HGBUR, BILIRUBINUR, KETONESUR, PROTEINUR, UROBILINOGEN, NITRITE, LEUKOCYTESUR in the last 72 hours.  Invalid input(s): APPERANCEUR    Imaging: US Paracentesis  09/23/2015  CLINICAL DATA:  Ascites. EXAM: ULTRASOUND GUIDED PARACENTESIS COMPARISON:  None. PROCEDURE: An ultrasound guided paracentesis was thoroughly discussed with the patient and questions answered. The benefits, risks, alternatives and complications were also discussed. The patient understands and wishes to proceed with the procedure. Written consent was obtained.  Ultrasound was performed to localize and mark an adequate pocket of fluid in the right lower quadrant of the abdomen. The area was then prepped and draped in the normal sterile fashion. 1% Lidocaine was used for local anesthesia. Under ultrasound guidance a Safe-T-Centesis catheter was introduced. Paracentesis was performed. The catheter was removed and a dressing applied. COMPLICATIONS: None immediate. FINDINGS: A total of approximately 1.8 L of serous fluid was removed. A fluid sample was not sent for laboratory analysis. IMPRESSION: Successful ultrasound guided paracentesis yielding 1.8 L of ascites. Electronically Signed   By: Craig Page, M.D.   On: 09/23/2015 15:50     Medications:   . 0.45 % NaCl with KCl 20 mEq / L 50 mL/hr at 09/24/15 0132   . amiodarone  200 mg Oral Daily  . famotidine  20 mg Oral Daily  . feeding supplement (ENSURE ENLIVE)  237 mL Oral Q24H  . pantoprazole  40 mg Oral Daily  . predniSONE  20 mg Oral Q breakfast  . sodium chloride  10-40 mL Intracatheter Q12H   acetaminophen **OR** acetaminophen, fentaNYL (SUBLIMAZE) injection, morphine injection, ondansetron (ZOFRAN) IVPB [facility] +/- dexamethasone, ondansetron (ZOFRAN) IV, ondansetron, oxyCODONE, pentafluoroprop-tetrafluoroeth, simethicone, sodium chloride  Assessment/ Plan:  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 Acute Renal Failure: most likely ATN. Cr trending down slowly, original UA showed no proteinuria. - Cr continues to trend down, currently Cr down to 2.19 good UOP noted,  - continue to monitor - f/u outpatient  2. Hypertension: normotensive off antihypertensives.   3. Anemia D64.9: with persistent leukocytosis. hgb 9.4 and  relatively stable.            LOS: Craig Page 10/18/201610:59 AM

## 2015-09-24 NOTE — Progress Notes (Signed)
   SUBJECTIVE: Pt states he is going to rehab today. No CP, SOB or palpitations.   Filed Vitals:   09/23/15 1632 09/23/15 2150 09/24/15 0509 09/24/15 0854  BP: 109/69 105/51 119/71 123/62  Pulse: 85 74 79 74  Temp: 98.7 F (37.1 C) 98.1 F (36.7 C) 98.3 F (36.8 C)   TempSrc:  Oral Oral   Resp: 19     Height:      Weight:      SpO2: 97% 95% 97%     Intake/Output Summary (Last 24 hours) at 09/24/15 0904 Last data filed at 09/24/15 0522  Gross per 24 hour  Intake      0 ml  Output    850 ml  Net   -850 ml    LABS: Basic Metabolic Panel:  Recent Labs  09/23/15 0525 09/24/15 0518  NA 140 142  K 3.7 3.7  CL 109 112*  CO2 23 22  GLUCOSE 96 90  BUN 38* 35*  CREATININE 2.64* 2.19*  CALCIUM 7.7* 7.5*   Liver Function Tests: No results for input(s): AST, ALT, ALKPHOS, BILITOT, PROT, ALBUMIN in the last 72 hours. No results for input(s): LIPASE, AMYLASE in the last 72 hours. CBC:  Recent Labs  09/22/15 0526 09/23/15 0525  WBC 19.1* 20.8*  NEUTROABS 14.6* 16.0*  HGB 9.5* 9.4*  HCT 28.6* 28.8*  MCV 86.8 87.1  PLT 164 163   Cardiac Enzymes: No results for input(s): CKTOTAL, CKMB, CKMBINDEX, TROPONINI in the last 72 hours. BNP: Invalid input(s): POCBNP D-Dimer: No results for input(s): DDIMER in the last 72 hours. Hemoglobin A1C: No results for input(s): HGBA1C in the last 72 hours. Fasting Lipid Panel: No results for input(s): CHOL, HDL, LDLCALC, TRIG, CHOLHDL, LDLDIRECT in the last 72 hours. Thyroid Function Tests: No results for input(s): TSH, T4TOTAL, T3FREE, THYROIDAB in the last 72 hours.  Invalid input(s): FREET3 Anemia Panel: No results for input(s): VITAMINB12, FOLATE, FERRITIN, TIBC, IRON, RETICCTPCT in the last 72 hours.   PHYSICAL EXAM General: Well developed, well nourished, in no acute distress HEENT:  Normocephalic and atramatic Neck:  No JVD.  Lungs: Clear bilaterally to auscultation and percussion. Heart: HRRR . Normal S1 and S2  without gallops or murmurs.  Abdomen: Bowel sounds are positive, colostomy present    ASSESSMENT AND PLAN: Pt with stable cardiomyopathy. [place] for d/c from cards stand point. Pt given f/u in office 11/14 at 10:30am.    Patient and plan discussed with supervising provider, Dr. Neoma Laming, who agrees with above findings.   Kelby Fam [place], [place]  09/24/2015 9:04 AM

## 2015-09-26 NOTE — Clinical Social Work Placement (Signed)
   CLINICAL SOCIAL WORK PLACEMENT  NOTE  Date:  09/25/2015  Patient Details  Name: Craig Page MRN: [id number] Date of Birth: 1946-01-28  Clinical Social Work is seeking post-discharge placement for this patient at the [place] level of care (*CSW will initial, date and re-position this form in  chart as items are completed):  Yes   Patient/family provided with [facility] Work Department's list of facilities offering this level of care within the geographic area requested by the patient (or if unable, by the patient's family).  Yes   Patient/family informed of their freedom to choose among providers that offer the needed level of care, that participate in Medicare, Medicaid or managed care program needed by the patient, have an available bed and are willing to accept the patient.  Yes   Patient/family informed of [facility]'s ownership interest in [facility] and [facility], as well as of the fact that they are under no obligation to receive care at these facilities.  PASRR submitted to EDS on 09/13/15     PASRR number received on 09/13/15     Existing PASRR number confirmed on       FL2 transmitted to all facilities in geographic area requested by pt/family on 09/13/15     FL2 transmitted to all facilities within larger geographic area on       Patient informed that his/her managed care company has contracts with or will negotiate with certain facilities, including the following:        Yes   Patient/family informed of bed offers received.  Patient chooses bed at [facility]     Physician recommends and patient chooses bed at      Patient to be transferred to [facility] on 09/25/15.  Patient to be transferred to facility by EMS     Patient family notified on 09/25/15 of transfer.  Name of family member notified:  [name], [name] at bedside     PHYSICIAN       Additional Comment:     _______________________________________________ Alonna Buckler, LCSW 09/26/2015, 11:39 PM

## 2015-09-26 NOTE — Progress Notes (Signed)
Late entry 09/26/15 for 09/24/2015 CSW was notified that Pt has been medically cleared for dc to [facility] SN[facility]. Pt's daughter in law is at bedside. Pt's wife is working but is planning to meet Pt at [facility]. Pt doing well. In agreement with plan. RN to call report and EMS for transport.   CSW received authorization for SNF from Amy at [facility].    Toma Copier, [place]

## 2015-10-01 ENCOUNTER — Other Ambulatory Visit: Payer: Self-pay | Admitting: Oncology

## 2015-10-01 ENCOUNTER — Telehealth: Payer: Self-pay | Admitting: Family Medicine

## 2015-10-01 NOTE — Telephone Encounter (Signed)
R/T call to [facility] and explained that patient should not have been seen w/o auth. She says Josem Kaufmann was done under cardiac rehab and not cardiology. 08/19/15 ov was denied. Please advise.

## 2015-10-01 NOTE — Telephone Encounter (Signed)
Craig Page at [facility] Page needs a referral for pt's Sept 12th visit to see Dr. Ola Spurr for chest pain.  Insurance paid for previous visits but denied payment for the visit on that date.  Her call back number is [phone]

## 2015-10-01 NOTE — Telephone Encounter (Signed)
Message given to manager to follow.[place]

## 2015-10-02 ENCOUNTER — Inpatient Hospital Stay
Admission: EM | Admit: 2015-10-02 | Discharge: 2015-10-08 | DRG: 871 | Disposition: E | Payer: Commercial Managed Care - HMO | Attending: Internal Medicine | Admitting: Internal Medicine

## 2015-10-02 ENCOUNTER — Inpatient Hospital Stay: Payer: Commercial Managed Care - HMO

## 2015-10-02 ENCOUNTER — Encounter: Payer: Self-pay | Admitting: Intensive Care

## 2015-10-02 ENCOUNTER — Inpatient Hospital Stay: Payer: Commercial Managed Care - HMO | Attending: Oncology

## 2015-10-02 ENCOUNTER — Inpatient Hospital Stay: Payer: Commercial Managed Care - HMO | Admitting: Oncology

## 2015-10-02 ENCOUNTER — Emergency Department: Payer: Commercial Managed Care - HMO

## 2015-10-02 DIAGNOSIS — I5022 Chronic systolic (congestive) heart failure: Secondary | ICD-10-CM | POA: Diagnosis present

## 2015-10-02 DIAGNOSIS — C787 Secondary malignant neoplasm of liver and intrahepatic bile duct: Secondary | ICD-10-CM | POA: Diagnosis present

## 2015-10-02 DIAGNOSIS — R944 Abnormal results of kidney function studies: Secondary | ICD-10-CM

## 2015-10-02 DIAGNOSIS — I252 Old myocardial infarction: Secondary | ICD-10-CM | POA: Diagnosis not present

## 2015-10-02 DIAGNOSIS — A419 Sepsis, unspecified organism: Principal | ICD-10-CM | POA: Diagnosis present

## 2015-10-02 DIAGNOSIS — C189 Malignant neoplasm of colon, unspecified: Secondary | ICD-10-CM | POA: Diagnosis present

## 2015-10-02 DIAGNOSIS — E43 Unspecified severe protein-calorie malnutrition: Secondary | ICD-10-CM | POA: Diagnosis present

## 2015-10-02 DIAGNOSIS — Z8572 Personal history of non-Hodgkin lymphomas: Secondary | ICD-10-CM | POA: Diagnosis not present

## 2015-10-02 DIAGNOSIS — Z8249 Family history of ischemic heart disease and other diseases of the circulatory system: Secondary | ICD-10-CM

## 2015-10-02 DIAGNOSIS — I110 Hypertensive heart disease with heart failure: Secondary | ICD-10-CM | POA: Diagnosis present

## 2015-10-02 DIAGNOSIS — I959 Hypotension, unspecified: Secondary | ICD-10-CM | POA: Insufficient documentation

## 2015-10-02 DIAGNOSIS — D509 Iron deficiency anemia, unspecified: Secondary | ICD-10-CM | POA: Diagnosis present

## 2015-10-02 DIAGNOSIS — C801 Malignant (primary) neoplasm, unspecified: Secondary | ICD-10-CM

## 2015-10-02 DIAGNOSIS — Z951 Presence of aortocoronary bypass graft: Secondary | ICD-10-CM | POA: Diagnosis not present

## 2015-10-02 DIAGNOSIS — C799 Secondary malignant neoplasm of unspecified site: Secondary | ICD-10-CM | POA: Diagnosis present

## 2015-10-02 DIAGNOSIS — R6521 Severe sepsis with septic shock: Secondary | ICD-10-CM | POA: Diagnosis present

## 2015-10-02 DIAGNOSIS — Z515 Encounter for palliative care: Secondary | ICD-10-CM

## 2015-10-02 DIAGNOSIS — Z933 Colostomy status: Secondary | ICD-10-CM | POA: Diagnosis not present

## 2015-10-02 DIAGNOSIS — K631 Perforation of intestine (nontraumatic): Secondary | ICD-10-CM

## 2015-10-02 DIAGNOSIS — I255 Ischemic cardiomyopathy: Secondary | ICD-10-CM | POA: Diagnosis present

## 2015-10-02 DIAGNOSIS — K922 Gastrointestinal hemorrhage, unspecified: Secondary | ICD-10-CM | POA: Diagnosis present

## 2015-10-02 DIAGNOSIS — I2510 Atherosclerotic heart disease of native coronary artery without angina pectoris: Secondary | ICD-10-CM | POA: Diagnosis present

## 2015-10-02 DIAGNOSIS — C786 Secondary malignant neoplasm of retroperitoneum and peritoneum: Secondary | ICD-10-CM | POA: Diagnosis present

## 2015-10-02 DIAGNOSIS — Z66 Do not resuscitate: Secondary | ICD-10-CM | POA: Diagnosis present

## 2015-10-02 DIAGNOSIS — D72829 Elevated white blood cell count, unspecified: Secondary | ICD-10-CM

## 2015-10-02 DIAGNOSIS — E860 Dehydration: Secondary | ICD-10-CM | POA: Insufficient documentation

## 2015-10-02 DIAGNOSIS — M7989 Other specified soft tissue disorders: Secondary | ICD-10-CM

## 2015-10-02 DIAGNOSIS — R188 Other ascites: Secondary | ICD-10-CM | POA: Diagnosis present

## 2015-10-02 DIAGNOSIS — Z87891 Personal history of nicotine dependence: Secondary | ICD-10-CM | POA: Diagnosis not present

## 2015-10-02 DIAGNOSIS — R627 Adult failure to thrive: Secondary | ICD-10-CM | POA: Diagnosis present

## 2015-10-02 DIAGNOSIS — N179 Acute kidney failure, unspecified: Secondary | ICD-10-CM | POA: Diagnosis present

## 2015-10-02 DIAGNOSIS — E785 Hyperlipidemia, unspecified: Secondary | ICD-10-CM | POA: Diagnosis present

## 2015-10-02 DIAGNOSIS — Z6826 Body mass index (BMI) 26.0-26.9, adult: Secondary | ICD-10-CM

## 2015-10-02 DIAGNOSIS — R531 Weakness: Secondary | ICD-10-CM

## 2015-10-02 LAB — URINALYSIS COMPLETE WITH MICROSCOPIC (ARMC ONLY)
Bilirubin Urine: NEGATIVE
GLUCOSE, UA: NEGATIVE mg/dL
Hgb urine dipstick: NEGATIVE
Ketones, ur: NEGATIVE mg/dL
LEUKOCYTES UA: NEGATIVE
Nitrite: NEGATIVE
PROTEIN: NEGATIVE mg/dL
Specific Gravity, Urine: 1.014 (ref 1.005–1.030)
pH: 5 (ref 5.0–8.0)

## 2015-10-02 LAB — CBC WITH DIFFERENTIAL/PLATELET
BASOS PCT: 0 %
Basophils Absolute: 0 10*3/uL (ref 0–0.1)
Eosinophils Absolute: 1.3 10*3/uL — ABNORMAL HIGH (ref 0–0.7)
Eosinophils Relative: 4 %
HEMATOCRIT: 33.5 % — AB (ref 40.0–52.0)
Hemoglobin: 11 g/dL — ABNORMAL LOW (ref 13.0–18.0)
LYMPHS ABS: 1.7 10*3/uL (ref 1.0–3.6)
LYMPHS PCT: 5 %
MCH: 29.8 pg (ref 26.0–34.0)
MCHC: 32.7 g/dL (ref 32.0–36.0)
MCV: 91.1 fL (ref 80.0–100.0)
MONO ABS: 1.8 10*3/uL — AB (ref 0.2–1.0)
MONOS PCT: 6 %
NEUTROS ABS: 26.5 10*3/uL — AB (ref 1.4–6.5)
Neutrophils Relative %: 85 %
PLATELETS: 134 10*3/uL — AB (ref 150–440)
RBC: 3.68 MIL/uL — ABNORMAL LOW (ref 4.40–5.90)
RDW: 26.8 % — AB (ref 11.5–14.5)
WBC: 31.4 10*3/uL — ABNORMAL HIGH (ref 3.8–10.6)

## 2015-10-02 LAB — PROTIME-INR
INR: 2.88
PROTHROMBIN TIME: 30.2 s — AB (ref 11.4–15.0)

## 2015-10-02 LAB — COMPREHENSIVE METABOLIC PANEL
ALT: 11 U/L — AB (ref 17–63)
AST: 12 U/L — AB (ref 15–41)
Albumin: 2 g/dL — ABNORMAL LOW (ref 3.5–5.0)
Alkaline Phosphatase: 133 U/L — ABNORMAL HIGH (ref 38–126)
Anion gap: 13 (ref 5–15)
BUN: 83 mg/dL — AB (ref 6–20)
CHLORIDE: 110 mmol/L (ref 101–111)
CO2: 18 mmol/L — ABNORMAL LOW (ref 22–32)
CREATININE: 2.98 mg/dL — AB (ref 0.61–1.24)
Calcium: 7.7 mg/dL — ABNORMAL LOW (ref 8.9–10.3)
GFR calc Af Amer: 23 mL/min — ABNORMAL LOW (ref >60)
GFR, EST NON AFRICAN AMERICAN: 20 mL/min — AB (ref >60)
Glucose, Bld: 99 mg/dL (ref 65–99)
Potassium: 4.2 mmol/L (ref 3.5–5.1)
Sodium: 141 mmol/L (ref 135–145)
Total Bilirubin: 2.1 mg/dL — ABNORMAL HIGH (ref 0.3–1.2)
Total Protein: 4.8 g/dL — ABNORMAL LOW (ref 6.5–8.1)

## 2015-10-02 LAB — LACTIC ACID, PLASMA
LACTIC ACID, VENOUS: 1.8 mmol/L (ref 0.5–2.0)
LACTIC ACID, VENOUS: 2.1 mmol/L — AB (ref 0.5–2.0)

## 2015-10-02 LAB — LIPASE, BLOOD: LIPASE: 14 U/L (ref 11–51)

## 2015-10-02 LAB — MRSA PCR SCREENING: MRSA BY PCR: NEGATIVE

## 2015-10-02 LAB — APTT: APTT: 48 s — AB (ref 24–36)

## 2015-10-02 LAB — TROPONIN I: Troponin I: <0.03 ng/mL (ref <0.031)

## 2015-10-02 MED ORDER — ATROPINE SULFATE 1 % OP SOLN
2.0000 [drp] | OPHTHALMIC | Status: DC | PRN
  Administered 2015-10-03: 18:00:00 2 [drp] via SUBLINGUAL
  Filled 2015-10-02: qty 2

## 2015-10-02 MED ORDER — ACETAMINOPHEN 650 MG RE SUPP: 650.0000 mg | Freq: Four times a day (QID) | RECTAL | Status: DC | PRN

## 2015-10-02 MED ORDER — SODIUM CHLORIDE 0.9 % IV BOLUS (SEPSIS)
250.0000 mL | Freq: Once | INTRAVENOUS | Status: AC
  Administered 2015-10-02: 250 mL via INTRAVENOUS

## 2015-10-02 MED ORDER — MORPHINE SULFATE (CONCENTRATE) 10 MG/0.5ML PO SOLN
10.0000 mg | ORAL | Status: DC | PRN
  Administered 2015-10-02: 22:00:00 10 mg via ORAL
  Filled 2015-10-02 (×2): qty 1

## 2015-10-02 MED ORDER — BISACODYL 10 MG RE SUPP: 10.0000 mg | Freq: Every day | RECTAL | Status: DC | PRN

## 2015-10-02 MED ORDER — SODIUM CHLORIDE 0.9 % IV BOLUS (SEPSIS)
1000.0000 mL | Freq: Once | INTRAVENOUS | Status: AC
  Administered 2015-10-02: 1000 mL via INTRAVENOUS

## 2015-10-02 MED ORDER — LORAZEPAM 2 MG/ML IJ SOLN
1.0000 mg | INTRAMUSCULAR | Status: DC | PRN
  Administered 2015-10-02: 1 mg via INTRAVENOUS
  Filled 2015-10-02: qty 1

## 2015-10-02 MED ORDER — ONDANSETRON HCL 4 MG/2ML IJ SOLN: 4.0000 mg | Freq: Four times a day (QID) | INTRAMUSCULAR | Status: DC | PRN

## 2015-10-02 MED ORDER — ONDANSETRON HCL 4 MG PO TABS: 4.0000 mg | ORAL_TABLET | Freq: Four times a day (QID) | ORAL | Status: DC | PRN

## 2015-10-02 MED ORDER — LORAZEPAM 2 MG/ML PO CONC
0.5000 mg | ORAL | Status: DC | PRN
Start: 2015-10-02 — End: 2015-10-04

## 2015-10-02 MED ORDER — SODIUM CHLORIDE 0.9 % IV BOLUS (SEPSIS)
500.0000 mL | Freq: Once | INTRAVENOUS | Status: AC
  Administered 2015-10-02: 500 mL via INTRAVENOUS

## 2015-10-02 MED ORDER — SODIUM CHLORIDE 0.9 % IV SOLN
INTRAVENOUS | Status: DC
  Administered 2015-10-02: 13:00:00 via INTRAVENOUS

## 2015-10-02 MED ORDER — LORAZEPAM 2 MG/ML IJ SOLN
1.0000 mg | INTRAMUSCULAR | Status: DC | PRN
  Administered 2015-10-02 – 2015-10-03 (×3): 1 mg via INTRAVENOUS
  Filled 2015-10-02 (×3): qty 1

## 2015-10-02 MED ORDER — MORPHINE SULFATE (CONCENTRATE) 10 MG/0.5ML PO SOLN
5.0000 mg | ORAL | Status: DC | PRN
  Administered 2015-10-03: 5 mg via ORAL
  Filled 2015-10-02: qty 1

## 2015-10-02 MED ORDER — MORPHINE SULFATE (PF) 2 MG/ML IV SOLN
2.0000 mg | INTRAVENOUS | Status: DC | PRN
  Administered 2015-10-03: 13:00:00 2 mg via INTRAVENOUS
  Filled 2015-10-02: qty 1

## 2015-10-02 MED ORDER — ACETAMINOPHEN 325 MG PO TABS: 650.0000 mg | ORAL_TABLET | Freq: Four times a day (QID) | ORAL | Status: DC | PRN

## 2015-10-02 MED ORDER — LORAZEPAM 2 MG/ML IJ SOLN: 1.0000 mg | INTRAMUSCULAR | Status: DC | PRN

## 2015-10-02 MED ORDER — SODIUM CHLORIDE 0.9 % IV BOLUS (SEPSIS)
1000.0000 mL | Freq: Once | INTRAVENOUS | Status: AC
Start: 2015-10-02 — End: 2015-10-02
  Administered 2015-10-02: 1000 mL via INTRAVENOUS

## 2015-10-02 MED ORDER — MORPHINE SULFATE (PF) 2 MG/ML IV SOLN
2.0000 mg | INTRAVENOUS | Status: DC | PRN
  Administered 2015-10-02: 13:00:00 2 mg via INTRAVENOUS
  Filled 2015-10-02: qty 1

## 2015-10-02 NOTE — Consult Note (Signed)
Palliative Medicine Inpatient Consult Note   Name: Craig Page Date: 09/14/2015 MRN: [id number]  DOB: 1946/11/30  Referring Physician: Nicholes Mango, MD  Palliative Care consult requested for this 69 y.o. male for goals of medical therapy in patient with terminal colon cancer and hypotension.  He has additional problems as outlined below under Impression.    He received 4 -5 liters of fluid in the ER for a SBP in the 60's.  ED physician spoke with Dr Oliva Bustard who conveyed that there was little to offer pt in terms of treatment.  Pt was considered as being appropriate for Hospice Home, but his blood pressure was too low for this transfer, so pt was admitted here for comfort care management.  Wife is here (and is HCPOA0 and other family members are on the way. Dr. Oliva Bustard had mentioned hydrating pt and that is what is being done for now.    IMPRESSION: 1. Stage IV Colon Cancer ---with recent diagnosis and surgery (diverting colostomy) ---pt had perforated bowel at presentation Sep 09, 2015.  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 2.  Severe Malnutrition due to cancer 3. 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  Syncope resulting in today's admission 5.  Weakness --profound for a week.  6.  CAD with h/o MR 11/15 --s/p CABG 11/15  7.  Chronic Systolic CHF  8.  H/O Afib  7.  Essential HTN 8.  Dyslipidemia   TODAY'S DISCUSSIONS AND DECISIONS: Pt is terminal.  He was too unstable for transfer to [place] earlier. Vital Signs are requested now, to determine if he could go to [facility] now. Family is advised that this is an option --possibly today (but  also not likely since it is late in the day).  They are Ok with this. Pt's wife is Media planner and she is in agreement with Hospice Home if we think he is stable for transport.  I mentioned that we could also keep him here and do comfort care here and see how he is doing in the morning and reassess possible Hospice Home transfer at that time.  I have alerted Hospice Liaison and we are both waiting on vital signs being redone at this moment.   Await vital signs update.      REVIEW OF SYSTEMS:  Patient is not able to provide ROS due to severe illness.  SPIRITUAL SUPPORT SYSTEM: Yes --family.  Also pastor arrived while I was with family.   SOCIAL HISTORY:  reports that he quit smoking about a year ago. His smoking use included Cigarettes. He has a 50 pack-year smoking history. He has never used smokeless tobacco. He reports that he does not drink alcohol or use illicit drugs. He has been at [facility] since Oct 18th.  Wife is Media planner.  He has lots of family present. All who needed to get here have come and gone or else are still here.    LEGAL DOCUMENTS:  Portable DNR form is in paper chart (was completed by ED physician earlier today).  CODE STATUS: DNR  PAST MEDICAL HISTORY: Past Medical History  Diagnosis Date  .  CAD (coronary artery disease)     3v  . Ischemic cardiomyopathy   . Chronic systolic heart failure ([place])   . HTN (hypertension)   . HLD (hyperlipidemia)   . Follicular lymphoma ([place])   . Myocardial infarction [facility]) 1992    treated with thrombolytics/notes 10/16/2014    PAST SURGICAL HISTORY:  Past Surgical History  Procedure Laterality Date  . Hernia repair    . Umbilical hernia repair  1964  . Coronary artery bypass graft N/A 10/19/2014    Procedure: CORONARY ARTERY BYPASS GRAFTING (CABG);  Surgeon: Ivin Poot, MD;  Location: [place];  Service: Open Heart Surgery;  Laterality: N/A;  Times 3 using left internal mammary artery and endoscopically harvested  left saphenous vein  . Intraoperative transesophageal echocardiogram N/A 10/19/2014    Procedure: INTRAOPERATIVE TRANSESOPHAGEAL ECHOCARDIOGRAM;  Surgeon: Ivin Poot, MD;  Location: [place];  Service: Open Heart Surgery;  Laterality: N/A;  . Laparoscopy N/A 09/10/2015    Procedure: LAPAROSCOPY DIAGNOSTIC;  Surgeon: Christene Lye, MD;  Location: [facility];  Service: General;  Laterality: N/A;  . Laparotomy N/A 09/10/2015    Procedure: EXPLORATORY LAPAROTOMY;  Surgeon: Christene Lye, MD;  Location: [facility];  Service: General;  Laterality: N/A;  . Transverse loop colostomy Right 09/10/2015    Procedure: TRANSVERSE LOOP COLOSTOMY;  Surgeon: Christene Lye, MD;  Location: [facility];  Service: General;  Laterality: Right;  . Portacath placement N/A 09/20/2015    Procedure: INSERTION PORT-A-CATH;  Surgeon: Christene Lye, MD;  Location: [facility];  Service: General;  Laterality: N/A;  Right     ALLERGIES:  has No Known Allergies.  MEDICATIONS:  Current Facility-Administered Medications  Medication Dose Route Frequency Provider Last Rate Last Dose  . 0.9 %  sodium chloride infusion   Intravenous Continuous Nicholes Mango, MD 125 mL/hr at 09/18/2015 1324    . acetaminophen (TYLENOL) tablet 650 mg  650 mg Oral Q6H PRN Nicholes Mango, MD       Or  . acetaminophen (TYLENOL) suppository 650 mg  650 mg Rectal Q6H PRN Nicholes Mango, MD      . LORazepam (ATIVAN) injection 1 mg  1 mg Intravenous Q4H PRN Nicholes Mango, MD   1 mg at 10/04/2015 1520  . morphine 2 MG/ML injection 2 mg  2 mg Intravenous Q2H PRN Nicholes Mango, MD   2 mg at 09/20/2015 1328  . ondansetron (ZOFRAN) tablet 4 mg  4 mg Oral Q6H PRN Nicholes Mango, MD       Or  . ondansetron (ZOFRAN) injection 4 mg  4 mg Intravenous Q6H PRN Nicholes Mango, MD        Vital Signs: BP 81/51 mmHg  Pulse 83  Temp(Src) 97.5 F (36.4 C) (Oral)  Resp 18  Ht 5\' 10"  (1.778 m)  Wt 83.122 kg (183 lb 4 oz)  BMI 26.29 kg/m2  SpO2 91% Filed Weights    09/25/2015 1318  Weight: 83.122 kg (183 lb 4 oz)    Estimated body mass index is 26.29 kg/(m^2) as calculated from the following:   Height as of this encounter: 5\' 10"  (1.778 m).   Weight as of this encounter: 83.122 kg (183 lb 4 oz).  PERFORMANCE STATUS (ECOG) : 4 - Bedbound  PHYSICAL EXAM: Sleeping --I did not waken him at family request since he has just fallen asleep  He had been alert and able to talk per nursing and family No distress currently, but family says he does have pain  EOMI Mouth dry Neck w/o JVD Hrt rrr no mgr Lungs decreased BS bases Abd soft and NT Skin no mottling currently   LABS: CBC:    Component Value Date/Time   WBC 31.4* 10/04/2015 0810   WBC 10.4 10/16/2014 0458   HGB 11.0* 09/22/2015 0810   HGB 12.1* 10/16/2014 0458   HCT 33.5* 09/15/2015 0810   HCT 36.4* 10/16/2014 0458   PLT 134* 09/20/2015 0810   PLT 186 10/16/2014 0458   MCV 91.1 10/05/2015 0810   MCV 85 10/16/2014 0458   NEUTROABS 26.5* 09/15/2015 0810   NEUTROABS 6.3 10/16/2014 0458   LYMPHSABS 1.7 09/14/2015 0810   LYMPHSABS 2.7 10/16/2014 0458   MONOABS 1.8* 09/23/2015 0810   MONOABS 1.1* 10/16/2014 0458   EOSABS 1.3* 10/04/2015 0810   EOSABS 0.4 10/16/2014 0458   BASOSABS 0.0 09/14/2015 0810   BASOSABS 0.1 10/16/2014 0458   Comprehensive Metabolic Panel:    Component Value Date/Time   NA 141 09/08/2015 0810   NA 139 10/16/2014 0458   K 4.2 09/09/2015 0810   K 4.4 10/16/2014 0458   CL 110 09/10/2015 0810   CL 105 10/16/2014 0458   CO2 18* 09/20/2015 0810   CO2 26 10/16/2014 0458   BUN 83* 09/13/2015 0810   BUN 22* 10/16/2014 0458   CREATININE 2.98* 09/12/2015 0810   CREATININE 1.32* 10/16/2014 0458   GLUCOSE 99 09/29/2015 0810   GLUCOSE 87 10/16/2014 0458   CALCIUM 7.7* 09/15/2015 0810   CALCIUM 8.4* 10/16/2014 0458   AST 12* 09/17/2015 0810   AST 8* 10/15/2014 1210   ALT 11* 10/06/2015 0810   ALT 19 10/15/2014 1210   ALKPHOS 133* 09/16/2015 0810   ALKPHOS 88  10/15/2014 1210   BILITOT 2.1* 09/20/2015 0810   BILITOT 0.4 10/15/2014 1210   PROT 4.8* 09/17/2015 0810   PROT 6.4 10/15/2014 1210   ALBUMIN 2.0* 10/05/2015 0810   ALBUMIN 3.3* 10/15/2014 1210     More than 50% of the visit was spent in counseling/coordination of care: Yes  Time Spent: 80 minutes

## 2015-10-02 NOTE — Care Management Note (Signed)
Case Management Note  Patient Details  Name: Craig Page MRN: [id number] Date of Birth: Aug 27, 1946  Subjective/Objective:     Patient has dropped BP again, so MD will try to admit to comfort care  Here in [facility].               Action/Plan:   Expected Discharge Date:                  Expected Discharge Plan:     In-House Referral:     Discharge planning Services     Post Acute Care Choice:    Choice offered to:     DME Arranged:    DME Agency:     HH Arranged:    [place] Agency:     Status of Service:     Medicare Important Message Given:    Date Medicare IM Given:    Medicare IM give by:    Date Additional Medicare IM Given:    Additional Medicare Important Message give by:     If discussed at [place] of Stay Meetings, dates discussed:    Additional Comments:  Beau Fanny, RN 10/02/2015, 11:05 AM

## 2015-10-02 NOTE — Discharge Instructions (Signed)
Please except by condolences for this difficult time. If at anytime you wishto come to the emergency room we are happy to take care of you . I do agree with your decision for hospice care, and is my hope that you can continue surrounded by loved ones and your family in great comfort.

## 2015-10-02 NOTE — H&P (Addendum)
[place] [facility] [place] NAME: Craig Page    MR#:  [id number]  DATE OF BIRTH:  1946/04/16  DATE OF ADMISSION:  10/04/2015  PRIMARY CARE PHYSICIAN: [name], MD   REQUESTING/REFERRING PHYSICIAN: Dr.[name]  CHIEF COMPLAINT:   Low blood pressure HISTORY OF PRESENT ILLNESS:  Craig Page  is a 69 y.o. male with a known history of lymphoma, stage IV colon cancer which is very advanced, not a candidate for chemotherapy or radiation with a recent history of a perforation and colectomy with colostomy is brought into the ED from [facility] as he became unresponsive in  the wheelchair.His colon cancer is quite recently diagnosed . The family is reporting that patient has not been eating or drinking for the last week and this morning he passed out. Patient states he just does not feel like eating as his abdominal pain is getting worse with eating.. The patient has had ascites drained from his abdomen before Patient is very hypotensive in the ED and has received 4-5 L fluid boluses. ED physician has discussed with his oncologist Dr. Donnetta Hail who has nothing much to offer as his cancer is quite advanced. At this point family is agreeable to change his CODE STATUS to DO NOT RESUSCITATE and comfort care. Patient was evaluated by hospice care nurse [name] in the ED but unfortunately we cannot transfer the patient to hospice home at this point as he is hemodynamically unstable. Patient's wife who is the healthcare power of attorney and other family members have requested to continue IV fluids until other significant family members come and visit him  PAST MEDICAL HISTORY:   Past Medical History  Diagnosis Date  . CAD (coronary artery disease)     3v  . Ischemic cardiomyopathy   . Chronic systolic heart failure ([place])   . HTN (hypertension)   . HLD (hyperlipidemia)   . Follicular lymphoma ([place])   . Myocardial infarction [facility]) 1992    treated  with thrombolytics/notes 10/16/2014    PAST SURGICAL HISTOIRY:   Past Surgical History  Procedure Laterality Date  . Hernia repair    . Umbilical hernia repair  1964  . Coronary artery bypass graft N/A 10/19/2014    Procedure: CORONARY ARTERY BYPASS GRAFTING (CABG);  Surgeon: Ivin Poot, MD;  Location: [place];  Service: Open Heart Surgery;  Laterality: N/A;  Times 3 using left internal mammary artery and endoscopically harvested left saphenous vein  . Intraoperative transesophageal echocardiogram N/A 10/19/2014    Procedure: INTRAOPERATIVE TRANSESOPHAGEAL ECHOCARDIOGRAM;  Surgeon: Ivin Poot, MD;  Location: [place];  Service: Open Heart Surgery;  Laterality: N/A;  . Laparoscopy N/A 09/10/2015    Procedure: LAPAROSCOPY DIAGNOSTIC;  Surgeon: Christene Lye, MD;  Location: [facility];  Service: General;  Laterality: N/A;  . Laparotomy N/A 09/10/2015    Procedure: EXPLORATORY LAPAROTOMY;  Surgeon: Christene Lye, MD;  Location: [facility];  Service: General;  Laterality: N/A;  . Transverse loop colostomy Right 09/10/2015    Procedure: TRANSVERSE LOOP COLOSTOMY;  Surgeon: Christene Lye, MD;  Location: [facility];  Service: General;  Laterality: Right;  . Portacath placement N/A 09/20/2015    Procedure: INSERTION PORT-A-CATH;  Surgeon: Christene Lye, MD;  Location: [facility];  Service: General;  Laterality: N/A;  Right     SOCIAL HISTORY:   Social History  Substance Use Topics  . Smoking status: Former Smoker -- 1.00 packs/day for 50 years  Types: Cigarettes    Quit date: 09/24/2014  . Smokeless tobacco: Never Used  . Alcohol Use: No    FAMILY HISTORY:   Family History  Problem Relation Age of Onset  . Heart failure Mother     deceased  . Cirrhosis Father     deceased    DRUG ALLERGIES:  No Known Allergies  REVIEW OF SYSTEMS:  Limited review of system as the patient is in pain and uncomfortable CONSTITUTIONAL: No fever, reporting fatigue or  weakness.  EYES: No blurred or double vision.  EARS, NOSE, AND THROAT: No tinnitus or ear pain.  RESPIRATORY: No cough, reports shortness of breath with minimal exertion.  CARDIOVASCULAR: No chest pain, orthopnea, edema.  GASTROINTESTINAL: Has diffuse abdominal pain.    MEDICATIONS AT HOME:   Prior to Admission medications   Medication Sig Start Date End Date Taking? Authorizing Provider  amiodarone (PACERONE) 200 MG tablet Take 1 tablet (200 mg total) by mouth daily. 12/03/14  Yes [name], PA-C  carvedilol (COREG) 3.125 MG tablet TAKE 1 TABLET BY MOUTH TWICE DAILY. 05/31/15  Yes [name], MD  lisinopril (PRINIVIL,ZESTRIL) 2.5 MG tablet TAKE 1 TABLET BY MOUTH TWICE DAILY 08/05/15  Yes [name], MD  mirtazapine (REMERON) 15 MG tablet Take 7.5 mg by mouth at bedtime.   Yes Historical Provider, MD  ondansetron (ZOFRAN) 4 MG tablet Take 1-2 tablets (4-8 mg total) by mouth every 8 (eight) hours as needed for nausea (not responsive to prochlorperazine (COMPAZINE)). 09/04/15  Yes [name], NP  oxyCODONE (OXY IR/ROXICODONE) 5 MG immediate release tablet Take 5-10 mg by mouth every 4 (four) hours as needed for moderate pain, severe pain or breakthrough pain.   Yes Historical Provider, MD  pantoprazole (PROTONIX) 40 MG tablet Take 40 mg by mouth daily.   Yes Historical Provider, MD  predniSONE (DELTASONE) 20 MG tablet Take 20 mg by mouth daily.   Yes Historical Provider, MD  simethicone (MYLICON) 80 MG chewable tablet Chew 1 tablet by mouth 3 (three) times daily as needed (acid reflux).    Yes Historical Provider, MD  spironolactone (ALDACTONE) 25 MG tablet Take 0.5 tablets (12.5 mg total) by mouth daily. 10/31/14  Yes [name], NP      VITAL SIGNS:  Blood pressure 99/61, pulse 86, temperature 97.4 F (36.3 C), temperature source Oral, resp. rate 24, SpO2 94 %.  PHYSICAL EXAMINATION:  GENERAL:  69 y.o.-year-old patient lying in the bed with no acute distress.  Jaundiced and cachectic EYES: Pupils equal, round,  reactive to light and accommodation. Positive scleral icterus.   HEENT: Head atraumatic, normocephalic. Oropharynx and nasopharynx clear.  NECK:  Supple, no jugular venous distention.  LUNGS: Moderate air entry with decreased breath sounds at lower lung fields secondary to abdominal distention , no wheezing, rales,rhonchi or crepitation. No use of accessory muscles of respiration.  CARDIOVASCULAR: S1, S2 normal. No murmurs, rubs, or gallops.  ABDOMEN: Soft, tender, distended. Colostomy site is intact, colostomy bag with brown stool. Bowel sounds present. EXTREMITIES: No pedal edema, cyanosis, or clubbing.  NEUROLOGIC: Patient is awake and alert .Sensation intact. Gait not checked.  PSYCHIATRIC: The patient is alert and oriented x 3.  SKIN: No obvious rash, lesion, or ulcer.   LABORATORY PANEL:   CBC  Recent Labs Lab 09/29/2015 0810  WBC 31.4*  HGB 11.0*  HCT 33.5*  PLT 134*   ------------------------------------------------------------------------------------------------------------------  Chemistries   Recent Labs Lab 10/04/2015 0810  NA 141  K  4.2  CL 110  CO2 18*  GLUCOSE 99  BUN 83*  CREATININE 2.98*  CALCIUM 7.7*  AST 12*  ALT 11*  ALKPHOS 133*  BILITOT 2.1*   ------------------------------------------------------------------------------------------------------------------  Cardiac Enzymes  Recent Labs Lab 09/22/2015 0810  TROPONINI <0.03   ------------------------------------------------------------------------------------------------------------------  RADIOLOGY:  Dg Chest Port 1 View  09/20/2015  CLINICAL DATA:  Syncope. EXAM: PORTABLE CHEST 1 VIEW COMPARISON:  September 20, 2015. FINDINGS: Stable cardiomegaly. Status post coronary artery bypass graft. Right subclavian Port-A-Cath is noted with distal tip in the expected position of cavoatrial junction. No pneumothorax is noted. Right lung is clear. Left  basilar opacity is noted concerning for atelectasis with associated pleural effusion. Bony thorax is unremarkable. IMPRESSION: Left basilar opacity concerning for atelectasis with associated pleural effusion. Electronically Signed   By: Marijo Conception, M.D.   On: 10/05/2015 08:15    EKG:   Orders placed or performed during the hospital encounter of 10/01/2015  . ED EKG  . ED EKG    IMPRESSION AND PLAN:   1. Adult failure to thrive secondary to lymphoma and stage IV colon cancer Patient is not a candidate for chemotherapy or radiation for his newly diagnosed advanced cancer per Dr. Donnetta Hail Patient has been  not eating or drinking much for the past one week Consult palliative care, Dr. Megan Salon was notified by hospice care nurse [name] We will implement DO NOT RESUSCITATE with comfort care measures however will continue IV fluids as requested by family until other significant family members come and visit the patient Patient is unsteady to be transferred to hospice home at this time Will provide him IV morphine as needed for pain management Provide oxygen via nasal cannula as needed for shortness of breath to keep him comfortable Will give Tylenol as needed for fever and chills Ativan as needed for anxiety    2. Hypotension Continue IV fluids and will monitor blood pressure as needed basis only until other significant family members come and visit the patient.  3. Syncope secondary to dehydration Patient has been not eating or drinking for the past one week Will provide regular diet as tolerated IV fluids as needed  4. Disposition Family is considering hospice home if patient is hemodynamically stable    All the records are reviewed and case discussed with ED provider. Management plans discussed with the patient, his wife and family and they are in agreement.  CODE STATUS: DO NOT RESUSCITATE with comfort care  TOTAL TIME TAKING CARE OF THIS PATIENT: 50minutes.    Nicholes Mango M.D on 09/07/2015 at 11:59 AM  Between 7am to 6pm - Pager - [phone]  After 6pm go to [email or web address] - password [email or web address] [facility]  Office  [phone]  CC: Primary care physician; [name], MD

## 2015-10-02 NOTE — Care Management Note (Signed)
Case Management Note  Patient Details  Name: KORTEZ MURTAGH MRN: [id number] Date of Birth: 1946-08-23  Subjective/Objective:   Spoke to Flo Shanks, [place]. MD  After speaking to family, have come to an agreement that they would like the patient to return to [facility], and be followed by Hospice care. I made a referral by phone to Santiago Glad to start the process. MD has been asked to put the order for Palliative Care to follow patient at [facility] in [facility]. I have given Santiago Glad the patients demographic info. I will fax the MD note, and face sheet once in the system. The patient will be discharged.                  Action/Plan:   Expected Discharge Date:                  Expected Discharge Plan:     In-House Referral:     Discharge planning Services     Post Acute Care Choice:    Choice offered to:     DME Arranged:    DME Agency:     HH Arranged:    [place] Agency:     Status of Service:     Medicare Important Message Given:    Date Medicare IM Given:    Medicare IM give by:    Date Additional Medicare IM Given:    Additional Medicare Important Message give by:     If discussed at [place] of Stay Meetings, dates discussed:    Additional Comments:  Beau Fanny, RN 09/09/2015, 9:40 AM

## 2015-10-02 NOTE — Care Management Note (Signed)
Case Management Note  Patient Details  Name: [name] MRN: [id number] Date of Birth: Dec 18, 1945  Subjective/Objective:      Faxed pt. Note from MD, order for palliative, and DNR form to [facility] at [phone]. Confirmation of fax received. Also spoke to family at bedside, to confirm they would like the patient to go back to [facility] for palliative follow up. I have explained to them that the patient would be seen by the Hospice RN's and NP. They have no further questions at this time. packet put together and labelled, and nurse for the patient is aware the pt. Is ready to go .             Action/Plan:   Expected Discharge Date:                  Expected Discharge Plan:     In-House Referral:     Discharge planning Services     Post Acute Care Choice:    Choice offered to:     DME Arranged:    DME Agency:     HH Arranged:    [place] Agency:     Status of Service:     Medicare Important Message Given:    Date Medicare IM Given:    Medicare IM give by:    Date Additional Medicare IM Given:    Additional Medicare Important Message give by:     If discussed at [place] of Stay Meetings, dates discussed:    Additional Comments:  Beau Fanny, RN 09/28/2015, 10:02 AM

## 2015-10-02 NOTE — Progress Notes (Addendum)
Palliative Care Update  Pts rechecked Vital signs after fluids showed a systolic of 74.    He is thus still too unstable for transfer to [facility].  I spoke to family. All the family that needed to come and see him has visited and wife says it is OK to stop fluids and go with total comfort care now.   I let familiy know that I had talked with Dr. Oliva Bustard earlier.    Pt does get restless and moan at times and he has gotten IV Ativan for this already.  Will adjust orders slightly for total comfort approach.  He is not expected to survive the night, but should he, I will see him and family in the morning.  Pts wife needs a lot of support as she is quite tearful.  Supportive conversation was part of my visit today (speaking of the dying process, what to expect, and how we give her permission to go eat, take a break, and sleep, etc --and how that gives pt permission to do the work he has to do in the dying process, etc etc).    Time involved today with pt/ family = 80 minutes.    Kirby Funk, MD

## 2015-10-02 NOTE — ED Notes (Signed)
Patient intake at bedside

## 2015-10-02 NOTE — Progress Notes (Addendum)
Notified by [facility] that patient was in of needed Palliative Care consult upon return to [facility]. Patient declined quickly and it was felt that he maybe hospice home appropriate, when writer arrived to the ED, patient's blood pressure was in the 50's/30s and attending ED physician felt that patient was too unstable for transport and decision was made to admit patient to [facility] for comfort care. Flo Shanks RN, BSN, [place] and [facility] [place], [facility] [phone] c

## 2015-10-02 NOTE — Progress Notes (Signed)
   09/14/2015 1400  Clinical Encounter Type  Visited With Patient and family together  Visit Type Spiritual support  Referral From Nurse  Consult/Referral To Chaplain  Spiritual Encounters  Spiritual Needs Emotional  Provided pastoral presence and support to patient and family on unit.  [place] [phone]

## 2015-10-02 NOTE — Progress Notes (Signed)
   09/21/2015 1100  Clinical Encounter Type  Visited With Patient and family together  Visit Type Spiritual support  Referral From Nurse  Consult/Referral To Chaplain  Spiritual Encounters  Spiritual Needs Emotional;Prayer  Stress Factors  Patient Stress Factors Not reviewed  Family Stress Factors Major life changes  Chaplain provided prayer and a compassionate presence to patient and family.  Chaplain Briah Nary: [phone]

## 2015-10-02 NOTE — ED Notes (Signed)
Patient [facility] for [facility] syncope (LOC) episode. Staff reports patient being unresponsive for 10 minutes. Patient was discharged from [facility] a week ago. Patient has been in and out of hospital due to colon cancer. Patient has not received chemo (due to cancer being so bad per daughter in law)

## 2015-10-02 NOTE — Plan of Care (Signed)
Problem: Discharge Progression Outcomes Goal: Other Discharge Outcomes/Goals Outcome: Progressing Plan of care progress to goal: Pt admitted with comfort care orders. IVF infusing until all of patients family have seen patient. C/o pain, morphine given PRN. Pt resting quietly after. Pt agitated and trying to take off clothes, ativan given PRN.  Palliative care MD to see patient and talk with family.
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
## 2015-10-03 LAB — URINE CULTURE: Culture: NO GROWTH

## 2015-10-03 MED ORDER — MORPHINE 100MG IN NS 100ML (1MG/ML) PREMIX INFUSION
6.0000 mg/h | INTRAVENOUS | Status: DC
  Administered 2015-10-03: 14:00:00 6 mg/h via INTRAVENOUS
  Filled 2015-10-03: qty 100

## 2015-10-03 MED ORDER — GLYCOPYRROLATE 0.2 MG/ML IJ SOLN
0.2000 mg | INTRAMUSCULAR | Status: DC
  Filled 2015-10-03 (×5): qty 1

## 2015-10-03 MED ORDER — SODIUM CHLORIDE 0.9 % IV SOLN: 6.0000 mg/h | INTRAVENOUS | Status: DC

## 2015-10-04 DIAGNOSIS — I959 Hypotension, unspecified: Secondary | ICD-10-CM | POA: Insufficient documentation

## 2015-10-07 ENCOUNTER — Other Ambulatory Visit: Payer: Commercial Managed Care - HMO

## 2015-10-07 ENCOUNTER — Ambulatory Visit: Payer: Commercial Managed Care - HMO | Admitting: Oncology

## 2015-10-08 LAB — CULTURE, BLOOD (ROUTINE X 2)
CULTURE: NO GROWTH
Culture: NO GROWTH

## 2015-10-08 NOTE — Progress Notes (Signed)
Nutrition Brief Note   Chart reviewed. Consult to RD discontinued. Pt now transitioning to comfort care.  No further nutrition interventions warranted at this time.  Please re-consult as needed.   Dwyane Luo, [place], LDN Pager [phone]

## 2015-10-08 NOTE — Discharge Summary (Signed)
[name]    Death Note     Death Note please see Last Note for all details.   In breif - this elderly patient with stage IV colon cancer and recent viscus perforation as well as chronic lymphoma presented with altered mental status. He was found to be septic, hypotensive, severely anemic. Family agreed to comfort care. He was monitored closely for signs of distress and treated accordingly for comfort care guidelines. He passed at 8:05 PM on 08-31-2015.    Craig Page FYB:[id number],NID:[id number] is a 69 y.o. male, Outpatient Primary MD for the patient is [name], MD  Pronounced dead by Westley Gambles RN on    2015/08/31  @          8:05 PM         Cause of death hypovolemic shock, sepsis, GI bleeding, stage IV colon cancer   Myrtis Ser M.D on 10/04/2015 at 2:49 PM  [place] [facility] [place] [phone]  Total clinical and documentation time for today Under 30 minutes   Last Note

## 2015-10-08 NOTE — Progress Notes (Signed)
   Oct 23, 2015 2008  Clinical Encounter Type  Visited With Patient and family together  Visit Type Death  Referral From Nurse  Consult/Referral To Chaplain  Spiritual Encounters  Spiritual Needs Prayer;Emotional;Grief support  Stress Factors  Family Stress Factors Loss  Paged. Visited with family at the death of Pt. Provided spiritual/emotional care, prayer/pastoral care. Craig Page

## 2015-10-08 NOTE — Progress Notes (Signed)
[place] at [place] NAME: Craig Page    MR#:  [id number]  DATE OF BIRTH:  02-05-1946  SUBJECTIVE:  CHIEF COMPLAINT:   Chief Complaint  Patient presents with  . Loss of Consciousness   Calm. Opens eyes to stimulation. No distress  REVIEW OF SYSTEMS:   ROS unable to obtain  DRUG ALLERGIES:  No Known Allergies  VITALS:  Blood pressure 72/39, pulse 82, temperature 97.5 F (36.4 C), temperature source Oral, resp. rate 22, height 5\' 10"  (1.778 m), weight 83.122 kg (183 lb 4 oz), SpO2 98 %.  PHYSICAL EXAMINATION:  GENERAL:  69 y.o.-year-old patient lying in the bed with no acute distress. Critically ill appearing LUNGS: Normal breath sounds bilaterally, no wheezing, rales, rhonchi or crepitation. No use of accessory muscles of respiration. Shallow respirations CARDIOVASCULAR: S1, S2 normal. No murmurs, rubs, or gallops.  ABDOMEN: Soft, nontender, nondistended. Bowel sounds present. No organomegaly or mass. Colostomy bag right lower quadrant EXTREMITIES: No pedal edema, cyanosis, or clubbing.   LABORATORY PANEL:   CBC  Recent Labs Lab 10/01/2015 0810  WBC 31.4*  HGB 11.0*  HCT 33.5*  PLT 134*   ------------------------------------------------------------------------------------------------------------------  Chemistries   Recent Labs Lab 09/10/2015 0810  NA 141  K 4.2  CL 110  CO2 18*  GLUCOSE 99  BUN 83*  CREATININE 2.98*  CALCIUM 7.7*  AST 12*  ALT 11*  ALKPHOS 133*  BILITOT 2.1*   ------------------------------------------------------------------------------------------------------------------  Cardiac Enzymes  Recent Labs Lab 09/10/2015 0810  TROPONINI <0.03   ------------------------------------------------------------------------------------------------------------------  RADIOLOGY:  Dg Chest Port 1 View  09/14/2015  CLINICAL DATA:  Syncope. EXAM: PORTABLE CHEST 1 VIEW COMPARISON:  September 20, 2015. FINDINGS: Stable cardiomegaly. Status post coronary artery bypass graft. Right subclavian Port-A-Cath is noted with distal tip in the expected position of cavoatrial junction. No pneumothorax is noted. Right lung is clear. Left basilar opacity is noted concerning for atelectasis with associated pleural effusion. Bony thorax is unremarkable. IMPRESSION: Left basilar opacity concerning for atelectasis with associated pleural effusion. Electronically Signed   By: Marijo Conception, M.D.   On: 10/05/2015 08:15    EKG:   Orders placed or performed during the hospital encounter of 09/19/2015  . ED EKG  . ED EKG    ASSESSMENT AND PLAN:   #1 stage IV colon cancer with recent viscus perforation colectomy and colostomy placement. Lymphoma. Cancer advanced and he is no longer a chemotherapy patient. He has had a fairly rapid decline. Presented yesterday with unresponsive. Meeting septic criteria with leukocytosis of greater than 20,000, hypotension. At this point we have transitioned him to comfort measures. His family is present including his wife, brother, sister-in-law and daughter. All family members are in agreement with this plan. The patient is currently comfortable, no respiratory distress or agitation. Nurses are monitoring frequently for signs of these and treating with morphine versus Ativan. Not stable for transport to hospice home.  CODE STATUS: DO NOT RESUSCITATE  TOTAL TIME TAKING CARE OF THIS PATIENT: 30 minutes.  Greater than 50% of time spent in care coordination and counseling. Care plan discussed with family. Palliative care consultation appreciated   Myrtis Ser M.D on 10-17-15 at 10:59 AM  Between 7am to 6pm - Pager - [phone]  After 6pm go to [email or web address] - password [email or web address] [place] [facility]  Office  [phone]  CC: Primary care physician; [name], MD

## 2015-10-08 NOTE — Plan of Care (Signed)
Problem: Discharge Progression Outcomes Goal: Other Discharge Outcomes/Goals Plan of care progress to goal: - On comfort care. - Roxanol given for pain with improvement. - Ativan given for anxiety with improvement. Family at bedside. Will continue to monitor.

## 2015-10-08 NOTE — Progress Notes (Signed)
Pt beginning agonal respirations at 2000. Family at bedside anticipating pt dying with update done. RN remained at bedside with pt with respirations ceasing at 2005 with no BP, P, R. Second nurse verfication done by [employer]. Paged house supervisor with [place] notified. Paged Dr. Bridgett Larsson with MD notified of expiration. Paged Chaplain. Notified Dru, Camera operator.

## 2015-10-08 NOTE — Progress Notes (Signed)
[facility] contacted with body released to [facility]. Chaplain in for family. Will perform postmortem care.

## 2015-10-08 NOTE — Progress Notes (Signed)
Palliative Medicine Inpatient Consult Follow Up Note   Name: Craig Page Date: 10/04/2015 MRN: [id number]  DOB: 1946-02-17  Referring Physician: No att. providers found  Palliative Care consult requested for this 69 y.o. male for goals of medical therapy in patient with terminal colon cancer with mets and hypotension.  He is under comfort care now.  He has been too unstable for transport to hospice home as had been originally planned.    TODAY'S DISCUSSIONS AND DECISIONS: Pt is approaching time of death.   Comfort med orders are adjusted slightly --with morphine drip ordered at this time.  Death expected soon. Supportive conversations provided to family.     IMPRESSION: 1. Stage IV Colon Cancer ---with recent diagnosis and surgery (diverting colostomy) ---pt had perforated bowel at presentation Sep 09, 2015.  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 2. Severe Malnutrition due to cancer 3. 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 Syncope resulting in today's admission 5. Weakness --profound for a week.  6. CAD with h/o MR 11/15 --s/p CABG 11/15  7. Chronic Systolic CHF  8. H/O Afib  7. Essential HTN   CODE STATUS: DNR   PAST MEDICAL HISTORY: Past Medical History  Diagnosis Date  . CAD (coronary artery disease)     3v  . Ischemic cardiomyopathy   . Chronic systolic heart failure ([place])   . HTN (hypertension)   . HLD (hyperlipidemia)   . Follicular lymphoma ([place])   . Myocardial infarction [facility]) 1992    treated with thrombolytics/notes 10/16/2014    PAST SURGICAL HISTORY:  Past Surgical History  Procedure Laterality  Date  . Hernia repair    . Umbilical hernia repair  1964  . Coronary artery bypass graft N/A 10/19/2014    Procedure: CORONARY ARTERY BYPASS GRAFTING (CABG);  Surgeon: Ivin Poot, MD;  Location: [place];  Service: Open Heart Surgery;  Laterality: N/A;  Times 3 using left internal mammary artery and endoscopically harvested left saphenous vein  . Intraoperative transesophageal echocardiogram N/A 10/19/2014    Procedure: INTRAOPERATIVE TRANSESOPHAGEAL ECHOCARDIOGRAM;  Surgeon: Ivin Poot, MD;  Location: [place];  Service: Open Heart Surgery;  Laterality: N/A;  . Laparoscopy N/A 09/10/2015    Procedure: LAPAROSCOPY DIAGNOSTIC;  Surgeon: Christene Lye, MD;  Location: [facility];  Service: General;  Laterality: N/A;  . Laparotomy N/A 09/10/2015    Procedure: EXPLORATORY LAPAROTOMY;  Surgeon: Christene Lye, MD;  Location: [facility];  Service: General;  Laterality: N/A;  . Transverse loop colostomy Right 09/10/2015    Procedure: TRANSVERSE LOOP COLOSTOMY;  Surgeon: Christene Lye, MD;  Location: [facility];  Service: General;  Laterality: Right;  . Portacath placement N/A 09/20/2015    Procedure: INSERTION PORT-A-CATH;  Surgeon: Christene Lye, MD;  Location: [facility];  Service: General;  Laterality: N/A;  Right     Vital Signs: BP 50/33 mmHg  Pulse 79  Temp(Src) 97.8 F (36.6 C) (Oral)  Resp 22  Ht 5\' 10"  (1.778 m)  Wt 83.122 kg (183 lb 4 oz)  BMI 26.29 kg/m2  SpO2 92% Filed Weights   09/13/2015 1318  Weight: 83.122 kg (  183 lb 4 oz)    Estimated body mass index is 26.29 kg/(m^2) as calculated from the following:   Height as of this encounter: 5\' 10"  (1.778 m).   Weight as of this encounter: 83.122 kg (183 lb 4 oz).  PHYSICAL EXAM: He is having some labored respirations noted --not agonal yet Eyes closed Nares with O2 in place Neck w/o JVD or tM Hrt rrr no m --distant Lungs with labored respirations (use of acces muscles) Ext no mottling but hands are  cool.   LABS: CBC:    Component Value Date/Time   WBC 31.4* 09/12/2015 0810   WBC 10.4 10/16/2014 0458   HGB 11.0* 09/14/2015 0810   HGB 12.1* 10/16/2014 0458   HCT 33.5* 09/17/2015 0810   HCT 36.4* 10/16/2014 0458   PLT 134* 09/26/2015 0810   PLT 186 10/16/2014 0458   MCV 91.1 09/11/2015 0810   MCV 85 10/16/2014 0458   NEUTROABS 26.5* 10/04/2015 0810   NEUTROABS 6.3 10/16/2014 0458   LYMPHSABS 1.7 09/19/2015 0810   LYMPHSABS 2.7 10/16/2014 0458   MONOABS 1.8* 09/23/2015 0810   MONOABS 1.1* 10/16/2014 0458   EOSABS 1.3* 09/16/2015 0810   EOSABS 0.4 10/16/2014 0458   BASOSABS 0.0 09/25/2015 0810   BASOSABS 0.1 10/16/2014 0458   Comprehensive Metabolic Panel:    Component Value Date/Time   NA 141 09/21/2015 0810   NA 139 10/16/2014 0458   K 4.2 09/15/2015 0810   K 4.4 10/16/2014 0458   CL 110 09/09/2015 0810   CL 105 10/16/2014 0458   CO2 18* 09/27/2015 0810   CO2 26 10/16/2014 0458   BUN 83* 10/05/2015 0810   BUN 22* 10/16/2014 0458   CREATININE 2.98* 10/01/2015 0810   CREATININE 1.32* 10/16/2014 0458   GLUCOSE 99 09/24/2015 0810   GLUCOSE 87 10/16/2014 0458   CALCIUM 7.7* 09/22/2015 0810   CALCIUM 8.4* 10/16/2014 0458   AST 12* 09/20/2015 0810   AST 8* 10/15/2014 1210   ALT 11* 09/18/2015 0810   ALT 19 10/15/2014 1210   ALKPHOS 133* 10/05/2015 0810   ALKPHOS 88 10/15/2014 1210   BILITOT 2.1* 09/24/2015 0810   BILITOT 0.4 10/15/2014 1210   PROT 4.8* 09/18/2015 0810   PROT 6.4 10/15/2014 1210   ALBUMIN 2.0* 09/20/2015 0810   ALBUMIN 3.3* 10/15/2014 1210    More than 50% of the visit was spent in counseling/coordination of care: YES  Time Spent:  35 min

## 2015-10-08 NOTE — Plan of Care (Signed)
Problem: Discharge Progression Outcomes Goal: Other Discharge Outcomes/Goals Outcome: Progressing Pt is Comfort Care. Non-responsive.  Morphine drip started today at 6 ml/hr. He's tachypnic but seems to be resting well.  Family is at bedside.  Bereavement cart refreshed.  Pt tolerated bath well. BP and HR have dropped slightly.

## 2015-10-08 DEATH — deceased

## 2015-10-11 LAB — SURGICAL PATHOLOGY

## 2015-10-14 ENCOUNTER — Encounter: Payer: Self-pay | Admitting: Oncology

## 2015-11-15 ENCOUNTER — Ambulatory Visit: Payer: Medicare HMO | Admitting: Vascular Surgery

## 2015-11-15 ENCOUNTER — Other Ambulatory Visit (HOSPITAL_COMMUNITY): Payer: Medicare HMO

## 2015-11-22 ENCOUNTER — Encounter (HOSPITAL_COMMUNITY): Payer: Commercial Managed Care - HMO

## 2015-11-22 ENCOUNTER — Ambulatory Visit: Payer: Self-pay | Admitting: Vascular Surgery

## 2015-12-05 ENCOUNTER — Other Ambulatory Visit: Payer: Self-pay | Admitting: Nurse Practitioner

## 2016-02-26 IMAGING — US US PARACENTESIS
1 series · 9 of 9 positions shown · non-contrast
Comparison: None.

CLINICAL DATA: Ascites.

EXAM:
ULTRASOUND GUIDED PARACENTESIS

[Series 1: us paracentesis · 0.25mm/px · 9 of 9 slices shown]
[im 1/9]
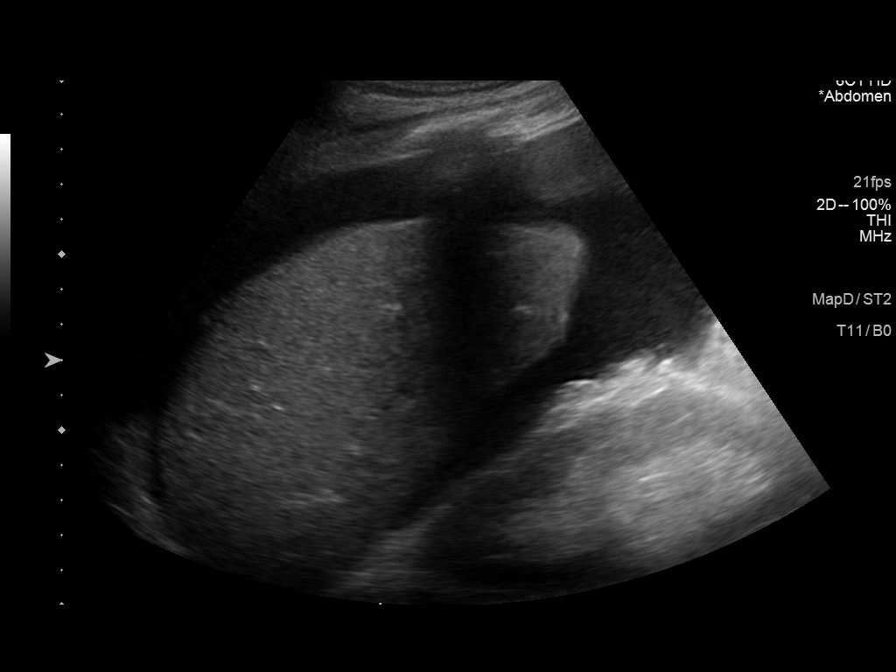
[im 2/9]
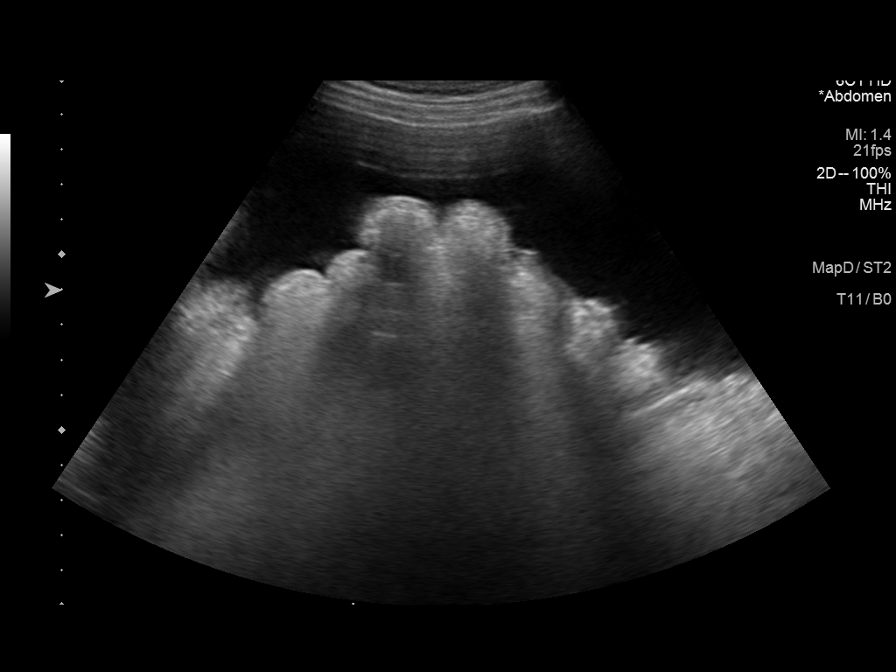
[im 3/9]
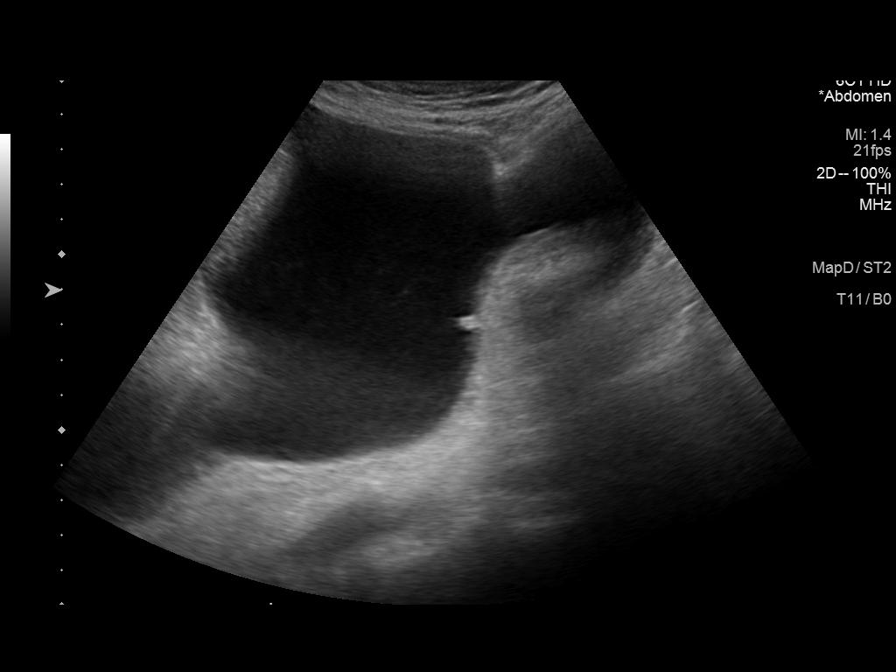
[im 4/9]
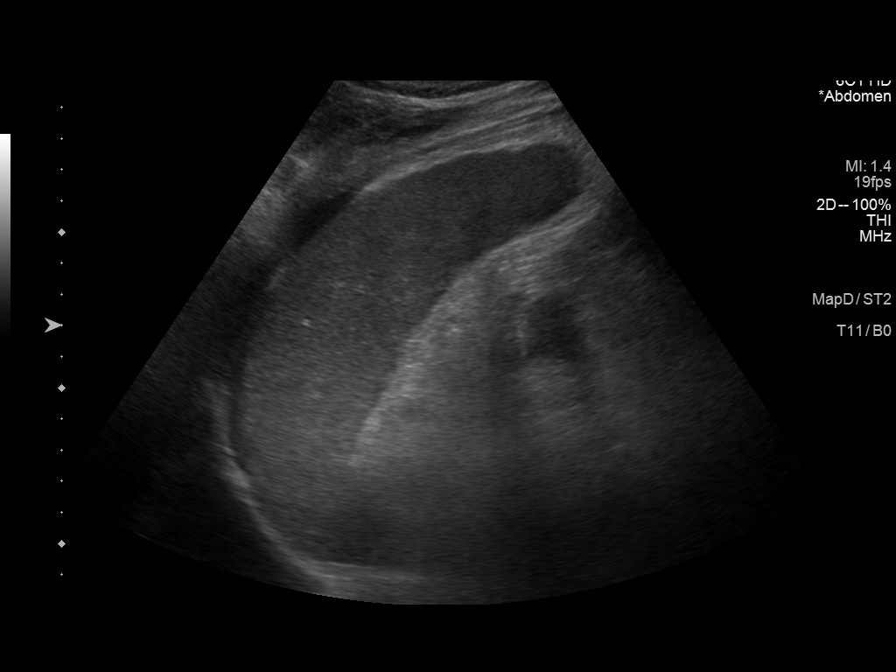
[im 5/9]
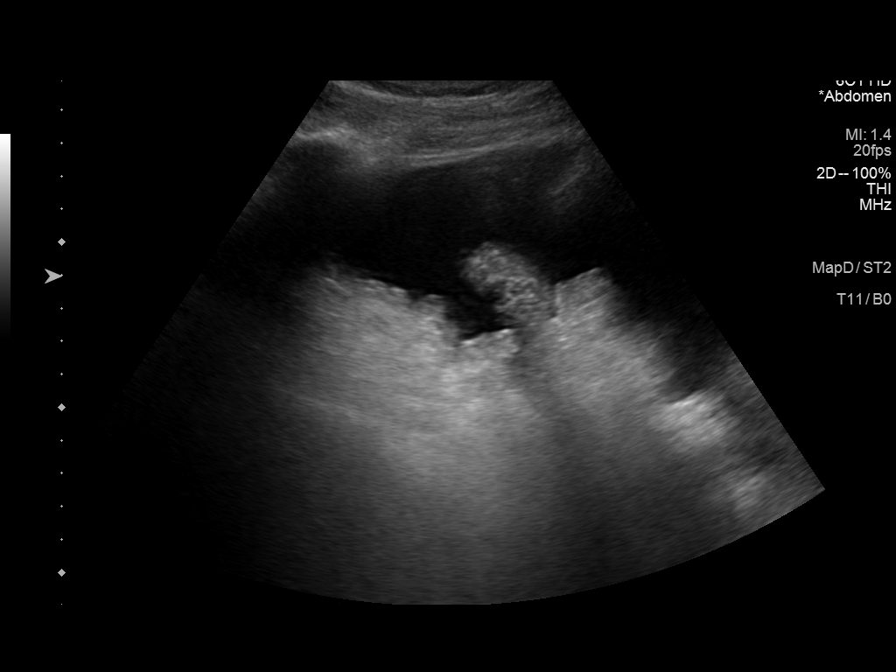
[im 6/9]
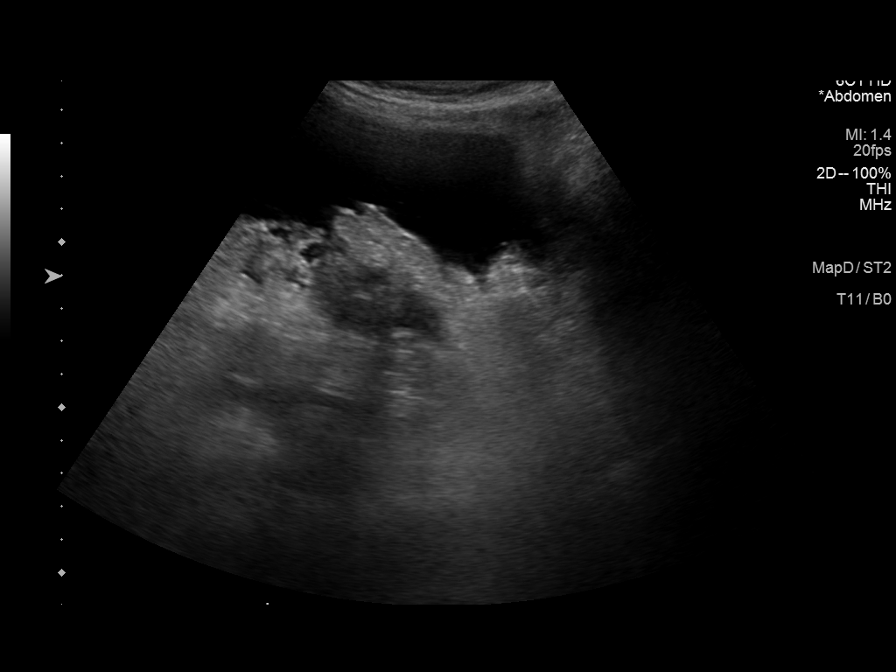
[im 7/9]
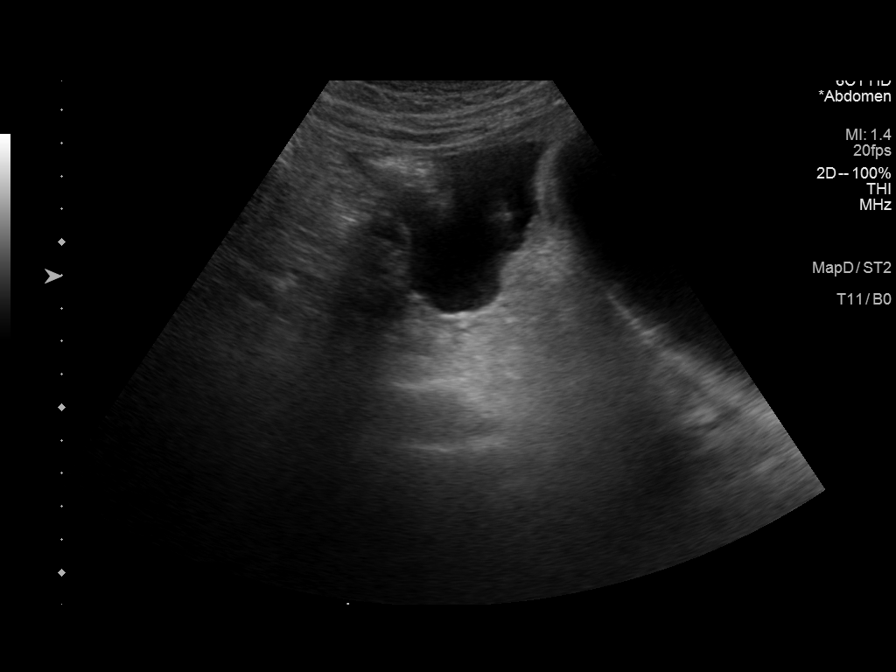
[im 8/9]
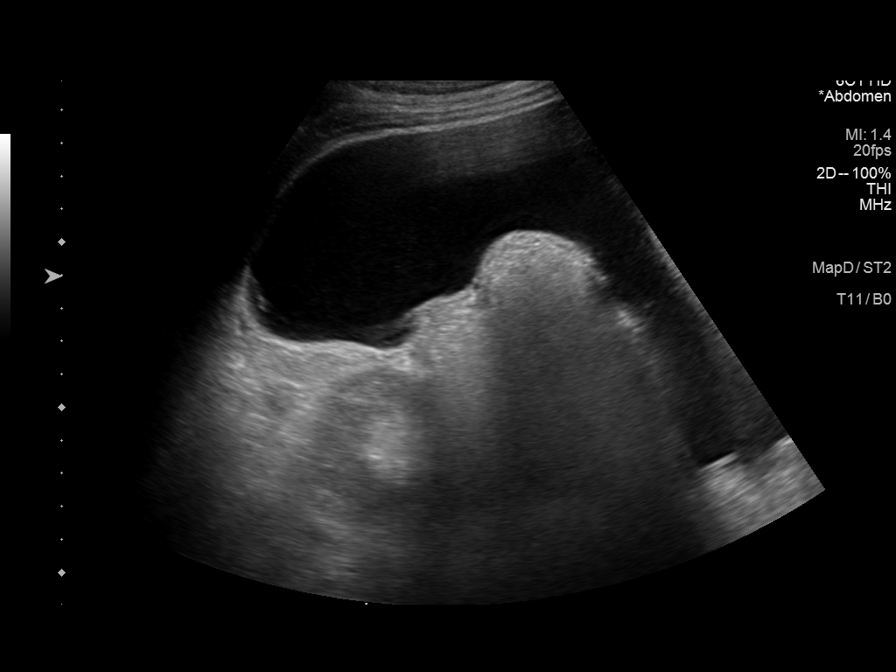
[im 9/9]
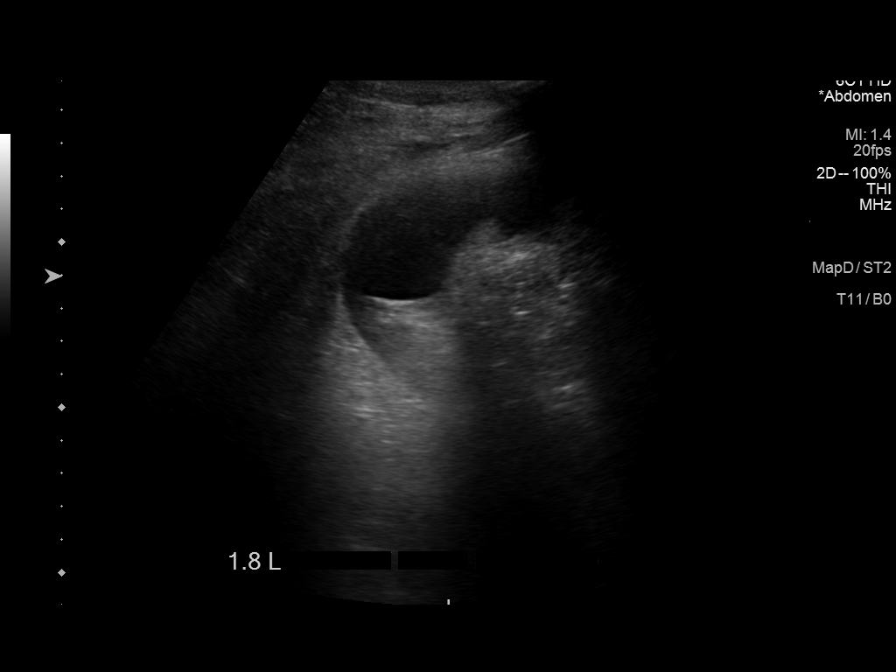

[9 of 9 positions shown; findings below may reference images not displayed]

PROCEDURE:
An ultrasound guided paracentesis was thoroughly discussed with the
patient and questions answered. The benefits, risks, alternatives
and complications were also discussed. The patient understands and
wishes to proceed with the procedure. Written consent was obtained.

Ultrasound was performed to localize and mark an adequate pocket of
fluid in the right lower quadrant of the abdomen. The area was then
prepped and draped in the normal sterile fashion. 1% Lidocaine was
used for local anesthesia. Under ultrasound guidance a
Safe-T-Centesis catheter was introduced. Paracentesis was performed.
The catheter was removed and a dressing applied.

COMPLICATIONS:
None immediate.
FINDINGS: A total of approximately 1.8 L of serous fluid was removed. A fluid
sample was not sent for laboratory analysis.
IMPRESSION: Successful ultrasound guided paracentesis yielding 1.8 L of ascites.

## 2016-03-06 IMAGING — CR DG CHEST 1V PORT
1 series · 1 of 1 positions shown · non-contrast
Comparison: September 20, 2015.

CLINICAL DATA: Syncope.

EXAM:
PORTABLE CHEST 1 VIEW

[ap]
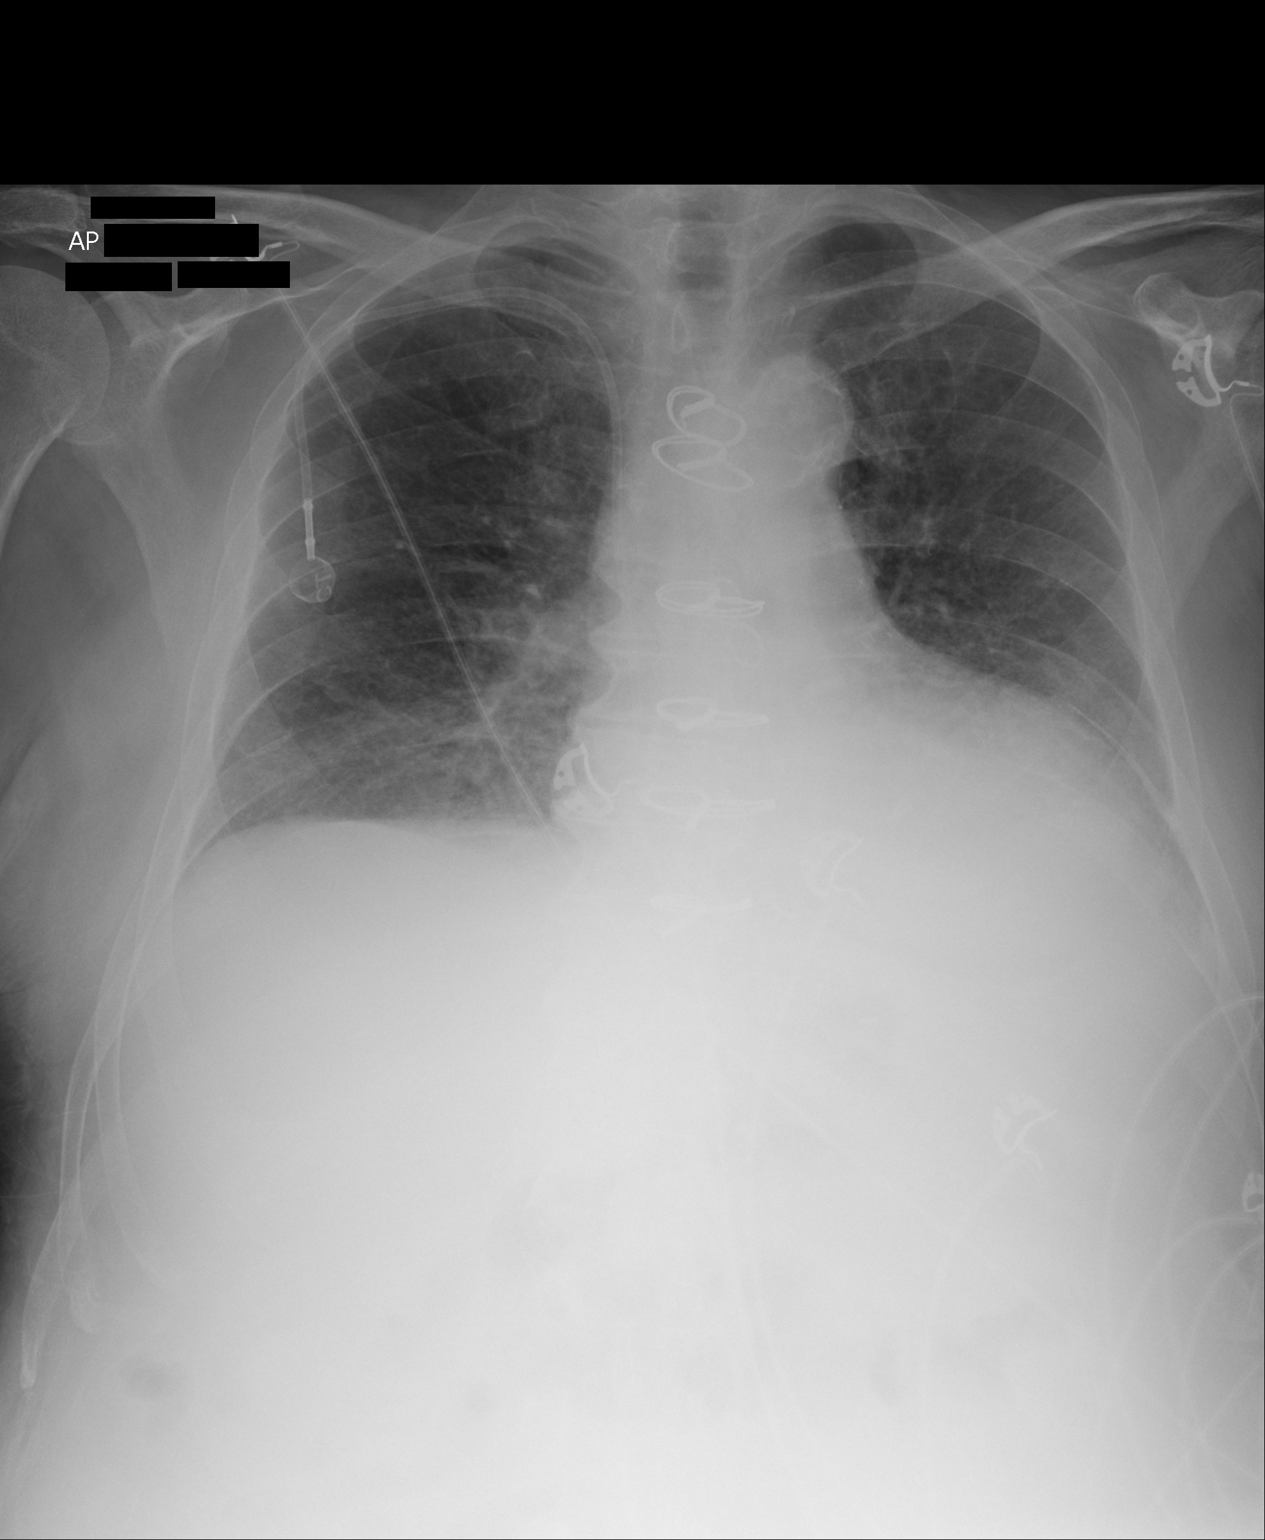

[1 of 1 positions shown; findings below may reference images not displayed]

FINDINGS: Stable cardiomegaly. Status post coronary artery bypass graft. Right
subclavian Port-A-Cath is noted with distal tip in the expected
position of cavoatrial junction. No pneumothorax is noted. Right
lung is clear. Left basilar opacity is noted concerning for
atelectasis with associated pleural effusion. Bony thorax is
unremarkable.
IMPRESSION: Left basilar opacity concerning for atelectasis with associated
pleural effusion.
